# Patient Record
Sex: Female | Born: 1935 | Race: White | Hispanic: No | Smoking: Never smoker
Health system: Southern US, Community
[De-identification: ages and names within clinical notes are randomized; demographics above are authoritative.]

## PROBLEM LIST (undated history)

## (undated) DIAGNOSIS — I499 Cardiac arrhythmia, unspecified: Secondary | ICD-10-CM

## (undated) DIAGNOSIS — Z8719 Personal history of other diseases of the digestive system: Secondary | ICD-10-CM

## (undated) DIAGNOSIS — M87052 Idiopathic aseptic necrosis of left femur: Secondary | ICD-10-CM

## (undated) DIAGNOSIS — D649 Anemia, unspecified: Secondary | ICD-10-CM

## (undated) DIAGNOSIS — E039 Hypothyroidism, unspecified: Secondary | ICD-10-CM

## (undated) DIAGNOSIS — I10 Essential (primary) hypertension: Secondary | ICD-10-CM

## (undated) DIAGNOSIS — M1990 Unspecified osteoarthritis, unspecified site: Secondary | ICD-10-CM

## (undated) DIAGNOSIS — C259 Malignant neoplasm of pancreas, unspecified: Secondary | ICD-10-CM

## (undated) DIAGNOSIS — Z9289 Personal history of other medical treatment: Secondary | ICD-10-CM

## (undated) DIAGNOSIS — I639 Cerebral infarction, unspecified: Secondary | ICD-10-CM

## (undated) HISTORY — PX: EYE SURGERY: SHX253

## (undated) HISTORY — DX: Idiopathic aseptic necrosis of left femur: M87.052

## (undated) HISTORY — PX: OTHER SURGICAL HISTORY: SHX169

## (undated) SURGERY — Surgical Case
Anesthesia: *Unknown

---

## 2001-06-05 DIAGNOSIS — I639 Cerebral infarction, unspecified: Secondary | ICD-10-CM

## 2001-06-05 HISTORY — DX: Cerebral infarction, unspecified: I63.9

## 2002-03-22 ENCOUNTER — Encounter: Payer: Self-pay | Admitting: *Deleted

## 2002-03-22 ENCOUNTER — Inpatient Hospital Stay (HOSPITAL_COMMUNITY): Admission: EM | Admit: 2002-03-22 | Discharge: 2002-03-26 | Payer: Self-pay | Admitting: *Deleted

## 2002-03-24 ENCOUNTER — Encounter: Payer: Self-pay | Admitting: *Deleted

## 2002-04-01 ENCOUNTER — Encounter (HOSPITAL_COMMUNITY): Admission: RE | Admit: 2002-04-01 | Discharge: 2002-05-01 | Payer: Self-pay | Admitting: Internal Medicine

## 2002-09-01 ENCOUNTER — Ambulatory Visit (HOSPITAL_COMMUNITY): Admission: RE | Admit: 2002-09-01 | Discharge: 2002-09-01 | Payer: Self-pay | Admitting: Ophthalmology

## 2005-01-27 ENCOUNTER — Emergency Department (HOSPITAL_COMMUNITY): Admission: EM | Admit: 2005-01-27 | Discharge: 2005-01-27 | Payer: Self-pay | Admitting: Emergency Medicine

## 2006-09-10 ENCOUNTER — Encounter (HOSPITAL_COMMUNITY): Admission: RE | Admit: 2006-09-10 | Discharge: 2006-10-10 | Payer: Self-pay | Admitting: Pulmonary Disease

## 2006-09-11 ENCOUNTER — Ambulatory Visit (HOSPITAL_COMMUNITY): Payer: Self-pay | Admitting: Pulmonary Disease

## 2006-10-16 ENCOUNTER — Ambulatory Visit: Payer: Self-pay | Admitting: Internal Medicine

## 2006-10-31 ENCOUNTER — Encounter: Payer: Self-pay | Admitting: Internal Medicine

## 2006-10-31 ENCOUNTER — Ambulatory Visit (HOSPITAL_COMMUNITY): Admission: RE | Admit: 2006-10-31 | Discharge: 2006-10-31 | Payer: Self-pay | Admitting: Internal Medicine

## 2006-10-31 ENCOUNTER — Ambulatory Visit: Payer: Self-pay | Admitting: Internal Medicine

## 2006-11-08 ENCOUNTER — Ambulatory Visit (HOSPITAL_COMMUNITY): Admission: RE | Admit: 2006-11-08 | Discharge: 2006-11-08 | Payer: Self-pay | Admitting: Internal Medicine

## 2006-11-16 ENCOUNTER — Ambulatory Visit: Payer: Self-pay | Admitting: Internal Medicine

## 2006-12-27 ENCOUNTER — Ambulatory Visit: Payer: Self-pay | Admitting: Internal Medicine

## 2007-06-14 ENCOUNTER — Ambulatory Visit (HOSPITAL_COMMUNITY): Admission: RE | Admit: 2007-06-14 | Discharge: 2007-06-14 | Payer: Self-pay | Admitting: Pulmonary Disease

## 2010-10-18 NOTE — Op Note (Signed)
NAME:  Regina [name], Regina [name]              ACCOUNT NO.:  [id number]   MEDICAL RECORD NO.:  [id number]          PATIENT TYPE:  AMB   LOCATION:  DAY                           FACILITY:  [facility]   PHYSICIAN:  R. Roetta Sessions, M.D. DATE OF BIRTH:  [birth year]   DATE OF PROCEDURE:  10/31/2006  DATE OF DISCHARGE:                               OPERATIVE REPORT   PROCEDURE:  Colonoscopy, biopsy followed by EGD diagnostic.   INDICATIONS FOR PROCEDURE:  77 year old lady with profound microcytic  anemia.  She is Hemoccult positive.  She has been transfused.  There is  been no melena or hematochezia.  She has not had any hematemesis.  Colonoscopy is now being done, if unremarkable EGD, will be performed.  This approach has discussed the patient at length.  Potential risks,  benefits and alternatives have been reviewed, questions answered.  Please see documentation medical record.   PROCEDURE NOTE:  O2 saturation, blood pressure, pulse and monitored  throughout the entire procedure.   CONSCIOUS SEDATION:  Versed 4 mg IV Demerol 100 mg IV in divided doses  for both procedures.   INSTRUMENTATION:  Pentax video chip system.   Digital rectal exam revealed no abnormalities.   ENDOSCOPIC FINDINGS:  The prep was adequate.  Colon:  Colonic mucosa was surveyed from rectosigmoid junction through  the left, transverse, right colon to area appendiceal orifice, ileocecal  valve and cecum.  These structures well seen photographed for the  record.  I attempted to intubate the terminal ileum was unable to do so.  From this level scope was slowly withdrawn.  All previous mentioned  mucosal surfaces were again seen.  The patient had a densely populated  diverticula from the rectosigmoid junction to the cecum.  She had  complex tics (tics within tics).  The scope was pulled down into the  rectum where thorough examination of rectal mucosa including retroflex  view anal verge revealed only internal hemorrhoids and  diminutive 3 mm  polyp at the rectosigmoid junction.  Remainder of the colonic mucosa and  rectal mucosa appeared normal.  This polyp was cold biopsy slash  removed.  The patient tolerated the procedure was prepared for upper  endoscopy.   FINDINGS:  EGD demonstrated normal esophageal mucosa.  EG junction  easily traversed.  Stomach:  Gastric cavity was emptied insufflated well with air.  Thorough examination gastric mucosa including retroflex view of the  proximal stomach esophagogastric junction demonstrated a very large  hiatal hernia only.  Pylorus patent, easily traversed.  Examination  bulb, second portion revealed no abnormalities.   THERAPEUTIC/DIAGNOSTIC MANEUVERS PERFORMED:  None.   The patient tolerated procedure well was reacted in endoscopy.   IMPRESSION:  Colonoscopy findings:  Internal hemorrhoids diminutive,  rectosigmoid polyp cold biopsy/removed.  The remainder rectal mucosa  appeared normal.  Densely populated pan colonic diverticula.  Remainder  colonic mucosa appeared normal.   EGD findings:  Normal esophagus, large large hiatal hernia otherwise  normal stomach D1, D2.   RECOMMENDATIONS:  Will proceed with a Givens capsule study of small  bowel near future.  Further recommendations to follow.  Jonathon Bellows, M.D.  Electronically Signed     RMR/MEDQ  D:  10/31/2006  T:  10/31/2006  Job:  [id number]   cc:   Ramon Dredge L. Juanetta Gosling, M.D.  Fax: [phone]

## 2010-10-18 NOTE — Op Note (Signed)
NAME:  Regina [name], Regina [name]              ACCOUNT NO.:  [id number]   MEDICAL RECORD NO.:  [id number]          PATIENT TYPE:  AMB   LOCATION:  DAY                           FACILITY:  [facility]   PHYSICIAN:  R. Roetta Sessions, M.D. DATE OF BIRTH:  1922/09/08   DATE OF PROCEDURE:  11/08/2006  DATE OF DISCHARGE:                                PROCEDURE NOTE   PROCEDURE:  Small bowel Given capsule endoscopy.   REQUESTING PHYSICIAN:  R. Roetta Sessions, M.D.   INDICATIONS FOR PROCEDURE:  The patient is an 77 year old female with  profound microcytic anemia and Hemoccult positive stools requiring  transfusion.  There was no history of melena, hematochezia or  hematemesis.  She underwent colonoscopy and EGD by Dr. Jena Gauss on  10/31/2006.  She was found to have internal hemorrhoids which were  diminutive, a rectosigmoid polyp which was cold biopsied and  hyperplastic.  The remainder of the rectal mucosa appeared normal.  She  had densely populated pancolonic diverticula and the remainder of the  colonic mucosa appeared normal.  EGD showed a normal esophagus, large  hiatal hernia, otherwise normal D1 and T2.  She is undergoing further  evaluation with Given capsule study to determine etiology of her iron  deficiency anemia and Hemoccult positive stool.   FINDINGS:  Gastric passage time was 31 minutes with first duodenal image  at 32 minutes 16 seconds.  At 32 minutes 23 seconds she is found to have  multiple lymphoid aggregates at the duodenal bulb.  At 32 minutes 29  seconds, lymphoid aggregates with small amount of erythema, no active  bleeding.  At 2 hours 8 minutes 21 seconds, she was noted to have  lymphangiectasia.  Again at 2 hours 29 minutes 52 seconds, 2 hours 31  minutes 15 seconds, 2 hours 52 minutes, 2 hours 56 minutes, 3 hours 16  minutes.  First ileocecal valve image is 5 hours 5 minutes 34 seconds,  and first cecal image is at 5 hours 14 minutes 18 seconds.  Total small  bowel passage time  was 4 hours 42 minutes.  There was no evidence of  active GI bleeding on the study.   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  Multiple lymphangiectasia  throughout the small bowel.  Otherwise normal appearing mucosa.  These  findings are unlikely to explain her iron deficiency anemia and  Hemoccult positive stool.  Portions of this small bowel study were  reviewed with Dr. Jena Gauss.   PLAN:  1. Would recheck CBC to see where we stand as far as her anemia is      concerned.  2. Followup office visit with [place] in 4 to 6 weeks.  3. Would begin iron supplementation in the form of ferrous sulfate      once daily if she remains with significant anemia.      Lorenza Burton, N.P.      Jonathon Bellows, M.D.  Electronically Signed    KJ/MEDQ  D:  11/16/2006  T:  11/16/2006  Job:  [id number]   cc:  Edward L. Luan Pulling, M.D.  Fax: [phone]

## 2010-10-18 NOTE — H&P (Signed)
NAME:  Regina, [name]              ACCOUNT NO.:  [id number]   MEDICAL RECORD NO.:  [id number]           PATIENT TYPE:  AMB   LOCATION:                                FACILITY:  [facility]   PHYSICIAN:  R. Roetta Sessions, M.D. DATE OF BIRTH:  May 04, 1923   DATE OF ADMISSION:  DATE OF DISCHARGE:  LH                              HISTORY & PHYSICAL   REASON FOR CONSULTATION:  Iron deficiency anemia.   [name] is a pleasant 77 year old Caucasian female sent over  at the courtesy of Dr. Shaune Pollack to further evaluate profound iron  deficiency anemia.  Apparently during recent routine evaluation she was  found to be anemic.  From April 7 she was found to have an H&H of 7.9  and 23.6 with an MCV of 59.3, platelet count 372.  She has received 1  unit of packed red blood cells since that time and feels much better.  She was feeling progressively fatigued.  Iron studies revealed an iron-  binding capacity that was not calculable.  Her serum iron level was less  than 10, her ferritin was 5, B12 level was 467, folate level 15.1.  Ms.  Regina [name] has not had any clinical evidence of GI bleeding.  She has had no  hematochezia, melena, no change in bowel habit.  She had some vague  upper abdominal pain for 3 days in 2022/08/01 which passed on its own.  Has not had any reflux symptoms,odynophagia, dysphagia, early satiety,  nausea, vomiting or weight loss.  She had been on Plavix for years.  This agent was recently.  She had been on Fosamax since the first of the  year.  This agent has now also been stopped.  She has never had her  lower GI tract imaged.  Her brother died with stomach cancer.   PAST MEDICAL HISTORY:  Significant for a stroke in 2003, hypertension,  hyperlipidemia.   PAST SURGERIES:  Cataract surgery.   CURRENT MEDICATIONS:  Norvasc and Toprol-XL, doses unknown.   ALLERGIES:  No known drug allergies.   FAMILY HISTORY:  Mother and father succumbed to heart disease.  Brother  succumbed to stomach cancer.   SOCIAL HISTORY:  The patient is married.  She lives on [address] in  [place].  She has two children in good health.  She is a retired  housewife.  No tobacco.  Occasionally uses alcohol socially.  No illicit  drugs.   REVIEW OF SYSTEMS:  No chest pain.  Has had some dyspnea and fatigued  improved with the packed red blood cell transfusion recently.  Has not  had any fever or chills.   PHYSICAL EXAMINATION:  GENERAL:  Reveals a pleasant, somewhat pale 83-  year lady resting comfortably.  VITAL SIGNS:  Weight 155, height 5 feet 5 inches, temperature 97.8, BP  142/80, pulse 58.  SKIN:  Warm and dry.  There is no jaundice or cutaneous stigmata of  chronic liver disease.  HEENT:  No scleral icterus.  Conjunctivae are somewhat pale.  Oral  cavity:  No lesions.  CHEST:  Lungs are  clear to auscultation.  CARDIAC:  Regular rate and rhythm without murmur, gallop rub.  BREAST:  Exam is deferred.  ABDOMEN:  Somewhat obese, positive bowel sounds, soft, entirely  nontender without appreciable mass or organomegaly.  EXTREMITIES:  No edema.  RECTAL:  Good sphincter tone.  No mass in the rectal vault.  Brown stool  in the rectal vault is strongly Hemoccult positive.   IMPRESSION:  [name] is a pleasant 77 year old lady who  appears have a profound microcytic anemia.  She is hemoccult positive.  This nice lady needs to have a colonoscopy as soon as can be arranged.  I am concerned she could have an occult neoplasm in her colon producing  the clinical scenario.   It is certainly possible that Fosamax added to her preexisting Plavix  therapy could have produced mucosal insult in her upper GI tract which  could be contributing to the clinical picture, but aside from 3 days of  self-limiting abdominal pain back in February of this year she really  has not had any symptoms referable to the upper GI tract.   I told [name] we ought to go ahead and do  a colonoscopy.  The  risks, benefits and alternatives have been reviewed, questions answered.  She is agreeable.  I told her if we do not find a cause for iron  deficiency anemia and Hemoccult positive stool on colonoscopy we will  then perform an EGD at the same time.  Potential risks, benefits and  alternative of this approach have been reviewed and she is also  agreeable.  Will make further recommendations in the very near future.   I would like to thank Dr. Shaune Pollack for allowing me to see this very  nice lady today.      Jonathon Bellows, M.D.  Electronically Signed     RMR/MEDQ  D:  10/16/2006  T:  10/16/2006  Job:  [id number]   cc:   Ramon Dredge L. Juanetta Gosling, M.D.  Fax: [phone]

## 2010-10-18 NOTE — Assessment & Plan Note (Signed)
NAME:  KRISTA, GODSIL               CHART#:  [id number]   DATE:  12/27/2006                       DOB:  1922-08-16   CHIEF COMPLAINT:  Followup anemia.   SUBJECTIVE:  Ms. Hartinger is an 77 year old female who has a history of  profound microcytic anemia and Hemoccult positive stools requiring  transfusions. She underwent GIVENS capsule endoscopy on November 08, 2006.  She was found to have multiple lymphangiectasias, but otherwise normal  appearing mucosa. There were no findings likely to explain her iron  deficiency anemia and Hemoccult positive stool. She had a repeat CBC  done on November 20, 2006. The hemoglobin was up to 11.3 with a hematocrit  of 36.5. She tells me she is feeling fine. She denies any fatigue. She  denies any shortness of breath, denies any melena, or rectal bleeding.  She has been off Plavix since September 08, 2006.   CURRENT MEDICATIONS:  See the list from December 27, 2006.   ALLERGIES:  No known drug allergies.   OBJECTIVE:  VITAL SIGNS: Weight 156 pounds, height 65 inches,  temperature 97.9, blood pressure 140/82, pulse 56.  GENERAL: Ms. Bellavance is a well-developed, well-nourished elderly female  in no acute distress.  HEENT: Throat clear, eyes anicteric, conjunctivae pink. Oropharynx pink  and moist without any lesions.  CHEST/HEART: Regular rate and rhythm, normal S1, S2.  ABDOMEN: Positive bowel sounds x4, no bruits auscultated. Soft,  nontender, nondistended, without palpable mass, or hepatosplenomegaly.  No rebound, tenderness, or guarding.  EXTREMITIES: Without clubbing or edema bilaterally.   ASSESSMENT:  Ms. Tiedt is an 77 year old female with history of  profound microcytic anemia and Hemoccult positive stools requiring  transfusion. She had an EGD and colonoscopy by Dr. Jena Gauss October 01, 2006.  She had pancolonic diverticula, a large hiatal hernia, and hemorrhoids.  GIVENS capsule study was benign and no evidence of bleeding was  observed. She had previously  been on Plavix which may have led to  intermittent bleeding.  Since she has been off, the work-up has been  benign. I have discussed this case with Dr. Jena Gauss.   PLAN:  We will recheck her hemoglobin in 4 weeks.  If it is normal we will see if Dr. Juanetta Gosling wants to resume Plavix and he  can determine dosing.  She will need to be continued to be monitored to be sure that she is  stable while she is on Plavix.       Lorenza Burton, N.P.  Electronically Signed     R. Roetta Sessions, M.D.  Electronically Signed    KJ/MEDQ  D:  12/28/2006  T:  12/28/2006  Job:  [id number]   cc:   Ramon Dredge L. Juanetta Gosling, M.D.

## 2010-10-21 NOTE — Discharge Summary (Signed)
   NAME:  Regina [name], Regina [name]                        ACCOUNT NO.:  [id number]   MEDICAL RECORD NO.:  [id number]                   PATIENT TYPE:  INP   LOCATION:  A213                                 FACILITY:  [facility]   PHYSICIAN:  Cristy Hilts. Jacinto Halim, M.D.                  DATE OF BIRTH:  02-25-1923   DATE OF ADMISSION:  03/22/2002  DATE OF DISCHARGE:                                 DISCHARGE SUMMARY   PROCEDURE PERFORMED:  Doppler echocardiogram.   INDICATION:  Stroke and hypertension.   FINDINGS:  Aortic valve 2.7, left atrium 4.0, septum 1.4, posterior 1.3,  left ventricular diastolic dimension 3.2, left ventricular systolic  dimension 2.7, right ventricular dimension normal, IVC normal, ejection  fraction 60%.   LEFT ATRIUM:  The left atrium shows mild atrial enlargement.   LEFT VENTRICLE:  The left ventricle shows moderate concentric left  ventricular hypertrophy.   RIGHT ATRIUM:  The right atrium is normal.  A prominent eustachian wall  valve is noted, impact is normal structure.   RIGHT VENTRICLE:  The right ventricle is normal.   PERICARDIUM:  The pericardium is normal.   AORTIC VALVE:  The aortic valve is normal.   MITRAL VALVE:  The mitral valve is normal.  The is mild mitral valve  calcification.  There is trivial mitral valve regurgitation.   PULMONARY VALVE:  The pulmonary valve is normal.  There is trivial pulmonary  valve regurgitation.   TRICUSPID VALVE:  The tricuspid valve is normal.  There is mild tricuspid  valve regurgitation.  The pulmonary systolic pressure is estimated at 31  mm/Hg.    FINDINGS:  1. Normal left ventricular systolic function, ejection fraction 60%.  2. Moderate concentric left ventricular hypertrophy.  3. Mild pulmonary hypertension with pulmonary systolic pressure estimated at     31 mm/Hg.                                               Cristy Hilts. Jacinto Halim, M.D.    Pilar Plate  D:  03/24/2002  T:  03/25/2002  Job:  [id number]   cc:   Gracelyn Nurse, M.D.   [facility]

## 2010-10-21 NOTE — Discharge Summary (Signed)
   NAME:  Regina [name], Regina [name]                        ACCOUNT NO.:  [id number]   MEDICAL RECORD NO.:  [id number]                   PATIENT TYPE:  INP   LOCATION:  A213                                 FACILITY:  [facility]   PHYSICIAN:  Hanley Hays. Dechurch, M.D.           DATE OF BIRTH:  Nov 02, 1922   DATE OF ADMISSION:  03/22/2002  DATE OF DISCHARGE:  03/26/2002                                 DISCHARGE SUMMARY   DIAGNOSES:  1. Left corona radiata subacute infarction; residual right hemiparesis,     upper extremity greater than lower extremity, and mild dysarthria.  2. Hypertension, uncontrolled.  3. Urinary tract infection.  4. Small vessel disease.  5. Obesity.  6. Probable diabetes.  7. Cholesterol 241.   CONDITION:  Improved.   FOLLOW UP:  Follow up Dr. Milus Glazier October 28.  Outpatient OT, PT, speech  also arranged for the patient.   MEDICATIONS:  1. Norvasc 10 daily.  2. Aspirin 325 daily.  3. Plavix 75 daily.  4. Toprol XL 50 daily.   HOSPITAL COURSE:  A 77 year old white female who presented to the hospital  with right hemiparesis and dysarthria.  She was also noted to be  hypertensive.  She had not been on any medications.  She did not have a  regular physician.  She had not seen a physician since 1984 when she  fractured her ankle.  The patient's CT scan revealed no acute findings but  MR did reveal a subacute corona radiata infarction.  The issues were  discussed at length with the patient regarding need to control her chronic  medical problems in order to prevent further events.  She seemed to have a  reasonable understanding and followup was arranged for the patient.  The  patient was in improved condition at the time of hospital discharge.  Dysarthria was barely discernible at this time.  She had no  evidence of aphagia.  She had some mild right upper extremity greater than  lower extremity weakness.  This too had improved.  There were no other  significant findings.   Blood pressures were well controlled in the 140s and  further management would be continued as an outpatient.                                               Hanley Hays Josefine Class, M.D.    FED/MEDQ  D:  05/30/2002  T:  05/31/2002  Job:  [id number]   cc:   Hanley Hays. Dechurch, M.D.  [address]  [place]  [place] [address]  Fax: [phone]

## 2010-10-21 NOTE — H&P (Signed)
NAME:  Regina [name], Regina [name]                        ACCOUNT NO.:  [id number]   MEDICAL RECORD NO.:  [id number]                   PATIENT TYPE:  INP   LOCATION:  IC09                                 FACILITY:  [facility]   PHYSICIAN:  Sarita Bottom, M.D.                  DATE OF BIRTH:  01-03-1923   DATE OF ADMISSION:  03/22/2002  DATE OF DISCHARGE:                                HISTORY & PHYSICAL   REASON FOR ADMISSION:  Ischemic cerebrovascular accident, uncontrolled  hypertension, urinary tract infection..   CHIEF COMPLAINT:  My speech is slurred.   HISTORY OF PRESENT ILLNESS:  The patient is a 77 year old lady with no known  medical problems.  Was apparently well until two days ago when she noticed  general malaise.  She also felt weakness in the right arm the day after  that, and she developed a fever, and she also noticed slurring of her speech  about 11 a.m. this morning.  After that she took a tablet of aspirin and  decided to come to the emergency room of [facility] for evaluation.   REVIEW OF SYSTEMS:  GENERAL:  The patient admits to malaise.  Admits to  feeling feverish.  RESPIRATORY:  She denies any cough.  CARDIOVASCULAR:  Admits to palpitations sometimes.  ABDOMEN:  Denies any diarrhea or  constipation.  GENITOURINARY:  She denies any dysuria.  CNS:  She admits to  having right-sided weakness.  Denies dizziness.  MUSCULOSKELETAL:  Denies  any back pain.  EXTREMITIES:  Denies any edema.   PAST MEDICAL HISTORY:  Ankle fracture in 1984, and she has not seen an M.D.  since 1984.   MEDICATIONS:  She is not on any long-term medications.  She took a tablet of  aspirin today.   ALLERGIES:  No known drug allergies.   FAMILY HISTORY:  Significant for CHF in both parents and a CVA in her  mother.   SOCIAL HISTORY:  She is married, with two grown-up children.  She lives with  her husband.  She does not smoke.  She stopped smoking so many years ago.  She drinks wine  frequently with dinner.   PHYSICAL EXAMINATION:  VITAL SIGNS:  BP was 188/74 with a heart rate of 77,  temperature afebrile, oxygen saturation on room air was 97%.  GENERAL:  Elderly lady sitting up on a stretcher, not in any apparent  distress.  HEENT:  The patient is not pale.  Anicteric.  Pupils are equal and reactive  to light and accommodation.  She has flattening of the right nasolabial  fold.  NECK:  Supple.  No thyromegaly.  No carotid bruits are appreciated.  CHEST:  Air entry was adequate bilaterally.  No wheezes or crepitations were  heard.  CARDIOVASCULAR:  Heart sounds 1 and 2 were heard.  Regular rhythm.  No  murmurs were appreciated.  ABDOMEN:  Soft and nontender.  No masses or organomegaly.  CNS:  Alert and oriented x 3.  She has normal tone in all extremities.  The  power is 5/5 in the left upper and left lower extremities, about 3+/5 in her  right upper extremity, and 3/5 in her right lower extremity.  The reflexes  appear to be depressed on the right side.  Her sensation was intact.  Cranial nerves were intact.  EXTREMITIES:  She did not have any pedal edema.   LABORATORY DATA:  Head CT showed no acute bleed.  Showed atrophy with mild  changes of small-vessel disease.   WBC 8.1 with normal differential, hematocrit of 41, hemoglobin of 13.8.  PT  4.7, PTT 27, INR of 1.0.  Sodium of 137, potassium of 4.1, chloride of 103,  CO2 of 27, BUN of 12, creatinine of 1.0, glucose of 117, calcium of 9.  Cardiac enzymes showed a CK of 115, MB fraction of 2.8, troponin of 0.01.  Urine was significant for leukocyte esterase, wbc's of 7-10, rbc's of 3-6,  protein positive.   EKG was normal sinus rhythm at 79 beats per minute, leftward axis.  Multiple  PVCs were seen.  Normal interval.  No acute ST or T-wave changes.   ASSESSMENT:  Ischemic cerebrovascular accident.  The patient is admitted to  intensive care unit for observation.  The patient is not eligible for  thrombolytic  therapy at this time.  Because of that ____________.  Also,  anticoagulation cannot be given because of the uncontrolled hypertension.  The patient will be started on aspirin 325 mg p.o. q.d., Plavix 75 mg q.d.  Carotid Doppler bilaterally will be done, and a 2-D echo was to be done.  A  neurological consult will be sought for evaluation.  For the uncontrolled  hypertension the patient will be treated with metoprolol 50 mg b.i.d.  For  the urinary tract infection the patient will be started on Bactrim 1 double-  strength tablet b.i.d. for five to seven days.  Further workup will depend  on the patient's clinical course.                                               Sarita Bottom, M.D.    DW/MEDQ  D:  03/22/2002  T:  03/23/2002  Job:  [id number]

## 2010-10-21 NOTE — Procedures (Signed)
   NAME:  Regina [name], Regina [name]                        ACCOUNT NO.:  [id number]   MEDICAL RECORD NO.:  [id number]                   PATIENT TYPE:  INP   LOCATION:  A213                                 FACILITY:  [facility]   PHYSICIAN:  Edward L. Juanetta Gosling, M.D.             DATE OF BIRTH:  Dec 30, 1922   DATE OF PROCEDURE:  03/22/2002  DATE OF DISCHARGE:                                EKG INTERPRETATION   TIME/DATE:  1730, March 22, 2002.   DESCRIPTION OF PROCEDURE:  The rhythm is a sinus rhythm with a rate in the  80s.  There are multiple PVCs.  There are diffuse nonspecific T-wave  abnormalities.   IMPRESSION:  Abnormal electrocardiogram.                                                Edward L. Juanetta Gosling, M.D.    ELH/MEDQ  D:  03/24/2002  T:  03/24/2002  Job:  [id number]

## 2011-07-20 DIAGNOSIS — I6789 Other cerebrovascular disease: Secondary | ICD-10-CM | POA: Diagnosis not present

## 2011-07-20 DIAGNOSIS — E785 Hyperlipidemia, unspecified: Secondary | ICD-10-CM | POA: Diagnosis not present

## 2011-07-20 DIAGNOSIS — D649 Anemia, unspecified: Secondary | ICD-10-CM | POA: Diagnosis not present

## 2011-07-20 DIAGNOSIS — I10 Essential (primary) hypertension: Secondary | ICD-10-CM | POA: Diagnosis not present

## 2012-01-17 DIAGNOSIS — D649 Anemia, unspecified: Secondary | ICD-10-CM | POA: Diagnosis not present

## 2012-01-17 DIAGNOSIS — I10 Essential (primary) hypertension: Secondary | ICD-10-CM | POA: Diagnosis not present

## 2012-01-17 DIAGNOSIS — I6789 Other cerebrovascular disease: Secondary | ICD-10-CM | POA: Diagnosis not present

## 2012-01-17 DIAGNOSIS — H6120 Impacted cerumen, unspecified ear: Secondary | ICD-10-CM | POA: Diagnosis not present

## 2012-08-02 DIAGNOSIS — E785 Hyperlipidemia, unspecified: Secondary | ICD-10-CM | POA: Diagnosis not present

## 2012-08-02 DIAGNOSIS — I10 Essential (primary) hypertension: Secondary | ICD-10-CM | POA: Diagnosis not present

## 2012-08-02 DIAGNOSIS — G459 Transient cerebral ischemic attack, unspecified: Secondary | ICD-10-CM | POA: Diagnosis not present

## 2012-08-06 ENCOUNTER — Other Ambulatory Visit (HOSPITAL_COMMUNITY): Payer: Self-pay | Admitting: Pulmonary Disease

## 2012-08-06 ENCOUNTER — Ambulatory Visit (HOSPITAL_COMMUNITY)
Admission: RE | Admit: 2012-08-06 | Discharge: 2012-08-06 | Disposition: A | Discharge status: Home / Self Care | Payer: Medicare Other | Source: Ambulatory Visit | Attending: Pulmonary Disease | Admitting: Pulmonary Disease

## 2012-08-06 DIAGNOSIS — I700 Atherosclerosis of aorta: Secondary | ICD-10-CM | POA: Diagnosis not present

## 2012-08-06 DIAGNOSIS — K449 Diaphragmatic hernia without obstruction or gangrene: Secondary | ICD-10-CM | POA: Diagnosis not present

## 2012-08-06 DIAGNOSIS — J449 Chronic obstructive pulmonary disease, unspecified: Secondary | ICD-10-CM | POA: Diagnosis not present

## 2012-08-06 DIAGNOSIS — R509 Fever, unspecified: Secondary | ICD-10-CM

## 2012-08-06 DIAGNOSIS — J9819 Other pulmonary collapse: Secondary | ICD-10-CM | POA: Diagnosis not present

## 2012-08-06 DIAGNOSIS — Z8673 Personal history of transient ischemic attack (TIA), and cerebral infarction without residual deficits: Secondary | ICD-10-CM | POA: Insufficient documentation

## 2012-08-06 DIAGNOSIS — J4489 Other specified chronic obstructive pulmonary disease: Secondary | ICD-10-CM | POA: Insufficient documentation

## 2012-08-06 DIAGNOSIS — R110 Nausea: Secondary | ICD-10-CM | POA: Insufficient documentation

## 2012-08-06 DIAGNOSIS — J438 Other emphysema: Secondary | ICD-10-CM | POA: Diagnosis not present

## 2012-08-29 DIAGNOSIS — D649 Anemia, unspecified: Secondary | ICD-10-CM | POA: Diagnosis not present

## 2012-08-29 DIAGNOSIS — I10 Essential (primary) hypertension: Secondary | ICD-10-CM | POA: Diagnosis not present

## 2012-08-29 DIAGNOSIS — R6889 Other general symptoms and signs: Secondary | ICD-10-CM | POA: Diagnosis not present

## 2012-08-29 DIAGNOSIS — R634 Abnormal weight loss: Secondary | ICD-10-CM | POA: Diagnosis not present

## 2012-09-03 DIAGNOSIS — I499 Cardiac arrhythmia, unspecified: Secondary | ICD-10-CM

## 2012-09-03 HISTORY — DX: Cardiac arrhythmia, unspecified: I49.9

## 2012-09-05 ENCOUNTER — Other Ambulatory Visit (HOSPITAL_COMMUNITY): Payer: Self-pay | Admitting: Pulmonary Disease

## 2012-09-05 DIAGNOSIS — R945 Abnormal results of liver function studies: Secondary | ICD-10-CM

## 2012-09-05 DIAGNOSIS — R7989 Other specified abnormal findings of blood chemistry: Secondary | ICD-10-CM | POA: Diagnosis not present

## 2012-09-05 DIAGNOSIS — E039 Hypothyroidism, unspecified: Secondary | ICD-10-CM | POA: Diagnosis not present

## 2012-09-05 DIAGNOSIS — E875 Hyperkalemia: Secondary | ICD-10-CM | POA: Diagnosis not present

## 2012-09-10 ENCOUNTER — Ambulatory Visit (HOSPITAL_COMMUNITY)
Admission: RE | Admit: 2012-09-10 | Discharge: 2012-09-10 | Disposition: A | Discharge status: Home / Self Care | Payer: Medicare Other | Source: Ambulatory Visit | Attending: Pulmonary Disease | Admitting: Pulmonary Disease

## 2012-09-10 DIAGNOSIS — R945 Abnormal results of liver function studies: Secondary | ICD-10-CM

## 2012-09-10 DIAGNOSIS — K8020 Calculus of gallbladder without cholecystitis without obstruction: Secondary | ICD-10-CM | POA: Insufficient documentation

## 2012-09-10 DIAGNOSIS — R933 Abnormal findings on diagnostic imaging of other parts of digestive tract: Secondary | ICD-10-CM | POA: Insufficient documentation

## 2012-09-10 DIAGNOSIS — R748 Abnormal levels of other serum enzymes: Secondary | ICD-10-CM | POA: Insufficient documentation

## 2012-09-10 DIAGNOSIS — K838 Other specified diseases of biliary tract: Secondary | ICD-10-CM | POA: Insufficient documentation

## 2012-09-12 ENCOUNTER — Encounter (HOSPITAL_COMMUNITY): Payer: Self-pay | Admitting: Emergency Medicine

## 2012-09-12 ENCOUNTER — Inpatient Hospital Stay (HOSPITAL_COMMUNITY)
Admission: EM | Admit: 2012-09-12 | Discharge: 2012-09-18 | DRG: 481 | Disposition: A | Discharge status: Skilled Nursing Facility | Payer: Medicare Other | Attending: Pulmonary Disease | Admitting: Pulmonary Disease

## 2012-09-12 ENCOUNTER — Emergency Department (HOSPITAL_COMMUNITY): Payer: Medicare Other

## 2012-09-12 DIAGNOSIS — R6889 Other general symptoms and signs: Secondary | ICD-10-CM | POA: Diagnosis not present

## 2012-09-12 DIAGNOSIS — S72009A Fracture of unspecified part of neck of unspecified femur, initial encounter for closed fracture: Principal | ICD-10-CM | POA: Diagnosis present

## 2012-09-12 DIAGNOSIS — D509 Iron deficiency anemia, unspecified: Secondary | ICD-10-CM | POA: Diagnosis present

## 2012-09-12 DIAGNOSIS — Y92009 Unspecified place in unspecified non-institutional (private) residence as the place of occurrence of the external cause: Secondary | ICD-10-CM

## 2012-09-12 DIAGNOSIS — I4891 Unspecified atrial fibrillation: Secondary | ICD-10-CM | POA: Diagnosis not present

## 2012-09-12 DIAGNOSIS — M25559 Pain in unspecified hip: Secondary | ICD-10-CM | POA: Diagnosis not present

## 2012-09-12 DIAGNOSIS — E039 Hypothyroidism, unspecified: Secondary | ICD-10-CM | POA: Diagnosis present

## 2012-09-12 DIAGNOSIS — E119 Type 2 diabetes mellitus without complications: Secondary | ICD-10-CM | POA: Diagnosis not present

## 2012-09-12 DIAGNOSIS — Z8673 Personal history of transient ischemic attack (TIA), and cerebral infarction without residual deficits: Secondary | ICD-10-CM

## 2012-09-12 DIAGNOSIS — Z9181 History of falling: Secondary | ICD-10-CM | POA: Diagnosis not present

## 2012-09-12 DIAGNOSIS — S298XXA Other specified injuries of thorax, initial encounter: Secondary | ICD-10-CM | POA: Diagnosis not present

## 2012-09-12 DIAGNOSIS — Z800 Family history of malignant neoplasm of digestive organs: Secondary | ICD-10-CM | POA: Diagnosis not present

## 2012-09-12 DIAGNOSIS — I10 Essential (primary) hypertension: Secondary | ICD-10-CM | POA: Diagnosis not present

## 2012-09-12 DIAGNOSIS — R262 Difficulty in walking, not elsewhere classified: Secondary | ICD-10-CM | POA: Diagnosis not present

## 2012-09-12 DIAGNOSIS — S72009D Fracture of unspecified part of neck of unspecified femur, subsequent encounter for closed fracture with routine healing: Secondary | ICD-10-CM | POA: Diagnosis not present

## 2012-09-12 DIAGNOSIS — I4892 Unspecified atrial flutter: Secondary | ICD-10-CM | POA: Diagnosis not present

## 2012-09-12 DIAGNOSIS — E876 Hypokalemia: Secondary | ICD-10-CM | POA: Diagnosis present

## 2012-09-12 DIAGNOSIS — R7309 Other abnormal glucose: Secondary | ICD-10-CM | POA: Diagnosis present

## 2012-09-12 DIAGNOSIS — S72002A Fracture of unspecified part of neck of left femur, initial encounter for closed fracture: Secondary | ICD-10-CM

## 2012-09-12 DIAGNOSIS — M6281 Muscle weakness (generalized): Secondary | ICD-10-CM | POA: Diagnosis not present

## 2012-09-12 DIAGNOSIS — Z79899 Other long term (current) drug therapy: Secondary | ICD-10-CM | POA: Diagnosis not present

## 2012-09-12 DIAGNOSIS — R279 Unspecified lack of coordination: Secondary | ICD-10-CM | POA: Diagnosis not present

## 2012-09-12 DIAGNOSIS — Z8249 Family history of ischemic heart disease and other diseases of the circulatory system: Secondary | ICD-10-CM

## 2012-09-12 DIAGNOSIS — W010XXA Fall on same level from slipping, tripping and stumbling without subsequent striking against object, initial encounter: Secondary | ICD-10-CM | POA: Diagnosis present

## 2012-09-12 DIAGNOSIS — I484 Atypical atrial flutter: Secondary | ICD-10-CM

## 2012-09-12 DIAGNOSIS — I493 Ventricular premature depolarization: Secondary | ICD-10-CM

## 2012-09-12 DIAGNOSIS — I369 Nonrheumatic tricuspid valve disorder, unspecified: Secondary | ICD-10-CM | POA: Diagnosis not present

## 2012-09-12 DIAGNOSIS — R739 Hyperglycemia, unspecified: Secondary | ICD-10-CM

## 2012-09-12 DIAGNOSIS — I4949 Other premature depolarization: Secondary | ICD-10-CM | POA: Diagnosis not present

## 2012-09-12 HISTORY — DX: Hypothyroidism, unspecified: E03.9

## 2012-09-12 HISTORY — DX: Cerebral infarction, unspecified: I63.9

## 2012-09-12 HISTORY — DX: Essential (primary) hypertension: I10

## 2012-09-12 LAB — CBC WITH DIFFERENTIAL/PLATELET
Basophils Absolute: 0 10*3/uL (ref 0.0–0.1)
Basophils Relative: 0 % (ref 0–1)
Eosinophils Absolute: 0 10*3/uL (ref 0.0–0.7)
Eosinophils Relative: 0 % (ref 0–5)
HCT: 37.9 % (ref 36.0–46.0)
MCHC: 33.5 g/dL (ref 30.0–36.0)
MCV: 89.6 fL (ref 78.0–100.0)
Monocytes Absolute: 0.6 10*3/uL (ref 0.1–1.0)
Neutro Abs: 7.3 10*3/uL (ref 1.7–7.7)
RDW: 15.2 % (ref 11.5–15.5)

## 2012-09-12 LAB — HEPATIC FUNCTION PANEL
AST: 31 U/L (ref 0–37)
Albumin: 2.9 g/dL — ABNORMAL LOW (ref 3.5–5.2)
Total Bilirubin: 1.1 mg/dL (ref 0.3–1.2)
Total Protein: 7 g/dL (ref 6.0–8.3)

## 2012-09-12 LAB — URINALYSIS, ROUTINE W REFLEX MICROSCOPIC
Glucose, UA: NEGATIVE mg/dL
Leukocytes, UA: NEGATIVE
Nitrite: NEGATIVE
Protein, ur: 100 mg/dL — AB
pH: 6 (ref 5.0–8.0)

## 2012-09-12 LAB — BASIC METABOLIC PANEL
Calcium: 8.7 mg/dL (ref 8.4–10.5)
Creatinine, Ser: 0.7 mg/dL (ref 0.50–1.10)
GFR calc Af Amer: 88 mL/min — ABNORMAL LOW (ref >90)
GFR calc non Af Amer: 76 mL/min — ABNORMAL LOW (ref >90)

## 2012-09-12 LAB — PROTIME-INR: Prothrombin Time: 14.1 seconds (ref 11.6–15.2)

## 2012-09-12 LAB — URINE MICROSCOPIC-ADD ON

## 2012-09-12 MED ORDER — MORPHINE SULFATE 2 MG/ML IJ SOLN
2.0000 mg | INTRAMUSCULAR | Status: DC | PRN
  Administered 2012-09-13 – 2012-09-14 (×5): 2 mg via INTRAVENOUS
  Administered 2012-09-15: 1 mg via INTRAVENOUS
  Administered 2012-09-15: 2 mg via INTRAVENOUS
  Filled 2012-09-12 (×7): qty 1

## 2012-09-12 MED ORDER — HYDROCODONE-ACETAMINOPHEN 5-325 MG PO TABS: 1.0000 | ORAL_TABLET | ORAL | Status: AC | PRN

## 2012-09-12 MED ORDER — AMLODIPINE BESYLATE 5 MG PO TABS
10.0000 mg | ORAL_TABLET | Freq: Every day | ORAL | Status: DC
  Administered 2012-09-12: 10 mg via ORAL
  Filled 2012-09-12: qty 2

## 2012-09-12 MED ORDER — MORPHINE SULFATE 2 MG/ML IJ SOLN: 0.5000 mg | INTRAMUSCULAR | Status: DC | PRN

## 2012-09-12 MED ORDER — PANTOPRAZOLE SODIUM 40 MG PO TBEC
40.0000 mg | DELAYED_RELEASE_TABLET | Freq: Every day | ORAL | Status: DC
  Administered 2012-09-14 – 2012-09-18 (×5): 40 mg via ORAL
  Filled 2012-09-12 (×5): qty 1

## 2012-09-12 MED ORDER — POVIDONE-IODINE 10 % EX SOLN
Freq: Once | CUTANEOUS | Status: AC
  Administered 2012-09-13: via TOPICAL
  Filled 2012-09-12: qty 118

## 2012-09-12 MED ORDER — CEFAZOLIN SODIUM-DEXTROSE 2-3 GM-% IV SOLR
2.0000 g | INTRAVENOUS | Status: DC
  Filled 2012-09-12: qty 50

## 2012-09-12 MED ORDER — POTASSIUM CHLORIDE 10 MEQ/100ML IV SOLN
10.0000 meq | INTRAVENOUS | Status: AC
  Administered 2012-09-12 – 2012-09-13 (×4): 10 meq via INTRAVENOUS
  Filled 2012-09-12 (×4): qty 100

## 2012-09-12 MED ORDER — ONDANSETRON HCL 4 MG/2ML IJ SOLN
4.0000 mg | Freq: Three times a day (TID) | INTRAMUSCULAR | Status: AC | PRN
  Filled 2012-09-12: qty 2

## 2012-09-12 MED ORDER — METOPROLOL TARTRATE 50 MG PO TABS
50.0000 mg | ORAL_TABLET | Freq: Every day | ORAL | Status: DC
  Administered 2012-09-12 – 2012-09-13 (×2): 50 mg via ORAL
  Filled 2012-09-12 (×2): qty 1

## 2012-09-12 MED ORDER — MORPHINE SULFATE 4 MG/ML IJ SOLN
4.0000 mg | Freq: Once | INTRAMUSCULAR | Status: AC
  Administered 2012-09-12: 4 mg via INTRAVENOUS
  Filled 2012-09-12: qty 1

## 2012-09-12 MED ORDER — HYDROCODONE-ACETAMINOPHEN 5-325 MG PO TABS: 1.0000 | ORAL_TABLET | ORAL | Status: DC | PRN

## 2012-09-12 MED ORDER — LEVOTHYROXINE SODIUM 50 MCG PO TABS
50.0000 ug | ORAL_TABLET | Freq: Every day | ORAL | Status: DC
  Administered 2012-09-13 – 2012-09-18 (×6): 50 ug via ORAL
  Filled 2012-09-12: qty 2
  Filled 2012-09-12: qty 1
  Filled 2012-09-12: qty 2
  Filled 2012-09-12: qty 1
  Filled 2012-09-12 (×2): qty 2

## 2012-09-12 MED ORDER — POTASSIUM CHLORIDE 10 MEQ/100ML IV SOLN
10.0000 meq | Freq: Once | INTRAVENOUS | Status: AC
  Administered 2012-09-12: 10 meq via INTRAVENOUS
  Filled 2012-09-12: qty 100

## 2012-09-12 MED ORDER — ONDANSETRON HCL 4 MG/2ML IJ SOLN
4.0000 mg | Freq: Four times a day (QID) | INTRAMUSCULAR | Status: DC | PRN
  Administered 2012-09-12 – 2012-09-13 (×2): 4 mg via INTRAVENOUS
  Filled 2012-09-12: qty 2

## 2012-09-12 NOTE — ED Notes (Signed)
CRITICAL VALUE ALERT  Critical value received:  Potassium 2.7  Date of notification:  09/12/2012  Time of notification:  1538  Critical value read back:yes  Nurse who received alert:  Duncan Dull RN  MD notified (1st page):  Dr Preston Fleeting  Time of first page: 1538  MD notified (2nd page):  Time of second page:  Responding MD:  Dr Preston Fleeting  Time MD responded:  [phone]

## 2012-09-12 NOTE — Consult Note (Signed)
Reason for Consult:Fracture of the left hip Referring Physician: ER  Regina [name] is an 79 y.o. female.  HPI: She fell at home today over a slipper and hurt her left hip.   She was unable to stand.  X-rays show a left femoral neck fracture.  She has no other injury.  I have discussed with the patient and her family about the fracture and need for surgery.  I have recommended Asnis hip pinning.  I have told them if the fracture should "slip" she would need a hip prosthesis.  She could have avascular necrosis later and need the hip prosthesis.  She is alert and active and I think a good candidate for the hip pinning.  I have discussed with them the risks and imponderables including infection, embolus which could result in death, need for physical therapy and a walker for about three months, possible need for blood transfusion and I have recommended spinal anesthesia.  They asked appropriate questions.  They appear to understand the procedure. I have recommended skilled nursing home after surgery.  She has a low potassium.  She states that a month or so ago she had high potassium.  This will need to be corrected prior to surgery.  She has history of hypertension.  Dr. Juanetta Gosling is her primary care doctor.  Past Medical History  Diagnosis Date  . Stroke   . Hypertension     Past Surgical History  Procedure Laterality Date  . Eye surgery    . Mouth sugery      Upper teeth impacted    History reviewed. No pertinent family history.  Social History:  reports that she has never smoked. She has never used smokeless tobacco. She reports that she does not drink alcohol or use illicit drugs.  Allergies: No Known Allergies  Medications: I have reviewed the patient's current medications.  Results for orders placed during the hospital encounter of 09/12/12 (from the past 48 hour(s))  CBC WITH DIFFERENTIAL     Status: Abnormal   Collection Time    09/12/12  2:42 PM      Result Value Range    WBC 8.5  4.0 - 10.5 K/uL   RBC 4.23  3.87 - 5.11 MIL/uL   Hemoglobin 12.7  12.0 - 15.0 g/dL   HCT 16.1  09.6 - 04.5 %   MCV 89.6  78.0 - 100.0 fL   MCH 30.0  26.0 - 34.0 pg   MCHC 33.5  30.0 - 36.0 g/dL   RDW 40.9  81.1 - 91.4 %   Platelets 256  150 - 400 K/uL   Neutrophils Relative 85 (*) 43 - 77 %   Neutro Abs 7.3  1.7 - 7.7 K/uL   Lymphocytes Relative 8 (*) 12 - 46 %   Lymphs Abs 0.7  0.7 - 4.0 K/uL   Monocytes Relative 7  3 - 12 %   Monocytes Absolute 0.6  0.1 - 1.0 K/uL   Eosinophils Relative 0  0 - 5 %   Eosinophils Absolute 0.0  0.0 - 0.7 K/uL   Basophils Relative 0  0 - 1 %   Basophils Absolute 0.0  0.0 - 0.1 K/uL  BASIC METABOLIC PANEL     Status: Abnormal   Collection Time    09/12/12  2:42 PM      Result Value Range   Sodium 135  135 - 145 mEq/L   Potassium 2.7 (*) 3.5 - 5.1 mEq/L   Comment: CRITICAL RESULT CALLED  TO, READ BACK BY AND VERIFIED WITH:     LAMBERT,B. AT 1535 ON 09/12/2012 BY BAUGHAM,M.   Chloride 96  96 - 112 mEq/L   CO2 29  19 - 32 mEq/L   Glucose, Bld 186 (*) 70 - 99 mg/dL   BUN 16  6 - 23 mg/dL   Creatinine, Ser 1.61  0.50 - 1.10 mg/dL   Calcium 8.7  8.4 - 09.6 mg/dL   GFR calc non Af Amer 76 (*) >90 mL/min   GFR calc Af Amer 88 (*) >90 mL/min   Comment:            The eGFR has been calculated     using the CKD EPI equation.     This calculation has not been     validated in all clinical     situations.     eGFR's persistently     <90 mL/min signify     possible Chronic Kidney Disease.  PROTIME-INR     Status: None   Collection Time    09/12/12  2:42 PM      Result Value Range   Prothrombin Time 14.1  11.6 - 15.2 seconds   INR 1.10  0.00 - 1.49  APTT     Status: None   Collection Time    09/12/12  2:42 PM      Result Value Range   aPTT 34  24 - 37 seconds  TYPE AND SCREEN     Status: None   Collection Time    09/12/12  2:43 PM      Result Value Range   ABO/RH(D) A POS     Antibody Screen NEG     Sample Expiration 09/15/2012     URINALYSIS, ROUTINE W REFLEX MICROSCOPIC     Status: Abnormal   Collection Time    09/12/12  4:07 PM      Result Value Range   Color, Urine YELLOW  YELLOW   APPearance CLEAR  CLEAR   Specific Gravity, Urine >1.030 (*) 1.005 - 1.030   pH 6.0  5.0 - 8.0   Glucose, UA NEGATIVE  NEGATIVE mg/dL   Hgb urine dipstick NEGATIVE  NEGATIVE   Bilirubin Urine SMALL (*) NEGATIVE   Ketones, ur 15 (*) NEGATIVE mg/dL   Protein, ur 045 (*) NEGATIVE mg/dL   Urobilinogen, UA 2.0 (*) 0.0 - 1.0 mg/dL   Nitrite NEGATIVE  NEGATIVE   Leukocytes, UA NEGATIVE  NEGATIVE  URINE MICROSCOPIC-ADD ON     Status: Abnormal   Collection Time    09/12/12  4:07 PM      Result Value Range   WBC, UA 0-2  <3 WBC/hpf   RBC / HPF 0-2  <3 RBC/hpf   Bacteria, UA FEW (*) RARE    Dg Chest 1 View  09/12/2012  *RADIOLOGY REPORT*  Clinical Data: History of fall.  CHEST - 1 VIEW  Comparison: 08/06/2012  Findings: Supine view of the chest was obtained.  There are retrocardiac densities suggestive for a large hiatal hernia.  Heart size is upper limits normal.  Coarse lung markings appear chronic. No evidence for a pneumothorax.  No focal chest disease. Mild deformity of the proximal left humerus is suggestive for an old fracture.  IMPRESSION: Chronic lung changes without acute findings.  Hiatal hernia.   Original Report Authenticated By: Richarda Overlie, M.D.    Dg Hip Complete Left  09/12/2012  *RADIOLOGY REPORT*  Clinical Data: Fall and hip pain.  LEFT HIP -  COMPLETE 2+ VIEW  Comparison: None.  Findings: There is a mildly displaced fracture of the left femoral neck.  Fracture may be mildly comminuted.  The pelvic bony ring is intact.  The left hip is located.  IMPRESSION: Mildly displaced fracture of the left femoral neck.   Original Report Authenticated By: Richarda Overlie, M.D.     Review of Systems  Gastrointestinal:       History of GI bleed late 2003 or 2004   Musculoskeletal: Positive for falls (Fell over slipper at home today.  No  other injury.  ).  Neurological:       History of right sided weakness from CVA in 2003.  Maybe very slight residual weakness.  All other systems reviewed and are negative.   Blood pressure 142/98, pulse 80, temperature 97.4 F (36.3 C), resp. rate 27, height 5\' 5"  (1.651 m), weight 123 kg (271 lb 2.7 oz), SpO2 95.00%. Physical Exam  Constitutional: She is oriented to person, place, and time. She appears well-developed and well-nourished.  HENT:  Head: Normocephalic and atraumatic.  Eyes: Conjunctivae and EOM are normal. Pupils are equal, round, and reactive to light.  Neck: Normal range of motion. Neck supple.  Cardiovascular: Normal rate, regular rhythm, normal heart sounds and intact distal pulses.   Respiratory: Effort normal and breath sounds normal.  GI: Soft. Bowel sounds are normal.  Musculoskeletal: She exhibits tenderness (Pain left hip to any motion.  Leg lengths equal and properly rotated.).  Neurological: She is alert and oriented to person, place, and time. She has normal reflexes.  Skin: Skin is warm and dry.  Psychiatric: She has a normal mood and affect. Her behavior is normal. Judgment and thought content normal.    Assessment/Plan: Left hip femoral neck fracture  For Asnis hip pinning tomorrow.  NPO after midnight.  Regina [name] 09/12/2012, 5:09 PM

## 2012-09-12 NOTE — ED Provider Notes (Signed)
History    This chart was scribed for Regina Booze, MD by Sofie Rower, ED Scribe. The patient was seen in room APA02/AP[facility]02 and the patient's care was started at 2:33PM.    CSN: [id number]  Arrival date & time 09/12/12  1337   First MD Initiated Contact with Patient 09/12/12 1433      Chief Complaint  Patient presents with  . Hip Pain    (Consider location/radiation/quality/duration/timing/severity/associated sxs/prior treatment) The history is provided by the patient. No language interpreter was used.    Regina [name] is a 79 y.o. female , with a hx of stroke, hypertension, eye and mouth surgery, who presents to the Emergency Department complaining of sudden, progressively worsening, non radiating hip pain located at the left hip, onset today (09/12/12). The pt reports she went to the restroom at 2:20Am this morning, where while in route to return to bed, she suddenly lost her footing and balance, fell, and impacted directly upon her left hip. There was no LOC experienced during the fall episode. The pt received assistance from her husband to return to ambulation after the fall. Furthermore, the pt rates her hip pain at a 3/10 at the present point and time. The pt has not taken any medications PTA to relieve her hip pain. Modifying factors include application of pressure and ambulation which intensifies thes hip pain.  The pt does not smoke or drink alcohol.   PCP is Dr. Juanetta Gosling.    Past Medical History  Diagnosis Date  . Stroke   . Hypertension     Past Surgical History  Procedure Laterality Date  . Eye surgery    . Mouth sugery      Upper teeth impacted    History reviewed. No pertinent family history.  History  Substance Use Topics  . Smoking status: Never Smoker   . Smokeless tobacco: Never Used  . Alcohol Use: No     Comment: None since Christmas 2013    OB History   Grav Para Term Preterm Abortions TAB SAB Ect Mult Living            2      Review of Systems   Musculoskeletal: Positive for arthralgias.  Neurological: Negative for syncope.  All other systems reviewed and are negative.    Allergies  Review of patient's allergies indicates no known allergies.  Home Medications   Current Outpatient Rx  Name  Route  Sig  Dispense  Refill  . amLODipine (NORVASC) 10 MG tablet   Oral   Take 10 mg by mouth daily.         Marland Kitchen aspirin EC 81 MG tablet   Oral   Take 81 mg by mouth daily.         Marland Kitchen levothyroxine (SYNTHROID, LEVOTHROID) 50 MCG tablet   Oral   Take 50 mcg by mouth daily before breakfast.         . metoprolol (LOPRESSOR) 50 MG tablet   Oral   Take 50 mg by mouth daily.           BP 142/98  Pulse 80  Temp(Src) 97.4 F (36.3 C)  Resp 27  Ht 5\' 5"  (1.651 m)  Wt 271 lb 2.7 oz (123 kg)  BMI 45.12 kg/m2  SpO2 95%  Physical Exam  Nursing note and vitals reviewed. Constitutional: She is oriented to person, place, and time. She appears well-developed and well-nourished. No distress.  HENT:  Head: Normocephalic and atraumatic.  Eyes: EOM are  normal. Pupils are equal, round, and reactive to light.  Neck: Neck supple. No tracheal deviation present.  Cardiovascular: Normal rate.  An irregular rhythm present.  Murmur heard.  Systolic murmur is present with a grade of 2/6  Systolic murmer heard at the upper left cysternal boarder.   Pulmonary/Chest: Effort normal. No respiratory distress.  Abdominal: Soft. She exhibits no distension.  Musculoskeletal: Normal range of motion. She exhibits tenderness. She exhibits no edema.       Left hip: She exhibits tenderness.  Left leg shortened. Moderate tenderness at the lateral left hip. Pain with passive ROM at the left hip.   Neurological: She is alert and oriented to person, place, and time. No sensory deficit.  Skin: Skin is warm and dry.  Psychiatric: She has a normal mood and affect. Her behavior is normal.    ED Course  Procedures (including critical care  time)  DIAGNOSTIC STUDIES: Oxygen Saturation is 95% on room air, normal by my interpretation.    COORDINATION OF CARE:  2:39 PM- Treatment plan concerning radiologic evaluation discussed with patient. Pt agrees with treatment.  3:47 PM- Recheck. Treatment plan concerning radiology results discussed with patient. Pt agrees with treatment.        Results for orders placed during the hospital encounter of 09/12/12  CBC WITH DIFFERENTIAL      Result Value Range   WBC 8.5  4.0 - 10.5 K/uL   RBC 4.23  3.87 - 5.11 MIL/uL   Hemoglobin 12.7  12.0 - 15.0 g/dL   HCT 96.0  45.4 - 09.8 %   MCV 89.6  78.0 - 100.0 fL   MCH 30.0  26.0 - 34.0 pg   MCHC 33.5  30.0 - 36.0 g/dL   RDW 11.9  14.7 - 82.9 %   Platelets 256  150 - 400 K/uL   Neutrophils Relative 85 (*) 43 - 77 %   Neutro Abs 7.3  1.7 - 7.7 K/uL   Lymphocytes Relative 8 (*) 12 - 46 %   Lymphs Abs 0.7  0.7 - 4.0 K/uL   Monocytes Relative 7  3 - 12 %   Monocytes Absolute 0.6  0.1 - 1.0 K/uL   Eosinophils Relative 0  0 - 5 %   Eosinophils Absolute 0.0  0.0 - 0.7 K/uL   Basophils Relative 0  0 - 1 %   Basophils Absolute 0.0  0.0 - 0.1 K/uL  BASIC METABOLIC PANEL      Result Value Range   Sodium 135  135 - 145 mEq/L   Potassium 2.7 (*) 3.5 - 5.1 mEq/L   Chloride 96  96 - 112 mEq/L   CO2 29  19 - 32 mEq/L   Glucose, Bld 186 (*) 70 - 99 mg/dL   BUN 16  6 - 23 mg/dL   Creatinine, Ser 5.62  0.50 - 1.10 mg/dL   Calcium 8.7  8.4 - 13.0 mg/dL   GFR calc non Af Amer 76 (*) >90 mL/min   GFR calc Af Amer 88 (*) >90 mL/min  PROTIME-INR      Result Value Range   Prothrombin Time 14.1  11.6 - 15.2 seconds   INR 1.10  0.00 - 1.49  APTT      Result Value Range   aPTT 34  24 - 37 seconds   Dg Chest 1 View  09/12/2012  *RADIOLOGY REPORT*  Clinical Data: History of fall.  CHEST - 1 VIEW  Comparison: 08/06/2012  Findings: Supine view of  the chest was obtained.  There are retrocardiac densities suggestive for a large hiatal hernia.  Heart  size is upper limits normal.  Coarse lung markings appear chronic. No evidence for a pneumothorax.  No focal chest disease. Mild deformity of the proximal left humerus is suggestive for an old fracture.  IMPRESSION: Chronic lung changes without acute findings.  Hiatal hernia.   Original Report Authenticated By: Richarda Overlie, M.D.    Dg Hip Complete Left  09/12/2012  *RADIOLOGY REPORT*  Clinical Data: Fall and hip pain.  LEFT HIP - COMPLETE 2+ VIEW  Comparison: None.  Findings: There is a mildly displaced fracture of the left femoral neck.  Fracture may be mildly comminuted.  The pelvic bony ring is intact.  The left hip is located.  IMPRESSION: Mildly displaced fracture of the left femoral neck.   Original Report Authenticated By: Richarda Overlie, M.D.      Date: 09/12/2012  Rate: 82  Rhythm: normal sinus rhythm and premature ventricular contractions (PVC)  QRS Axis: right  Intervals: PR prolonged  ST/T Wave abnormalities: nonspecific T wave changes  Conduction Disutrbances:first-degree A-V block   Narrative Interpretation: Right axis deviation, multiple PVCs, low voltage, first degree AV block, nonspecific T wave flattening. No prior ECG available for comparison.  Old EKG Reviewed: none available    1. Closed fracture of femur, neck, left, initial encounter   2. Hypokalemia   3. Hyperglycemia       MDM  Fall with possible hip fracture.  X-ray confirms presence of hip fracture. Potassium is come back low and she's given supplemental potassium. Case is been discussed with Dr. Hilda Lias for orthopedics who agrees to see the patient in consultation. Case has been discussed with her PCP, Dr. Juanetta Gosling, who agrees to admit the patient.  I personally performed the services described in this documentation, which was scribed in my presence. The recorded information has been reviewed and is accurate.   Regina Booze, MD 09/12/12 1710

## 2012-09-12 NOTE — ED Notes (Signed)
Pt put on 3 L of 02. 02 sat now reading 94%

## 2012-09-12 NOTE — ED Notes (Signed)
Per EMS, pt reported falling early this morning at 2:00 am. Per EMS pt reported walking out of the bathroom and gliding down the carpet and then her hip gave out. Per EMS pt did not hear a pop or feel a pop when falling. Per EMS pt states she hit her head on the dresser on her way down but did not lose consciousness. Per EMS pt's family convinced the pt to come get evaluated. Per EMS pt rates her pain about a 2 or 3 when sitting. Per EMS pt states a pain of 10 when they moved the pt. EMS applied a pelvic binding with a sheet and taped it down.

## 2012-09-13 ENCOUNTER — Encounter (HOSPITAL_COMMUNITY): Payer: Self-pay | Admitting: *Deleted

## 2012-09-13 ENCOUNTER — Encounter (HOSPITAL_COMMUNITY)
Admission: EM | Disposition: A | Discharge status: Skilled Nursing Facility | Payer: Self-pay | Source: Home / Self Care | Attending: Pulmonary Disease

## 2012-09-13 ENCOUNTER — Inpatient Hospital Stay (HOSPITAL_COMMUNITY): Payer: Medicare Other | Admitting: Anesthesiology

## 2012-09-13 ENCOUNTER — Inpatient Hospital Stay (HOSPITAL_COMMUNITY): Payer: Medicare Other

## 2012-09-13 ENCOUNTER — Encounter (HOSPITAL_COMMUNITY): Payer: Self-pay | Admitting: Anesthesiology

## 2012-09-13 DIAGNOSIS — I10 Essential (primary) hypertension: Secondary | ICD-10-CM | POA: Diagnosis present

## 2012-09-13 DIAGNOSIS — S72002A Fracture of unspecified part of neck of left femur, initial encounter for closed fracture: Secondary | ICD-10-CM

## 2012-09-13 DIAGNOSIS — E039 Hypothyroidism, unspecified: Secondary | ICD-10-CM | POA: Diagnosis present

## 2012-09-13 DIAGNOSIS — S72009A Fracture of unspecified part of neck of unspecified femur, initial encounter for closed fracture: Secondary | ICD-10-CM | POA: Diagnosis not present

## 2012-09-13 HISTORY — PX: HIP PINNING,CANNULATED: SHX1758

## 2012-09-13 LAB — BASIC METABOLIC PANEL
BUN: 12 mg/dL (ref 6–23)
Chloride: 102 mEq/L (ref 96–112)
Creatinine, Ser: 0.74 mg/dL (ref 0.50–1.10)
GFR calc non Af Amer: 74 mL/min — ABNORMAL LOW (ref >90)
Glucose, Bld: 108 mg/dL — ABNORMAL HIGH (ref 70–99)
Potassium: 3.5 mEq/L (ref 3.5–5.1)

## 2012-09-13 LAB — CBC WITH DIFFERENTIAL/PLATELET
Basophils Absolute: 0 10*3/uL (ref 0.0–0.1)
Basophils Relative: 0 % (ref 0–1)
Eosinophils Absolute: 0.2 10*3/uL (ref 0.0–0.7)
Eosinophils Relative: 2 % (ref 0–5)
HCT: 35.2 % — ABNORMAL LOW (ref 36.0–46.0)
Hemoglobin: 11.6 g/dL — ABNORMAL LOW (ref 12.0–15.0)
MCH: 30.1 pg (ref 26.0–34.0)
MCHC: 33 g/dL (ref 30.0–36.0)
Monocytes Absolute: 0.8 10*3/uL (ref 0.1–1.0)
Monocytes Relative: 11 % (ref 3–12)
RDW: 15.5 % (ref 11.5–15.5)

## 2012-09-13 LAB — URINE CULTURE: Colony Count: NO GROWTH

## 2012-09-13 SURGERY — FIXATION, FEMUR, NECK, PERCUTANEOUS, USING SCREW
Anesthesia: Spinal | Site: Hip | Laterality: Left | Wound class: Clean

## 2012-09-13 MED ORDER — SODIUM CHLORIDE 0.9 % IR SOLN
Status: DC | PRN
  Administered 2012-09-13: 1000 mL

## 2012-09-13 MED ORDER — ACETAMINOPHEN 10 MG/ML IV SOLN
1000.0000 mg | Freq: Four times a day (QID) | INTRAVENOUS | Status: AC
  Administered 2012-09-13 – 2012-09-14 (×4): 1000 mg via INTRAVENOUS
  Filled 2012-09-13 (×4): qty 100

## 2012-09-13 MED ORDER — ENOXAPARIN SODIUM 40 MG/0.4ML ~~LOC~~ SOLN
40.0000 mg | SUBCUTANEOUS | Status: DC
  Administered 2012-09-14 – 2012-09-16 (×3): 40 mg via SUBCUTANEOUS
  Filled 2012-09-13 (×3): qty 0.4

## 2012-09-13 MED ORDER — PROPOFOL 10 MG/ML IV EMUL
INTRAVENOUS | Status: AC
  Filled 2012-09-13: qty 20

## 2012-09-13 MED ORDER — EPHEDRINE SULFATE 50 MG/ML IJ SOLN
INTRAMUSCULAR | Status: DC | PRN
  Administered 2012-09-13: 10 mg via INTRAVENOUS
  Administered 2012-09-13 (×2): 5 mg via INTRAVENOUS

## 2012-09-13 MED ORDER — LIDOCAINE HCL (PF) 1 % IJ SOLN
INTRAMUSCULAR | Status: AC
  Filled 2012-09-13: qty 5

## 2012-09-13 MED ORDER — PROMETHAZINE HCL 25 MG/ML IJ SOLN: 12.5000 mg | INTRAMUSCULAR | Status: DC | PRN

## 2012-09-13 MED ORDER — FENTANYL CITRATE 0.05 MG/ML IJ SOLN
INTRAMUSCULAR | Status: AC
  Filled 2012-09-13: qty 2

## 2012-09-13 MED ORDER — FENTANYL CITRATE 0.05 MG/ML IJ SOLN: 25.0000 ug | INTRAMUSCULAR | Status: DC | PRN

## 2012-09-13 MED ORDER — MIDAZOLAM HCL 2 MG/2ML IJ SOLN
1.0000 mg | INTRAMUSCULAR | Status: DC | PRN
  Administered 2012-09-13: 1 mg via INTRAVENOUS

## 2012-09-13 MED ORDER — LACTATED RINGERS IV SOLN
INTRAVENOUS | Status: DC
  Administered 2012-09-13: 11:00:00 via INTRAVENOUS

## 2012-09-13 MED ORDER — FENTANYL CITRATE 0.05 MG/ML IJ SOLN
INTRAMUSCULAR | Status: DC | PRN
  Administered 2012-09-13: 25 ug via INTRAVENOUS

## 2012-09-13 MED ORDER — BUPIVACAINE IN DEXTROSE 0.75-8.25 % IT SOLN
INTRATHECAL | Status: DC | PRN
  Administered 2012-09-13: 12 mg via INTRATHECAL

## 2012-09-13 MED ORDER — MIDAZOLAM HCL 5 MG/5ML IJ SOLN
INTRAMUSCULAR | Status: DC | PRN
  Administered 2012-09-13: 1 mg via INTRAVENOUS

## 2012-09-13 MED ORDER — MAGNESIUM HYDROXIDE 400 MG/5ML PO SUSP
30.0000 mL | Freq: Every day | ORAL | Status: DC | PRN
  Administered 2012-09-15 – 2012-09-16 (×2): 30 mL via ORAL
  Filled 2012-09-13 (×2): qty 30

## 2012-09-13 MED ORDER — ALBUTEROL SULFATE (5 MG/ML) 0.5% IN NEBU
2.5000 mg | INHALATION_SOLUTION | Freq: Four times a day (QID) | RESPIRATORY_TRACT | Status: AC
  Administered 2012-09-13 – 2012-09-16 (×9): 2.5 mg via RESPIRATORY_TRACT
  Filled 2012-09-13 (×9): qty 0.5

## 2012-09-13 MED ORDER — CEFAZOLIN SODIUM-DEXTROSE 2-3 GM-% IV SOLR
INTRAVENOUS | Status: AC
  Filled 2012-09-13: qty 50

## 2012-09-13 MED ORDER — MIDAZOLAM HCL 2 MG/2ML IJ SOLN
INTRAMUSCULAR | Status: AC
  Filled 2012-09-13: qty 2

## 2012-09-13 MED ORDER — ONDANSETRON HCL 4 MG/2ML IJ SOLN: 4.0000 mg | Freq: Once | INTRAMUSCULAR | Status: DC | PRN

## 2012-09-13 MED ORDER — BUPIVACAINE IN DEXTROSE 0.75-8.25 % IT SOLN
INTRATHECAL | Status: AC
  Filled 2012-09-13: qty 2

## 2012-09-13 MED ORDER — ACETAMINOPHEN 325 MG PO TABS
650.0000 mg | ORAL_TABLET | Freq: Four times a day (QID) | ORAL | Status: DC | PRN
  Administered 2012-09-16: 650 mg via ORAL
  Filled 2012-09-13: qty 2

## 2012-09-13 MED ORDER — GLYCOPYRROLATE 0.2 MG/ML IJ SOLN
INTRAMUSCULAR | Status: DC | PRN
  Administered 2012-09-13: 0.2 mg via INTRAVENOUS

## 2012-09-13 MED ORDER — CEFAZOLIN SODIUM-DEXTROSE 2-3 GM-% IV SOLR
INTRAVENOUS | Status: DC | PRN
  Administered 2012-09-13: 2 g via INTRAVENOUS

## 2012-09-13 SURGICAL SUPPLY — 49 items
BAG HAMPER (MISCELLANEOUS) ×2 IMPLANT
BIT DRILL 4.9 CANNULATED (BIT) ×1
BIT DRILL CANN QC 4.9 LRG (BIT) ×1 IMPLANT
BLADE SURG SZ10 CARB STEEL (BLADE) ×2 IMPLANT
CLOTH BEACON ORANGE TIMEOUT ST (SAFETY) ×2 IMPLANT
COVER LIGHT HANDLE STERIS (MISCELLANEOUS) ×4 IMPLANT
COVER MAYO STAND XLG (DRAPE) ×2 IMPLANT
DRAPE STERI IOBAN 125X83 (DRAPES) ×2 IMPLANT
DRILL BIT CANNULATED 4.9 (BIT) ×1
EVACUATOR 3/16  PVC DRAIN (DRAIN) ×1
EVACUATOR 3/16 PVC DRAIN (DRAIN) ×1 IMPLANT
GAUZE KERLIX 2  STERILE LF (GAUZE/BANDAGES/DRESSINGS) ×2 IMPLANT
GAUZE XEROFORM 5X9 LF (GAUZE/BANDAGES/DRESSINGS) ×2 IMPLANT
GLOVE BIO SURGEON STRL SZ8 (GLOVE) ×2 IMPLANT
GLOVE BIO SURGEON STRL SZ8.5 (GLOVE) ×2 IMPLANT
GLOVE BIOGEL PI IND STRL 7.0 (GLOVE) ×2 IMPLANT
GLOVE BIOGEL PI IND STRL 7.5 (GLOVE) ×1 IMPLANT
GLOVE BIOGEL PI IND STRL 8 (GLOVE) ×1 IMPLANT
GLOVE BIOGEL PI INDICATOR 7.0 (GLOVE) ×2
GLOVE BIOGEL PI INDICATOR 7.5 (GLOVE) ×1
GLOVE BIOGEL PI INDICATOR 8 (GLOVE) ×1
GLOVE ECLIPSE 6.5 STRL STRAW (GLOVE) ×4 IMPLANT
GLOVE ECLIPSE 7.0 STRL STRAW (GLOVE) ×2 IMPLANT
GOWN STRL REIN XL XLG (GOWN DISPOSABLE) ×8 IMPLANT
GUIDEWIRE CALB ASNIS (WIRE) ×6 IMPLANT
INST SET MAJOR BONE (KITS) ×2 IMPLANT
KIT BLADEGUARD II DBL (SET/KITS/TRAYS/PACK) ×2 IMPLANT
KIT ROOM TURNOVER APOR (KITS) ×2 IMPLANT
MANIFOLD NEPTUNE II (INSTRUMENTS) ×2 IMPLANT
MARKER SKIN DUAL TIP RULER LAB (MISCELLANEOUS) ×2 IMPLANT
NS IRRIG 1000ML POUR BTL (IV SOLUTION) ×2 IMPLANT
PACK BASIC III (CUSTOM PROCEDURE TRAY) ×1
PACK SRG BSC III STRL LF ECLPS (CUSTOM PROCEDURE TRAY) ×1 IMPLANT
PAD ABD 5X9 TENDERSORB (GAUZE/BANDAGES/DRESSINGS) ×2 IMPLANT
PENCIL HANDSWITCHING (ELECTRODE) ×2 IMPLANT
SCREW ASNIS 85MM (Screw) ×2 IMPLANT
SCREW ASNIS 90MM (Screw) ×2 IMPLANT
SCREW CANN 6.5X80 STRL (Screw) ×2 IMPLANT
SET BASIN LINEN APH (SET/KITS/TRAYS/PACK) ×2 IMPLANT
SPONGE DRAIN TRACH 4X4 STRL 2S (GAUZE/BANDAGES/DRESSINGS) ×2 IMPLANT
SPONGE GAUZE 4X4 12PLY (GAUZE/BANDAGES/DRESSINGS) ×2 IMPLANT
SPONGE LAP 18X18 X RAY DECT (DISPOSABLE) ×2 IMPLANT
STAPLER VISISTAT 35W (STAPLE) ×2 IMPLANT
SUT BRALON NAB BRD #1 30IN (SUTURE) ×2 IMPLANT
SUT PLAIN 2 0 XLH (SUTURE) ×2 IMPLANT
SUT SILK 0 FSL (SUTURE) ×2 IMPLANT
SYR BULB IRRIGATION 50ML (SYRINGE) ×2 IMPLANT
TAPE MEDIFIX FOAM 3 (GAUZE/BANDAGES/DRESSINGS) ×2 IMPLANT
YANKAUER SUCT 12FT TUBE ARGYLE (SUCTIONS) ×2 IMPLANT

## 2012-09-13 NOTE — Anesthesia Preprocedure Evaluation (Signed)
Anesthesia Evaluation  Patient identified by MRN, date of birth, ID band Patient awake    Reviewed: Allergy & Precautions, H&P , NPO status , Patient's Chart, lab work & pertinent test results  Airway Mallampati: I TM Distance: >3 FB Neck ROM: Full    Dental  (+) Partial Upper and Partial Lower   Pulmonary neg pulmonary ROS,  breath sounds clear to auscultation        Cardiovascular hypertension, Pt. on medications Rhythm:Regular Rate:Normal     Neuro/Psych CVA (slight R weakness), Residual Symptoms    GI/Hepatic   Endo/Other  Hypothyroidism   Renal/GU      Musculoskeletal   Abdominal   Peds  Hematology   Anesthesia Other Findings   Reproductive/Obstetrics                           Anesthesia Physical Anesthesia Plan  ASA: III  Anesthesia Plan: Spinal   Post-op Pain Management:    Induction:   Airway Management Planned: Nasal Cannula  Additional Equipment:   Intra-op Plan:   Post-operative Plan:   Informed Consent: I have reviewed the patients History and Physical, chart, labs and discussed the procedure including the risks, benefits and alternatives for the proposed anesthesia with the patient or authorized representative who has indicated his/her understanding and acceptance.     Plan Discussed with:   Anesthesia Plan Comments:         Anesthesia Quick Evaluation

## 2012-09-13 NOTE — Progress Notes (Signed)
Pulse ox hr and ecg monitor hr often do not match , Sat questionable. Pt appears comfortable on 5lpm/[place].

## 2012-09-13 NOTE — Brief Op Note (Signed)
09/12/2012 - 09/13/2012  12:58 PM  PATIENT:  Regina [name]  79 y.o. female  PRE-OPERATIVE DIAGNOSIS:  left hip fracture femoral neck   POST-OPERATIVE DIAGNOSIS:  left hip fracture femoral neck  PROCEDURE:  Procedure(s): ASNIS HIP PINNING LEFT (Left)  SURGEON:  Surgeon(s) and Role:    * Darreld Mclean, MD - Primary  PHYSICIAN ASSISTANT:   ASSISTANTS: [place], RN   ANESTHESIA:   spinal  EBL:  Total I/O In: 500 [I.V.:500] Out: -   BLOOD ADMINISTERED:none  DRAINS: (Large) Hemovact drain(s) in the left hip area with  Suction Open   LOCAL MEDICATIONS USED:  NONE  SPECIMEN:  No Specimen  DISPOSITION OF SPECIMEN:  N/A  COUNTS:  YES  TOURNIQUET:  * No tourniquets in log *  DICTATION: .Other Dictation: Dictation Number [id number]  PLAN OF CARE: Admit to inpatient   PATIENT DISPOSITION:  PACU - hemodynamically stable.   Delay start of Pharmacological VTE agent (>24hrs) due to surgical blood loss or risk of bleeding: no

## 2012-09-13 NOTE — Care Management Note (Signed)
    Page 1 of 1   09/18/2012     11:20:56 AM   CARE MANAGEMENT NOTE 09/18/2012  Patient:  Regina [name],Regina [name]   Account Number:  [id number]  Date Initiated:  09/13/2012  Documentation initiated by:  Sharrie Rothman  Subjective/Objective Assessment:   Pt admitted from home with hip fracture. Pt lives with spouse. Pt having surgery to fix hip so PT will assess pts discharge disposition post op day 1.     Action/Plan:   Will continue to follow for [facility] needs or possible SNF placement. CSW is aware of pt.   Anticipated DC Date:  09/09/2012   Anticipated DC Plan:  SKILLED NURSING FACILITY  In-house referral  Clinical Social Worker      DC Planning Services  CM consult      Choice offered to / List presented to:             Status of service:  Completed, signed off Medicare Important Message given?  YES (If response is "NO", the following Medicare IM given date fields will be blank) Date Medicare IM given:  09/18/2012 Date Additional Medicare IM given:    Discharge Disposition:  SKILLED NURSING FACILITY  Per UR Regulation:    If discussed at Long Length of Stay Meetings, dates discussed:   09/17/2012    Comments:  09/18/12 1115 Arlyss Queen, RN BSN CM Pt discharged to [facility] center today. CSW to arrange discharge to facility.  09/16/12 1030 Arlyss Queen, RN BSN CM PT recommends SNF at discharge. CSW is aware and will initiate bed search. Pt potentially ready for discharge 09/17/12.  09/13/12 1355 Arlyss Queen, RN BSN CM

## 2012-09-13 NOTE — H&P (Signed)
Regina [name], Regina [name]              ACCOUNT NO.:  [id number]  MEDICAL RECORD NO.:  [id number]  LOCATION:  A325                          FACILITY:  [facility]  PHYSICIAN:  Nikisha Fleece L. Juanetta Gosling, M.D.DATE OF BIRTH:  22-Apr-1923  DATE OF ADMISSION:  09/12/2012 DATE OF DISCHARGE:  [facility]                             HISTORY & PHYSICAL   REASON FOR ADMISSION:  Hip fracture.  HISTORY OF PRESENT ILLNESS:  Regina [name] is an 79 year old, who said that she tripped on her slipper and hurt her left hip.  She was not able to stand and was found to have a left femoral neck fracture on x-ray.  She did not have any other injury.  PAST MEDICAL HISTORY:  Positive for stroke and hypertension.  PAST SURGICAL HISTORY:  Surgically, she has had eye surgery and mouth surgery.  SOCIAL HISTORY:  She does not smoke.  She has never smoked.  She does not use any alcohol or illicit drugs.  She is married, lives at home with her husband.  FAMILY HISTORY:  Not positive for any sort of osteoporosis, etc.  REVIEW OF SYSTEMS:  She has had a recent episode of abdominal discomfort, nausea, vomiting, and weight loss, but that has essentially resolved.  She has also been recently found to be hypothyroid.  MEDICATIONS:  At home are amlodipine 10 mg daily, aspirin 81 mg daily, Synthroid 50 mcg daily, and metoprolol 50 mg daily.  REVIEW OF SYSTEMS:  Except as mentioned is negative.  PHYSICAL EXAMINATION:  VITAL SIGNS:  Her temperature is 97.8, pulse 80, respirations 20, blood pressure 114/65, and O2 sats 97%. HEENT:  Pupils are reactive.  Nose and throat are clear.  Mucous membranes are moist. NECK:  Supple without masses. CHEST:  Clear. HEART:  Regular without gallop. ABDOMEN:  Soft without masses.  Bowel sounds are present and active. EXTREMITIES:  She has what appears to be a left hip fracture. CENTRAL NERVOUS SYSTEM:  Grossly intact.  LABORATORY DATA:  White blood count 8500, hemoglobin 12.7, platelets 256.  Her potassium  is 2.7.  ASSESSMENT:  She has a fractured hip.  She has hypokalemia and that will need to be replaced.  She has hypertension and she will be on her antihypertensive.  She has a new diagnosis of hypothyroidism and that is going to be treated.  Consultation has been obtained with Dr.  Hilda Lias.     Raeghan Demeter L. Juanetta Gosling, M.D.     ELH/MEDQ  D:  09/12/2012  T:  09/13/2012  Job:  [id number]

## 2012-09-13 NOTE — Anesthesia Postprocedure Evaluation (Signed)
  Anesthesia Post-op Note  Patient: Regina [name]  Procedure(s) Performed: Procedure(s): ASNIS HIP PINNING LEFT (Left)  Patient Location: PACU  Anesthesia Type:Spinal  Level of Consciousness: awake, alert , oriented and patient cooperative  Airway and Oxygen Therapy: Patient Spontanous Breathing  Post-op Pain: 3 /10, mild  Post-op Assessment: Post-op Vital signs reviewed, Patient's Cardiovascular Status Stable, Respiratory Function Stable, Patent Airway and Pain level controlled  Post-op Vital Signs: Reviewed and stable  Complications: No apparent anesthesia complications

## 2012-09-13 NOTE — Transfer of Care (Signed)
Immediate Anesthesia Transfer of Care Note  Patient: Regina [name]  Procedure(s) Performed: Procedure(s): ASNIS HIP PINNING LEFT (Left)  Patient Location: PACU  Anesthesia Type:Spinal  Level of Consciousness: awake and patient cooperative  Airway & Oxygen Therapy: Patient Spontanous Breathing and Patient connected to face mask oxygen  Post-op Assessment: Report given to PACU RN and Post -op Vital signs reviewed and stable  Post vital signs: Reviewed and stable  Complications: No apparent anesthesia complications

## 2012-09-13 NOTE — Progress Notes (Signed)
Subjective: She says she feels pretty well. She has no other new complaints.  Objective: Vital signs in last 24 hours: Temp:  [97.4 F (36.3 C)-97.9 F (36.6 C)] 97.8 F (36.6 C) (04/11 0421) Pulse Rate:  [80-84] 84 (04/10 2107) Resp:  [20-27] 20 (04/11 0421) BP: (109-142)/(55-98) 109/55 mmHg (04/11 0421) SpO2:  [92 %-97 %] 92 % (04/11 0421) Weight:  [123 kg (271 lb 2.7 oz)] 123 kg (271 lb 2.7 oz) (04/10 1336) Weight change:     Intake/Output from previous day: 04/10 0701 - 04/11 0700 In: 0  Out: 850 [Urine:850]  PHYSICAL EXAM General appearance: alert, cooperative and mild distress Resp: clear to auscultation bilaterally Cardio: regular rate and rhythm, S1, S2 normal, no murmur, click, rub or gallop GI: soft, non-tender; bowel sounds normal; no masses,  no organomegaly Extremities: Fractured left hip  Lab Results:    Basic Metabolic Panel:  Recent Labs  16/10/96 1442 09/13/12 0550  NA 135 140  K 2.7* 3.5  CL 96 102  CO2 29 28  GLUCOSE 186* 108*  BUN 16 12  CREATININE 0.70 0.74  CALCIUM 8.7 8.2*   Liver Function Tests:  Recent Labs  09/12/12 1545  AST 31  ALT 23  ALKPHOS 483*  BILITOT 1.1  PROT 7.0  ALBUMIN 2.9*   No results found for this basename: LIPASE, AMYLASE,  in the last 72 hours No results found for this basename: AMMONIA,  in the last 72 hours CBC:  Recent Labs  09/12/12 1442 09/13/12 0550  WBC 8.5 7.6  NEUTROABS 7.3 5.8  HGB 12.7 11.6*  HCT 37.9 35.2*  MCV 89.6 91.4  PLT 256 240   Cardiac Enzymes: No results found for this basename: CKTOTAL, CKMB, CKMBINDEX, TROPONINI,  in the last 72 hours BNP: No results found for this basename: PROBNP,  in the last 72 hours D-Dimer: No results found for this basename: DDIMER,  in the last 72 hours CBG: No results found for this basename: GLUCAP,  in the last 72 hours Hemoglobin A1C: No results found for this basename: HGBA1C,  in the last 72 hours Fasting Lipid Panel: No results found  for this basename: CHOL, HDL, LDLCALC, TRIG, CHOLHDL, LDLDIRECT,  in the last 72 hours Thyroid Function Tests: No results found for this basename: TSH, T4TOTAL, FREET4, T3FREE, THYROIDAB,  in the last 72 hours Anemia Panel: No results found for this basename: VITAMINB12, FOLATE, FERRITIN, TIBC, IRON, RETICCTPCT,  in the last 72 hours Coagulation:  Recent Labs  09/12/12 1442  LABPROT 14.1  INR 1.10   Urine Drug Screen: Drugs of Abuse  No results found for this basename: labopia, cocainscrnur, labbenz, amphetmu, thcu, labbarb    Alcohol Level: No results found for this basename: ETH,  in the last 72 hours Urinalysis:  Recent Labs  09/12/12 1607  COLORURINE YELLOW  LABSPEC >1.030*  PHURINE 6.0  GLUCOSEU NEGATIVE  HGBUR NEGATIVE  BILIRUBINUR SMALL*  KETONESUR 15*  PROTEINUR 100*  UROBILINOGEN 2.0*  NITRITE NEGATIVE  LEUKOCYTESUR NEGATIVE   Misc. Labs:  ABGS No results found for this basename: PHART, PCO2, PO2ART, TCO2, HCO3,  in the last 72 hours CULTURES Recent Results (from the past 240 hour(s))  SURGICAL PCR SCREEN     Status: None   Collection Time    09/12/12 10:29 PM      Result Value Range Status   MRSA, PCR NEGATIVE  NEGATIVE Final   Staphylococcus aureus NEGATIVE  NEGATIVE Final   Comment:  The Xpert SA Assay (FDA     approved for NASAL specimens     in patients over 42 years of age),     is one component of     a comprehensive surveillance     program.  Test performance has     been validated by [employer] for patients greater     than or equal to 50 year old.     It is not intended     to diagnose infection nor to     guide or monitor treatment.   Studies/Results: Dg Chest 1 View  09/12/2012  *RADIOLOGY REPORT*  Clinical Data: History of fall.  CHEST - 1 VIEW  Comparison: 08/06/2012  Findings: Supine view of the chest was obtained.  There are retrocardiac densities suggestive for a large hiatal hernia.  Heart size is upper limits  normal.  Coarse lung markings appear chronic. No evidence for a pneumothorax.  No focal chest disease. Mild deformity of the proximal left humerus is suggestive for an old fracture.  IMPRESSION: Chronic lung changes without acute findings.  Hiatal hernia.   Original Report Authenticated By: Richarda Overlie, M.D.    Dg Hip Complete Left  09/12/2012  *RADIOLOGY REPORT*  Clinical Data: Fall and hip pain.  LEFT HIP - COMPLETE 2+ VIEW  Comparison: None.  Findings: There is a mildly displaced fracture of the left femoral neck.  Fracture may be mildly comminuted.  The pelvic bony ring is intact.  The left hip is located.  IMPRESSION: Mildly displaced fracture of the left femoral neck.   Original Report Authenticated By: Richarda Overlie, M.D.     Medications:  Prior to Admission:  Prescriptions prior to admission  Medication Sig Dispense Refill  . amLODipine (NORVASC) 10 MG tablet Take 10 mg by mouth daily.      Marland Kitchen aspirin EC 81 MG tablet Take 81 mg by mouth daily.      Marland Kitchen levothyroxine (SYNTHROID, LEVOTHROID) 50 MCG tablet Take 50 mcg by mouth daily before breakfast.      . metoprolol (LOPRESSOR) 50 MG tablet Take 50 mg by mouth daily.       Scheduled: . amLODipine  10 mg Oral Daily  .  ceFAZolin (ANCEF) IV  2 g Intravenous 30 min Pre-Op  . levothyroxine  50 mcg Oral QAC breakfast  . metoprolol  50 mg Oral Daily  . pantoprazole  40 mg Oral Q1200   Continuous:  ZOX:WRUEAVWU injection, ondansetron (ZOFRAN) IV  Assesment: She had a fractured left hip. She is set for surgery. She has hypertension which is doing better. She has hypothyroidism which is a relatively new diagnosis. She has been hypokalemic and that has been replaced Active Problems:   * No active hospital problems. *    Plan: She is okay for planned surgery now    LOS: 1 day   Regina [name] L 09/13/2012, 8:23 AM

## 2012-09-13 NOTE — Anesthesia Procedure Notes (Signed)
Spinal  Patient location during procedure: OR Start time: 09/13/2012 12:03 PM Staffing CRNA/Resident: Glynn Octave E Preanesthetic Checklist Completed: patient identified, site marked, surgical consent, pre-op evaluation, timeout performed, IV checked, risks and benefits discussed and monitors and equipment checked Spinal Block Patient position: left lateral decubitus Prep: Betadine Patient monitoring: heart rate, cardiac monitor, continuous pulse ox and blood pressure Approach: left paramedian Location: L3-4 Injection technique: single-shot Needle Needle type: Spinocan  Needle gauge: 22 G Needle length: 9 cm Assessment Sensory level: T8 Additional Notes  ATTEMPTS:1 TRAY UY:[id number] TRAY EXPIRATION DATE:05/2013

## 2012-09-14 DIAGNOSIS — I4891 Unspecified atrial fibrillation: Secondary | ICD-10-CM | POA: Diagnosis not present

## 2012-09-14 DIAGNOSIS — E876 Hypokalemia: Secondary | ICD-10-CM | POA: Diagnosis present

## 2012-09-14 LAB — CBC WITH DIFFERENTIAL/PLATELET
Basophils Absolute: 0 10*3/uL (ref 0.0–0.1)
Basophils Relative: 0 % (ref 0–1)
Eosinophils Absolute: 0.3 10*3/uL (ref 0.0–0.7)
Eosinophils Relative: 3 % (ref 0–5)
HCT: 36.4 % (ref 36.0–46.0)
Lymphocytes Relative: 12 % (ref 12–46)
MCHC: 33.5 g/dL (ref 30.0–36.0)
MCV: 90.3 fL (ref 78.0–100.0)
Monocytes Absolute: 0.8 10*3/uL (ref 0.1–1.0)
Platelets: 259 10*3/uL (ref 150–400)
RDW: 15.3 % (ref 11.5–15.5)
WBC: 8.9 10*3/uL (ref 4.0–10.5)

## 2012-09-14 LAB — BASIC METABOLIC PANEL
BUN: 13 mg/dL (ref 6–23)
CO2: 29 mEq/L (ref 19–32)
Calcium: 8.3 mg/dL — ABNORMAL LOW (ref 8.4–10.5)
Creatinine, Ser: 0.76 mg/dL (ref 0.50–1.10)

## 2012-09-14 MED ORDER — METHOCARBAMOL 500 MG PO TABS
500.0000 mg | ORAL_TABLET | Freq: Four times a day (QID) | ORAL | Status: DC | PRN
Start: 2012-09-14 — End: 2012-09-18
  Administered 2012-09-14 – 2012-09-15 (×2): 500 mg via ORAL
  Filled 2012-09-14 (×2): qty 1

## 2012-09-14 MED ORDER — POTASSIUM CHLORIDE CRYS ER 20 MEQ PO TBCR
40.0000 meq | EXTENDED_RELEASE_TABLET | Freq: Once | ORAL | Status: AC
  Administered 2012-09-14: 40 meq via ORAL
  Filled 2012-09-14: qty 2

## 2012-09-14 MED ORDER — METOPROLOL SUCCINATE ER 50 MG PO TB24
50.0000 mg | ORAL_TABLET | Freq: Every day | ORAL | Status: DC
  Administered 2012-09-14 – 2012-09-16 (×3): 50 mg via ORAL
  Filled 2012-09-14 (×3): qty 1

## 2012-09-14 NOTE — Anesthesia Postprocedure Evaluation (Signed)
  Anesthesia Post-op Note  Patient: Regina [name]  Procedure(s) Performed: Procedure(s): ASNIS HIP PINNING LEFT (Left)  Patient Location: Spinal  Level of Consciousness: awake, alert , oriented and patient cooperative  Airway and Oxygen Therapy: Patient Spontanous Breathing  Post-op Pain: 3 /10, moderate  Post-op Assessment: Post-op Vital signs reviewed, Patient's Cardiovascular Status Stable, Respiratory Function Stable, Patent Airway, No signs of Nausea or vomiting, Adequate PO intake and Pain level controlled  Post-op Vital Signs: Reviewed and stable  Complications: No apparent anesthesia complications

## 2012-09-14 NOTE — Progress Notes (Signed)
Dr.Deterding was notified about increased HR and low urine output. ECG was done. HR varies, but does not stay increased in 120s. No new orders received

## 2012-09-14 NOTE — Progress Notes (Signed)
eLink Physician-Brief Progress Note Patient Name: Regina [name] DOB: March 30, 1923 MRN: [id number]  Date of Service  09/14/2012   HPI/Events of Note   Hypokalemia  eICU Interventions  Potassium replaced   Intervention Category Minor Interventions: Electrolytes abnormality - evaluation and management  Mikaylah Libbey 09/14/2012, 6:35 AM

## 2012-09-14 NOTE — Progress Notes (Signed)
Subjective: She says she feels okay. She has had episodes of atrial fibrillation she had previously had PACs. Her potassium level was low and this may be the problem.  Objective: Vital signs in last 24 hours: Temp:  [97.5 F (36.4 C)-98.9 F (37.2 C)] 97.9 F (36.6 C) (04/12 0800) Pulse Rate:  [34-112] 112 (04/12 0500) Resp:  [13-25] 23 (04/12 0813) BP: (80-129)/(38-65) 97/53 mmHg (04/12 0700) SpO2:  [76 %-100 %] 97 % (04/12 0750) Weight:  [60.8 kg (134 lb 0.6 oz)] 60.8 kg (134 lb 0.6 oz) (04/12 0500) Weight change: -62.2 kg (-137 lb 2 oz) Last BM Date: 09/11/12  Intake/Output from previous day: 04/11 0701 - 04/12 0700 In: 1260 [P.O.:360; I.V.:700; IV Piggyback:200] Out: 710 [Urine:700; Drains:10]  PHYSICAL EXAM General appearance: alert, cooperative and no distress Resp: clear to auscultation bilaterally Cardio: regularly irregular rhythm GI: soft, non-tender; bowel sounds normal; no masses,  no organomegaly Extremities: Postop hip surgery  Lab Results:    Basic Metabolic Panel:  Recent Labs  96/04/54 0550 09/14/12 0503  NA 140 138  K 3.5 3.1*  CL 102 100  CO2 28 29  GLUCOSE 108* 113*  BUN 12 13  CREATININE 0.74 0.76  CALCIUM 8.2* 8.3*   Liver Function Tests:  Recent Labs  09/12/12 1545  AST 31  ALT 23  ALKPHOS 483*  BILITOT 1.1  PROT 7.0  ALBUMIN 2.9*   No results found for this basename: LIPASE, AMYLASE,  in the last 72 hours No results found for this basename: AMMONIA,  in the last 72 hours CBC:  Recent Labs  09/13/12 0550 09/14/12 0503  WBC 7.6 8.9  NEUTROABS 5.8 6.8  HGB 11.6* 12.2  HCT 35.2* 36.4  MCV 91.4 90.3  PLT 240 259   Cardiac Enzymes: No results found for this basename: CKTOTAL, CKMB, CKMBINDEX, TROPONINI,  in the last 72 hours BNP: No results found for this basename: PROBNP,  in the last 72 hours D-Dimer: No results found for this basename: DDIMER,  in the last 72 hours CBG: No results found for this basename: GLUCAP,   in the last 72 hours Hemoglobin A1C: No results found for this basename: HGBA1C,  in the last 72 hours Fasting Lipid Panel: No results found for this basename: CHOL, HDL, LDLCALC, TRIG, CHOLHDL, LDLDIRECT,  in the last 72 hours Thyroid Function Tests: No results found for this basename: TSH, T4TOTAL, FREET4, T3FREE, THYROIDAB,  in the last 72 hours Anemia Panel: No results found for this basename: VITAMINB12, FOLATE, FERRITIN, TIBC, IRON, RETICCTPCT,  in the last 72 hours Coagulation:  Recent Labs  09/12/12 1442  LABPROT 14.1  INR 1.10   Urine Drug Screen: Drugs of Abuse  No results found for this basename: labopia, cocainscrnur, labbenz, amphetmu, thcu, labbarb    Alcohol Level: No results found for this basename: ETH,  in the last 72 hours Urinalysis:  Recent Labs  09/12/12 1607  COLORURINE YELLOW  LABSPEC >1.030*  PHURINE 6.0  GLUCOSEU NEGATIVE  HGBUR NEGATIVE  BILIRUBINUR SMALL*  KETONESUR 15*  PROTEINUR 100*  UROBILINOGEN 2.0*  NITRITE NEGATIVE  LEUKOCYTESUR NEGATIVE   Misc. Labs:  ABGS No results found for this basename: PHART, PCO2, PO2ART, TCO2, HCO3,  in the last 72 hours CULTURES Recent Results (from the past 240 hour(s))  URINE CULTURE     Status: None   Collection Time    09/12/12  4:08 PM      Result Value Range Status   Specimen Description URINE, CLEAN CATCH  Final   Special Requests NONE   Final   Culture  Setup Time 09/13/2012 01:30   Final   Colony Count NO GROWTH   Final   Culture NO GROWTH   Final   Report Status 09/13/2012 FINAL   Final  SURGICAL PCR SCREEN     Status: None   Collection Time    09/12/12 10:29 PM      Result Value Range Status   MRSA, PCR NEGATIVE  NEGATIVE Final   Staphylococcus aureus NEGATIVE  NEGATIVE Final   Comment:            The Xpert SA Assay (FDA     approved for NASAL specimens     in patients over 58 years of age),     is one component of     a comprehensive surveillance     program.  Test  performance has     been validated by [employer] for patients greater     than or equal to 25 year old.     It is not intended     to diagnose infection nor to     guide or monitor treatment.   Studies/Results: Dg Chest 1 View  09/12/2012  *RADIOLOGY REPORT*  Clinical Data: History of fall.  CHEST - 1 VIEW  Comparison: 08/06/2012  Findings: Supine view of the chest was obtained.  There are retrocardiac densities suggestive for a large hiatal hernia.  Heart size is upper limits normal.  Coarse lung markings appear chronic. No evidence for a pneumothorax.  No focal chest disease. Mild deformity of the proximal left humerus is suggestive for an old fracture.  IMPRESSION: Chronic lung changes without acute findings.  Hiatal hernia.   Original Report Authenticated By: Richarda Overlie, M.D.    Dg Hip Complete Left  09/12/2012  *RADIOLOGY REPORT*  Clinical Data: Fall and hip pain.  LEFT HIP - COMPLETE 2+ VIEW  Comparison: None.  Findings: There is a mildly displaced fracture of the left femoral neck.  Fracture may be mildly comminuted.  The pelvic bony ring is intact.  The left hip is located.  IMPRESSION: Mildly displaced fracture of the left femoral neck.   Original Report Authenticated By: Richarda Overlie, M.D.    Dg Hip Operative Left  09/13/2012  *RADIOLOGY REPORT*  Clinical Data: ORIF comminuted left femoral neck fracture.  OPERATIVE LEFT HIP 2 VIEWS 09/13/2012:  Comparison: Left hip x-rays yesterday.  Findings: 3 spot images from the C-arm fluoroscopic device, AP and lateral views of the left hip, submitted for interpretation post- operatively demonstrate three compression screws traversing the basicervical left femoral neck fracture.  Alignment appears near anatomic.  The radiologic technologist documented 38 seconds of fluoroscopy time.  IMPRESSION: Near anatomic alignment post ORIF of the left femoral neck fracture.   Original Report Authenticated By: Hulan Saas, M.D.     Medications:  Prior to  Admission:  Prescriptions prior to admission  Medication Sig Dispense Refill  . amLODipine (NORVASC) 10 MG tablet Take 10 mg by mouth daily.      Marland Kitchen aspirin EC 81 MG tablet Take 81 mg by mouth daily.      Marland Kitchen levothyroxine (SYNTHROID, LEVOTHROID) 50 MCG tablet Take 50 mcg by mouth daily before breakfast.      . metoprolol (LOPRESSOR) 50 MG tablet Take 50 mg by mouth daily.       Scheduled: . acetaminophen  1,000 mg Intravenous Q6H  . albuterol  2.5 mg  Nebulization Q6H  . enoxaparin (LOVENOX) injection  40 mg Subcutaneous Q24H  . levothyroxine  50 mcg Oral QAC breakfast  . metoprolol succinate  50 mg Oral Daily  . pantoprazole  40 mg Oral Q1200  . potassium chloride  40 mEq Oral Once   Continuous:  ZOX:[id number], magnesium hydroxide, morphine injection, ondansetron (ZOFRAN) IV, promethazine  Assesment: She had fracture of the left femoral neck. She's had surgery for that. She has hypertension and that is pretty well controlled. She is a new problem which is atrial fibrillation. She is hypokalemic. She has relatively new hypothyroidism and that is being replaced Active Problems:   Fracture of femoral neck, left   Essential hypertension, benign   Unspecified hypothyroidism    Plan: I want to see if she'll go back into sinus rhythm with potassium replacement. I will change her from immediate release metoprolol to extended release which should help with her rhythm control. I am hesitant to anticoagulate her because of her recent surgery.    LOS: 2 days   Zarahi Fuerst L 09/14/2012, 9:08 AM

## 2012-09-14 NOTE — Evaluation (Signed)
Physical Therapy Evaluation Patient Details Name: Regina [name] MRN: [id number] DOB: [birth year] Today's Date: 09/14/2012 Time: 1450-1600    PT Assessment / Plan / Recommendation Clinical Impression  Patient was very agreeable to exercises and POT during therapy. Family members present at the time of eval and treatment. Pt had no pain at rest, pain noted during movement (5/10) and post-exercise/rest while in bed at 2/5. Pt was educated on WB status to LLE (TTWB to NWB at this time -unavailable in medical records with RN aware. Pt was able to tolerate transfer using a RW bed<>recliner and tolerated sitting for ~74mins. Pt may benefit SNF placement post-d/c to ensure safe return to PLOF. Pt has two-level house and with steps to front entrance and resided with spouse.     PT Assessment  Patient needs continued PT services    Follow Up Recommendations  SNF    Does the patient have the potential to tolerate intense rehabilitation      Barriers to Discharge Inaccessible home environment patient's room at second floor with 17steps and one rail.     Equipment Recommendations       Recommendations for Other Services     Frequency 7X/week    Precautions / Restrictions Precautions Precautions: Fall Restrictions Weight Bearing Restrictions: Yes LLE Weight Bearing: Touchdown weight bearing Other Position/Activity Restrictions: WB status pending at this time. Pt tolerates TTWB at this time to LLE          Mobility  Bed Mobility Bed Mobility: Supine to Sit;Sit to Supine Supine to Sit: 3: Mod assist Sit to Supine: 3: Mod assist Transfers Transfers: Sit to Stand;Stand to Sit;Stand Pivot Transfers Sit to Stand: From bed;From chair/3-in-1;4: Min assist Stand to Sit: 4: Min assist Stand Pivot Transfers: 3: Mod assist Ambulation/Gait General Gait Details: Pt made 2-3 steps using a RW (NWB to TTWB to LLE at this time).     Exercises Total Joint Exercises Ankle Circles/Pumps:  AROM;Both;20 reps;Seated;Supine Quad Sets: AROM;Both;10 reps;Supine Short Arc Quad: AROM;Supine;10 reps;Both Hip ABduction/ADduction: AROM;Both;10 reps;Supine Long Arc Quad: AROM;Both;20 reps;Seated   PT Diagnosis: Difficulty walking;Generalized weakness;Acute pain  PT Problem List: Decreased strength;Decreased range of motion;Decreased activity tolerance;Decreased balance;Decreased mobility;Pain PT Treatment Interventions: Gait training;Stair training;Functional mobility training;Therapeutic activities;Therapeutic exercise;Balance training;Patient/family education   PT Goals Acute Rehab PT Goals PT Goal Formulation: With patient Time For Goal Achievement: 09/28/12 Potential to Achieve Goals: Good Pt will Sit at [facility] of Bed: with supervision PT Goal: Sit at [facility] Of Bed - Progress: Goal set today Pt will go Sit to Supine/Side: with supervision PT Goal: Sit to Supine/Side - Progress: Goal set today Pt will go Sit to Stand: with supervision PT Goal: Sit to Stand - Progress: Goal set today Pt will go Stand to Sit: with supervision PT Goal: Stand to Sit - Progress: Goal set today Pt will Transfer Bed to Chair/Chair to Bed: with min assist PT Transfer Goal: Bed to Chair/Chair to Bed - Progress: Goal set today Pt will Ambulate: 1 - 15 feet PT Goal: Ambulate - Progress: Goal set today Pt will Go Up / Down Stairs: 1-2 stairs PT Goal: Up/Down Stairs - Progress: Goal set today  Visit Information  Last PT Received On: 09/14/12    Subjective Data  Subjective: To return home    Prior Functioning  Home Living Lives With: Spouse Type of Home: House Home Access: Stairs to enter [employer] of Steps: 3-4 Entrance Stairs-Rails: Right;Left Home Layout: Two level Alternate Level Stairs-Rails: Right Bathroom Toilet: Standard  Prior Function Level of Independence: Independent Able to Take Stairs?: Yes Driving: No Communication Communication: No difficulties    Cognition   Cognition Overall Cognitive Status: Appears within functional limits for tasks assessed/performed Arousal/Alertness: Awake/alert Behavior During Session: [facility] for tasks performed    Extremity/Trunk Assessment     Balance    End of Session PT - End of Session Equipment Utilized During Treatment: Gait belt;Oxygen Activity Tolerance: Patient tolerated treatment well Patient left: in bed;with call bell/phone within reach;with nursing in room;with family/visitor present Nurse Communication: Mobility status;Weight bearing status;Precautions  GP     Posey Jasmin, Larna Daughters 09/14/2012, 4:25 PM

## 2012-09-14 NOTE — Plan of Care (Signed)
Problem: Phase II Progression Outcomes Goal: Tolerating diet Outcome: Completed/Met Date Met:  09/14/12 Patient is on a regular diet and tolerating same Goal: Bed to chair Outcome: Progressing Patient was oob to chair today with PT assist.  Tonight the stedy was used as patient was unable to stand erect enough to walk safely.  Patient assisted to [facility] using stedy, flatus only.  MOM given Goal: Discharge plan established Outcome: Progressing Plan to home with spouse

## 2012-09-14 NOTE — Op Note (Signed)
[name], [name]              ACCOUNT NO.:  [id number]  MEDICAL RECORD NO.:  [id number]  LOCATION:  IC08                          FACILITY:  [facility]  PHYSICIAN:  J. Darreld Mclean, M.D. DATE OF BIRTH:  1923/02/02  DATE OF PROCEDURE: DATE OF DISCHARGE:                              OPERATIVE REPORT   PREOPERATIVE DIAGNOSIS:  Left femoral neck fracture.  POSTOPERATIVE DIAGNOSIS:  Left femoral neck fracture.  PROCEDURE:  In situ pinning with Asnis cannulated screws.  ANESTHESIA:  Spinal.  SURGEON:  J. Darreld Mclean, M.D.  ASSISTANTMarlinda Mike, RN.  One large Hemovac drain.  ESTIMATED BLOOD LOSS:  50 mL or less.  INDICATIONS:  The patient fell, she sustained the above-mentioned injury.  I informed her and her family the findings.  I explained the risks and imponderables.  I also explained about avascular necrosis, and the fracture could have problems with healing and may need to be changed for bipolar hip.  Appeared to understand, and agreed to the procedure as outlined.  DESCRIPTION OF PROCEDURE:  The patient was seen in the holding area and the left hip was identified as correct surgical site.  I placed a mark on the left hip.  The patient was brought to the OR, given spinal anesthesia.  She was transferred to the fracture table.  AP and lateral views of the hip were taken, these looked good, hip was reduced.  The patient was then prepped and draped in usual manner.  Prior to getting x- rays, everyone had on lead aprons, lead shields, and the x-ray markers. The patient prepped and draped in usual manner.  We had a generalized time-out identifying the patient as [name] and her left hip for femoral neck fracture.  All instrumentation properly working and in position.  OR team knew each other.  Incision made through skin, tensor fascia lata, vastus lateralis. Femoral shaft was identified, guide pin placed with good AP and lateral views.  Three parallel pins were placed.   They were measured and appropriate screws were then inserted.  Measured 80 to 85 mm.  X-rays were taken.  These looked good.  Hemovac drain was placed, sewn in with 2-0 silk, vastus lateralis reapproximated using running #1 Bralon. Tensor fascia lata reapproximated using figure- of-eight #1 Bralon.  Subcutaneous tissue closed in layers with 2-0 plain and skin reapproximated with skin staples.  Hemovac was sewn in with 2-0 silk.  Sterile dressing applied.  The patient tolerated the procedure well, went to recovery in good condition.          ______________________________ Shela Commons. Darreld Mclean, M.D.     JWK/MEDQ  D:  09/13/2012  T:  09/14/2012  Job:  [id number]

## 2012-09-14 NOTE — Progress Notes (Signed)
Subjective: 1 Day Post-Op Procedure(s) (LRB): ASNIS HIP PINNING LEFT (Left) Patient reports pain as 4 on 0-10 scale.    Objective: Vital signs in last 24 hours: Temp:  [97.5 F (36.4 C)-98.9 F (37.2 C)] 97.9 F (36.6 C) (04/12 0800) Pulse Rate:  [34-112] 112 (04/12 0500) Resp:  [13-25] 24 (04/12 0933) BP: (80-129)/(38-65) 97/53 mmHg (04/12 0700) SpO2:  [76 %-100 %] 97 % (04/12 0750) Weight:  [60.8 kg (134 lb 0.6 oz)] 60.8 kg (134 lb 0.6 oz) (04/12 0500)  Intake/Output from previous day: 04/11 0701 - 04/12 0700 In: 1260 [P.O.:360; I.V.:700; IV Piggyback:200] Out: 710 [Urine:700; Drains:10] Intake/Output this shift: Total I/O In: 100 [IV Piggyback:100] Out: -    Recent Labs  09/12/12 1442 09/13/12 0550 09/14/12 0503  HGB 12.7 11.6* 12.2    Recent Labs  09/13/12 0550 09/14/12 0503  WBC 7.6 8.9  RBC 3.85* 4.03  HCT 35.2* 36.4  PLT 240 259    Recent Labs  09/13/12 0550 09/14/12 0503  NA 140 138  K 3.5 3.1*  CL 102 100  CO2 28 29  BUN 12 13  CREATININE 0.74 0.76  GLUCOSE 108* 113*  CALCIUM 8.2* 8.3*    Recent Labs  09/12/12 1442  INR 1.10    Neurologically intact Neurovascular intact Sensation intact distally Intact pulses distally Dorsiflexion/Plantar flexion intact  She had episodes of atrial fib last night.  Her potassium is 3.1 today.  Dr. Juanetta Gosling has seen her.  Her BP has been low normal.  She will stay in ICU today for monitoring.  She is to begin PT today.  Her pain is well controlled.  Other labs are OK.  I have talked to her daughter today as well.  Assessment/Plan: 1 Day Post-Op Procedure(s) (LRB): ASNIS HIP PINNING LEFT (Left) Up with therapy  Eddis Pingleton 09/14/2012, 10:26 AM

## 2012-09-15 LAB — CBC WITH DIFFERENTIAL/PLATELET
Basophils Absolute: 0 10*3/uL (ref 0.0–0.1)
Eosinophils Relative: 4 % (ref 0–5)
Lymphocytes Relative: 11 % — ABNORMAL LOW (ref 12–46)
Lymphs Abs: 1 10*3/uL (ref 0.7–4.0)
MCV: 90.1 fL (ref 78.0–100.0)
Neutrophils Relative %: 76 % (ref 43–77)
Platelets: 293 10*3/uL (ref 150–400)
RBC: 4.53 MIL/uL (ref 3.87–5.11)
RDW: 15.2 % (ref 11.5–15.5)
WBC: 9.2 10*3/uL (ref 4.0–10.5)

## 2012-09-15 LAB — BASIC METABOLIC PANEL WITH GFR
BUN: 10 mg/dL (ref 6–23)
CO2: 29 meq/L (ref 19–32)
Calcium: 8.4 mg/dL (ref 8.4–10.5)
Chloride: 97 meq/L (ref 96–112)
Creatinine, Ser: 0.67 mg/dL (ref 0.50–1.10)
GFR calc Af Amer: 89 mL/min — ABNORMAL LOW
GFR calc non Af Amer: 77 mL/min — ABNORMAL LOW
Glucose, Bld: 110 mg/dL — ABNORMAL HIGH (ref 70–99)
Potassium: 4.3 meq/L (ref 3.5–5.1)
Sodium: 133 meq/L — ABNORMAL LOW (ref 135–145)

## 2012-09-15 MED ORDER — DEXTROSE 5 % IV SOLN
5.0000 mg/h | INTRAVENOUS | Status: DC
  Administered 2012-09-15: 5 mg/h via INTRAVENOUS
  Administered 2012-09-15: 10 mg/h via INTRAVENOUS
  Administered 2012-09-15 – 2012-09-16 (×2): 5 mg/h via INTRAVENOUS
  Filled 2012-09-15: qty 100

## 2012-09-15 MED ORDER — BIOTENE DRY MOUTH MT LIQD
15.0000 mL | Freq: Two times a day (BID) | OROMUCOSAL | Status: DC
  Administered 2012-09-15 – 2012-09-18 (×6): 15 mL via OROMUCOSAL

## 2012-09-15 NOTE — Progress Notes (Signed)
Subjective: She has had more trouble with atrial fibrillation with rapid ventricular response. She seems to be doing better in general. She has no other new complaints.  Objective: Vital signs in last 24 hours: Temp:  [97.9 F (36.6 C)-98.8 F (37.1 C)] 98.8 F (37.1 C) (04/13 0400) Pulse Rate:  [44-123] 123 (04/13 0500) Resp:  [13-24] 18 (04/13 0500) BP: (102-131)/(38-92) 106/77 mmHg (04/13 0500) SpO2:  [79 %-99 %] 98 % (04/13 0500) Weight:  [65 kg (143 lb 4.8 oz)] 65 kg (143 lb 4.8 oz) (04/13 0500) Weight change: 4.2 kg (9 lb 4.2 oz) Last BM Date: 09/11/12  Intake/Output from previous day: 04/12 0701 - 04/13 0700 In: 320 [P.O.:120; IV Piggyback:200] Out: 1000 [Urine:1000]  PHYSICAL EXAM General appearance: alert, cooperative and mild distress Resp: clear to auscultation bilaterally Cardio: irregularly irregular rhythm GI: soft, non-tender; bowel sounds normal; no masses,  no organomegaly Extremities: Post hip surgery  Lab Results:    Basic Metabolic Panel:  Recent Labs  78/29/56 0503 09/15/12 0515  NA 138 133*  K 3.1* 4.3  CL 100 97  CO2 29 29  GLUCOSE 113* 110*  BUN 13 10  CREATININE 0.76 0.67  CALCIUM 8.3* 8.4   Liver Function Tests:  Recent Labs  09/12/12 1545  AST 31  ALT 23  ALKPHOS 483*  BILITOT 1.1  PROT 7.0  ALBUMIN 2.9*   No results found for this basename: LIPASE, AMYLASE,  in the last 72 hours No results found for this basename: AMMONIA,  in the last 72 hours CBC:  Recent Labs  09/14/12 0503 09/15/12 0515  WBC 8.9 9.2  NEUTROABS 6.8 7.0  HGB 12.2 13.7  HCT 36.4 40.8  MCV 90.3 90.1  PLT 259 293   Cardiac Enzymes: No results found for this basename: CKTOTAL, CKMB, CKMBINDEX, TROPONINI,  in the last 72 hours BNP: No results found for this basename: PROBNP,  in the last 72 hours D-Dimer: No results found for this basename: DDIMER,  in the last 72 hours CBG: No results found for this basename: GLUCAP,  in the last 72  hours Hemoglobin A1C: No results found for this basename: HGBA1C,  in the last 72 hours Fasting Lipid Panel: No results found for this basename: CHOL, HDL, LDLCALC, TRIG, CHOLHDL, LDLDIRECT,  in the last 72 hours Thyroid Function Tests: No results found for this basename: TSH, T4TOTAL, FREET4, T3FREE, THYROIDAB,  in the last 72 hours Anemia Panel: No results found for this basename: VITAMINB12, FOLATE, FERRITIN, TIBC, IRON, RETICCTPCT,  in the last 72 hours Coagulation:  Recent Labs  09/12/12 1442  LABPROT 14.1  INR 1.10   Urine Drug Screen: Drugs of Abuse  No results found for this basename: labopia, cocainscrnur, labbenz, amphetmu, thcu, labbarb    Alcohol Level: No results found for this basename: ETH,  in the last 72 hours Urinalysis:  Recent Labs  09/12/12 1607  COLORURINE YELLOW  LABSPEC >1.030*  PHURINE 6.0  GLUCOSEU NEGATIVE  HGBUR NEGATIVE  BILIRUBINUR SMALL*  KETONESUR 15*  PROTEINUR 100*  UROBILINOGEN 2.0*  NITRITE NEGATIVE  LEUKOCYTESUR NEGATIVE   Misc. Labs:  ABGS No results found for this basename: PHART, PCO2, PO2ART, TCO2, HCO3,  in the last 72 hours CULTURES Recent Results (from the past 240 hour(s))  URINE CULTURE     Status: None   Collection Time    09/12/12  4:08 PM      Result Value Range Status   Specimen Description URINE, CLEAN CATCH   Final  Special Requests NONE   Final   Culture  Setup Time 09/13/2012 01:30   Final   Colony Count NO GROWTH   Final   Culture NO GROWTH   Final   Report Status 09/13/2012 FINAL   Final  SURGICAL PCR SCREEN     Status: None   Collection Time    09/12/12 10:29 PM      Result Value Range Status   MRSA, PCR NEGATIVE  NEGATIVE Final   Staphylococcus aureus NEGATIVE  NEGATIVE Final   Comment:            The Xpert SA Assay (FDA     approved for NASAL specimens     in patients over 22 years of age),     is one component of     a comprehensive surveillance     program.  Test performance has     been  validated by [employer] for patients greater     than or equal to 35 year old.     It is not intended     to diagnose infection nor to     guide or monitor treatment.   Studies/Results: Dg Hip Operative Left  09/13/2012  *RADIOLOGY REPORT*  Clinical Data: ORIF comminuted left femoral neck fracture.  OPERATIVE LEFT HIP 2 VIEWS 09/13/2012:  Comparison: Left hip x-rays yesterday.  Findings: 3 spot images from the C-arm fluoroscopic device, AP and lateral views of the left hip, submitted for interpretation post- operatively demonstrate three compression screws traversing the basicervical left femoral neck fracture.  Alignment appears near anatomic.  The radiologic technologist documented 38 seconds of fluoroscopy time.  IMPRESSION: Near anatomic alignment post ORIF of the left femoral neck fracture.   Original Report Authenticated By: Hulan Saas, M.D.     Medications:  Prior to Admission:  Prescriptions prior to admission  Medication Sig Dispense Refill  . amLODipine (NORVASC) 10 MG tablet Take 10 mg by mouth daily.      Marland Kitchen aspirin EC 81 MG tablet Take 81 mg by mouth daily.      Marland Kitchen levothyroxine (SYNTHROID, LEVOTHROID) 50 MCG tablet Take 50 mcg by mouth daily before breakfast.      . metoprolol (LOPRESSOR) 50 MG tablet Take 50 mg by mouth daily.       Scheduled: . albuterol  2.5 mg Nebulization Q6H  . enoxaparin (LOVENOX) injection  40 mg Subcutaneous Q24H  . levothyroxine  50 mcg Oral QAC breakfast  . metoprolol succinate  50 mg Oral Daily  . pantoprazole  40 mg Oral Q1200   Continuous:  ZOX:[id number], magnesium hydroxide, methocarbamol, morphine injection, ondansetron (ZOFRAN) IV, promethazine  Assesment: Because she continues to have rapid atrial fibrillation despite taking metoprolol I'm going to add diltiazem. She will get this IV for now with plans to switch her to by mouth once her heart rate is controlled. She will need cardiology consultations and she has remained in  atrial fibrillation Active Problems:   Fracture of femoral neck, left   Essential hypertension, benign   Unspecified hypothyroidism   Atrial fibrillation   Hypokalemia    Plan: As above    LOS: 3 days   Donnie Gedeon L 09/15/2012, 7:27 AM

## 2012-09-15 NOTE — Progress Notes (Signed)
Subjective: 2 Days Post-Op Procedure(s) (LRB): ASNIS HIP PINNING LEFT (Left) Patient reports pain as 3 on 0-10 scale.    Objective: Vital signs in last 24 hours: Temp:  [98.1 F (36.7 C)-98.8 F (37.1 C)] 98.4 F (36.9 C) (04/13 0727) Pulse Rate:  [44-123] 123 (04/13 0500) Resp:  [13-23] 16 (04/13 0700) BP: (102-131)/(38-92) 117/72 mmHg (04/13 0700) SpO2:  [79 %-99 %] 96 % (04/13 0744) Weight:  [65 kg (143 lb 4.8 oz)] 65 kg (143 lb 4.8 oz) (04/13 0500)  Intake/Output from previous day: 04/12 0701 - 04/13 0700 In: 320 [P.O.:120; IV Piggyback:200] Out: 1000 [Urine:1000] Intake/Output this shift: Total I/O In: 15.8 [I.V.:15.8] Out: -    Recent Labs  09/12/12 1442 09/13/12 0550 09/14/12 0503 09/15/12 0515  HGB 12.7 11.6* 12.2 13.7    Recent Labs  09/14/12 0503 09/15/12 0515  WBC 8.9 9.2  RBC 4.03 4.53  HCT 36.4 40.8  PLT 259 293    Recent Labs  09/14/12 0503 09/15/12 0515  NA 138 133*  K 3.1* 4.3  CL 100 97  CO2 29 29  BUN 13 10  CREATININE 0.76 0.67  GLUCOSE 113* 110*  CALCIUM 8.3* 8.4    Recent Labs  09/12/12 1442  INR 1.10    Neurologically intact Neurovascular intact Sensation intact distally Intact pulses distally Dorsiflexion/Plantar flexion intact Incision: scant drainage No cellulitis present  Hemovac and foley removed.  Wound OK.  She has less pain.  She is moving better.  Heart rhythm still a problem.  To stay in ICU.  Assessment/Plan: 2 Days Post-Op Procedure(s) (LRB): ASNIS HIP PINNING LEFT (Left) Up with therapy  Regina [name] 09/15/2012, 12:19 PM

## 2012-09-16 ENCOUNTER — Encounter (HOSPITAL_COMMUNITY): Payer: Self-pay | Admitting: Adult Health

## 2012-09-16 DIAGNOSIS — I4891 Unspecified atrial fibrillation: Secondary | ICD-10-CM

## 2012-09-16 DIAGNOSIS — I10 Essential (primary) hypertension: Secondary | ICD-10-CM

## 2012-09-16 DIAGNOSIS — I369 Nonrheumatic tricuspid valve disorder, unspecified: Secondary | ICD-10-CM

## 2012-09-16 LAB — BASIC METABOLIC PANEL
CO2: 31 mEq/L (ref 19–32)
Calcium: 8.4 mg/dL (ref 8.4–10.5)
GFR calc non Af Amer: 76 mL/min — ABNORMAL LOW (ref >90)
Glucose, Bld: 110 mg/dL — ABNORMAL HIGH (ref 70–99)
Potassium: 4.5 mEq/L (ref 3.5–5.1)
Sodium: 136 mEq/L (ref 135–145)

## 2012-09-16 LAB — CBC WITH DIFFERENTIAL/PLATELET
Hemoglobin: 12.6 g/dL (ref 12.0–15.0)
Lymphocytes Relative: 15 % (ref 12–46)
Lymphs Abs: 1.3 10*3/uL (ref 0.7–4.0)
MCH: 29.6 pg (ref 26.0–34.0)
MCV: 90.6 fL (ref 78.0–100.0)
Monocytes Relative: 12 % (ref 3–12)
Neutrophils Relative %: 69 % (ref 43–77)
Platelets: 334 10*3/uL (ref 150–400)
RBC: 4.26 MIL/uL (ref 3.87–5.11)
WBC: 8.3 10*3/uL (ref 4.0–10.5)

## 2012-09-16 LAB — TYPE AND SCREEN: Unit division: 0

## 2012-09-16 MED ORDER — DILTIAZEM HCL 30 MG PO TABS
30.0000 mg | ORAL_TABLET | Freq: Four times a day (QID) | ORAL | Status: DC
  Administered 2012-09-16 – 2012-09-17 (×4): 30 mg via ORAL
  Filled 2012-09-16 (×4): qty 1

## 2012-09-16 MED ORDER — APIXABAN 5 MG PO TABS
ORAL_TABLET | ORAL | Status: AC
  Filled 2012-09-16: qty 1

## 2012-09-16 MED ORDER — FLECAINIDE ACETATE 100 MG PO TABS
ORAL_TABLET | ORAL | Status: AC
  Filled 2012-09-16: qty 3

## 2012-09-16 MED ORDER — APIXABAN 5 MG PO TABS
5.0000 mg | ORAL_TABLET | Freq: Two times a day (BID) | ORAL | Status: DC
  Administered 2012-09-17 – 2012-09-18 (×4): 5 mg via ORAL
  Filled 2012-09-16 (×7): qty 1

## 2012-09-16 MED ORDER — METOPROLOL TARTRATE 25 MG PO TABS
25.0000 mg | ORAL_TABLET | Freq: Four times a day (QID) | ORAL | Status: DC
  Administered 2012-09-16: 25 mg via ORAL
  Filled 2012-09-16: qty 1

## 2012-09-16 MED ORDER — FLECAINIDE ACETATE 100 MG PO TABS
300.0000 mg | ORAL_TABLET | Freq: Once | ORAL | Status: AC
  Administered 2012-09-16: 300 mg via ORAL
  Filled 2012-09-16: qty 3

## 2012-09-16 NOTE — Progress Notes (Signed)
*  PRELIMINARY RESULTS* Echocardiogram 2D Echocardiogram has been performed.  Regina [name] 09/16/2012, 10:06 AM

## 2012-09-16 NOTE — Progress Notes (Signed)
Subjective: She looks better. She is awake and alert. She has converted to sinus rhythm with PACs and PVCs on IV diltiazem. She has no other new complaints  Objective: Vital signs in last 24 hours: Temp:  [98 F (36.7 C)-99.2 F (37.3 C)] 98 F (36.7 C) (04/14 0700) Pulse Rate:  [26-126] 36 (04/14 0700) Resp:  [9-30] 17 (04/14 0700) BP: (80-130)/(33-99) 111/56 mmHg (04/14 0700) SpO2:  [89 %-100 %] 97 % (04/14 0747) Weight:  [65.1 kg (143 lb 8.3 oz)] 65.1 kg (143 lb 8.3 oz) (04/14 0600) Weight change: 0.1 kg (3.5 oz) Last BM Date: 09/15/12  Intake/Output from previous day: 04/13 0701 - 04/14 0700 In: 401.8 [P.O.:240; I.V.:161.8] Out: -   PHYSICAL EXAM General appearance: alert, cooperative and no distress Resp: clear to auscultation bilaterally Cardio: Her heart is mostly regular with extrasystoles she does not have a murmur or gallop GI: soft, non-tender; bowel sounds normal; no masses,  no organomegaly Extremities: Postop hip surgery  Lab Results:    Basic Metabolic Panel:  Recent Labs  45/40/98 0515 09/16/12 0444  NA 133* 136  K 4.3 4.5  CL 97 101  CO2 29 31  GLUCOSE 110* 110*  BUN 10 10  CREATININE 0.67 0.69  CALCIUM 8.4 8.4   Liver Function Tests: No results found for this basename: AST, ALT, ALKPHOS, BILITOT, PROT, ALBUMIN,  in the last 72 hours No results found for this basename: LIPASE, AMYLASE,  in the last 72 hours No results found for this basename: AMMONIA,  in the last 72 hours CBC:  Recent Labs  09/15/12 0515 09/16/12 0444  WBC 9.2 8.3  NEUTROABS 7.0 5.7  HGB 13.7 12.6  HCT 40.8 38.6  MCV 90.1 90.6  PLT 293 334   Cardiac Enzymes: No results found for this basename: CKTOTAL, CKMB, CKMBINDEX, TROPONINI,  in the last 72 hours BNP: No results found for this basename: PROBNP,  in the last 72 hours D-Dimer: No results found for this basename: DDIMER,  in the last 72 hours CBG: No results found for this basename: GLUCAP,  in the last 72  hours Hemoglobin A1C: No results found for this basename: HGBA1C,  in the last 72 hours Fasting Lipid Panel: No results found for this basename: CHOL, HDL, LDLCALC, TRIG, CHOLHDL, LDLDIRECT,  in the last 72 hours Thyroid Function Tests: No results found for this basename: TSH, T4TOTAL, FREET4, T3FREE, THYROIDAB,  in the last 72 hours Anemia Panel: No results found for this basename: VITAMINB12, FOLATE, FERRITIN, TIBC, IRON, RETICCTPCT,  in the last 72 hours Coagulation: No results found for this basename: LABPROT, INR,  in the last 72 hours Urine Drug Screen: Drugs of Abuse  No results found for this basename: labopia, cocainscrnur, labbenz, amphetmu, thcu, labbarb    Alcohol Level: No results found for this basename: ETH,  in the last 72 hours Urinalysis: No results found for this basename: COLORURINE, APPERANCEUR, LABSPEC, PHURINE, GLUCOSEU, HGBUR, BILIRUBINUR, KETONESUR, PROTEINUR, UROBILINOGEN, NITRITE, LEUKOCYTESUR,  in the last 72 hours Misc. Labs:  ABGS No results found for this basename: PHART, PCO2, PO2ART, TCO2, HCO3,  in the last 72 hours CULTURES Recent Results (from the past 240 hour(s))  URINE CULTURE     Status: None   Collection Time    09/12/12  4:08 PM      Result Value Range Status   Specimen Description URINE, CLEAN CATCH   Final   Special Requests NONE   Final   Culture  Setup Time 09/13/2012 01:30  Final   Colony Count NO GROWTH   Final   Culture NO GROWTH   Final   Report Status 09/13/2012 FINAL   Final  SURGICAL PCR SCREEN     Status: None   Collection Time    09/12/12 10:29 PM      Result Value Range Status   MRSA, PCR NEGATIVE  NEGATIVE Final   Staphylococcus aureus NEGATIVE  NEGATIVE Final   Comment:            The Xpert SA Assay (FDA     approved for NASAL specimens     in patients over 34 years of age),     is one component of     a comprehensive surveillance     program.  Test performance has     been validated by [employer] for  patients greater     than or equal to 54 year old.     It is not intended     to diagnose infection nor to     guide or monitor treatment.   Studies/Results: No results found.  Medications:  Prior to Admission:  Prescriptions prior to admission  Medication Sig Dispense Refill  . amLODipine (NORVASC) 10 MG tablet Take 10 mg by mouth daily.      Marland Kitchen aspirin EC 81 MG tablet Take 81 mg by mouth daily.      Marland Kitchen levothyroxine (SYNTHROID, LEVOTHROID) 50 MCG tablet Take 50 mcg by mouth daily before breakfast.      . metoprolol (LOPRESSOR) 50 MG tablet Take 50 mg by mouth daily.       Scheduled: . albuterol  2.5 mg Nebulization Q6H  . antiseptic oral rinse  15 mL Mouth Rinse BID  . diltiazem  30 mg Oral Q6H  . enoxaparin (LOVENOX) injection  40 mg Subcutaneous Q24H  . levothyroxine  50 mcg Oral QAC breakfast  . metoprolol succinate  50 mg Oral Daily  . pantoprazole  40 mg Oral Q1200   Continuous:  GEX:[id number], magnesium hydroxide, methocarbamol, morphine injection, ondansetron (ZOFRAN) IV, promethazine  Assesment: She had atrial fibrillation and now has converted to sinus rhythm. She had a hip fracture and has had surgery on that. She has hypertension she had been on amlodipine but I will switch her to diltiazem and continue metoprolol she will need close monitoring of her heart rate to make sure she doesn't get too low. Inpatient cardiology consult has been requested and echocardiogram has been requested. I'm going to discontinue intravenous Cardizem and switch her to by mouth and will likely move her from the intensive care unit later today Active Problems:   Fracture of femoral neck, left   Essential hypertension, benign   Unspecified hypothyroidism   Atrial fibrillation   Hypokalemia    Plan: As above    LOS: 4 days   Helen Winterhalter L 09/16/2012, 7:49 AM

## 2012-09-16 NOTE — Consult Note (Signed)
CARDIOLOGY CONSULT NOTE  Patient ID: Regina [name] MRN: [id number] DOB/AGE: Feb 19, 1923 79 y.o.  Admit date: 09/12/2012 Referring Physician: Kari Baars Primary Herb Grays, MD Primary Cardiologist: New-Ysabelle Goodroe Reason for Consultation: New onset post operative atrial fibrillation.  Active Problems:   Fracture of femoral neck, left   Essential hypertension, benign   Unspecified hypothyroidism   Atrial fibrillation   Hypokalemia  HPI: Regina [name] is a 79 y/o patient with no prior cardiac history, admitted with acute left hip fracture, s/p ORIF on 09/14/2012 with post operative atrial fib with RVR. The patient tripped over her slipper and fell while walking to the bathroom.  She was placed on Cardizem drip, and converted to normal sinus rhythm. She continues to have frequent PACs and PVCs. She was found to be hypokalemic on admission with a potassium of 2.7 which is sensitive repleted.     The patient has a history of an ischemic CVA, non-embolic, in 2003 in the setting of uncontrolled hypertension. She also has a history of iron deficiency anemia, and had not been found to be a candidate for anticoagulation at that time. She has no history of GI bleeding, however she did have an endoscopy and a colonoscopy by Dr. Kendell Bane in 2008 in the setting of anemia, which was unremarkable. Other history includes hypothyroidism.   Of note, the patient states that over the holidays, December to January, she was treated for an infection of unknown etiology, and since that time has not felt back to her usual energy level. She felt weak, become more frail, and has lost weight. She denies syncope, presyncope, palpitations, or chest discomfort now or in the last 6 months.  Review of systems complete and found to be negative unless listed above   Past Medical History  Diagnosis Date  . Stroke   . Hypertension   . Hypothyroidism     Family History: Father deceased with heart disease, mother  deceased with heart disease, mother deceased with stomach cancer. History   Social History  . Marital Status: Married    Spouse Name: N/A    Number of Children: N/A  . Years of Education: N/A   Occupational History  . Not on file.   Social History Main Topics  . Smoking status: Never Smoker   . Smokeless tobacco: Never Used  . Alcohol Use: No     Comment: None since Christmas 2013  . Drug Use: No  . Sexually Active: Not on file   Other Topics Concern  . Not on file   Social History Narrative  . No narrative on file    Past Surgical History  Procedure Laterality Date  . Eye surgery    . Mouth sugery      Upper teeth impacted     Prescriptions prior to admission  Medication Sig Dispense Refill  . amLODipine (NORVASC) 10 MG tablet Take 10 mg by mouth daily.      Marland Kitchen aspirin EC 81 MG tablet Take 81 mg by mouth daily.      Marland Kitchen levothyroxine (SYNTHROID, LEVOTHROID) 50 MCG tablet Take 50 mcg by mouth daily before breakfast.      . metoprolol (LOPRESSOR) 50 MG tablet Take 50 mg by mouth daily.        Physical Exam: Blood pressure 111/56, pulse 36, temperature 98 F (36.7 C), temperature source Oral, resp. rate 17, height 5\' 5"  (1.651 m), weight 143 lb 8.3 oz (65.1 kg), SpO2 97.00%.  General: Well developed, well nourished, in no acute  distress, frail. Head: Eyes PERRLA, No xanthomas.   Normal cephalic and atramatic  Lungs: Clear bilaterally to auscultation and percussion. Heart: HRIR S1 S2, 1/6 systolic murmur. Pulses are 2+ & equal.            No carotid bruit. No JVD.  No abdominal bruits. No femoral bruits. Abdomen: Bowel sounds are positive, abdomen soft and non-tender without masses or                  Hernia's noted. Msk:  Back normal, normal gait. Normal strength and tone for age. Extremities: No clubbing, cyanosis or edema. Left hip post-operative dressing. DP 1+. Neuro: Alert and oriented X 3. Psych:  Good affect, responds appropriately  Labs:   Lab Results   Component Value Date   WBC 8.3 09/16/2012   HGB 12.6 09/16/2012   HCT 38.6 09/16/2012   MCV 90.6 09/16/2012   PLT 334 09/16/2012       Radiology: Chest X-Ray 09/12/2012 IMPRESSION: Chronic lung changes without acute findings. Hiatal hernia.  EKG: NSR with 1st degree AV block and PAC's.  Low-voltage; delayed R-wave progression; nonspecific ST-T wave abnormality.  Telemetry: Sinus rhythm with PACs earlier today; converted to atrial flutter with 2:1 block at approximately 1 PM, and has continued in that rhythm with variable AV block.    Echocardiogram: Mild to moderate LVH, mild aortic stenosis, normal EF, moderate tricuspid regurgitation  ASSESSMENT AND PLAN:   1. New Onset Atrial fibrillation: The patient continues irregular heart rhythm, normal sinus rhythm, frequent PACs, and PVCs. Multifactorial,  in the setting of postoperative catecholamine surge , and hypokalemia.   Heart rate is adequately controlled with oral diltiazem 30 mg 4 times a day. She was not found to be a candidate for anticoagulation in 2003 status post CVA due to a controlled hypertension and anemia. Currently those are not issues. CHADS score of 4. She is currently on low molecular weight heparin only. Recommend short-term anticoagulation therapy. Consider Xarelto when safe to do so  post operatively. More recommendations throughout hospital course, and patient's response to treatment.  2. CVA: 2008 in the setting of uncontrolled hypertension with  no residual hemophoresis or weakness.   3. S/P Left ORIF: Per Dr. Hilda Lias.  4. Hypertension: Currently well controlled on diltiazem.  5. Hypothyroidism: No recent TSH per review of labs. Will order to evaluate if this is contributing to atrial fibrillation.  Bettey Mare. [name] NP [facility] 09/16/2012, 10:33 AM  Cardiology Attending Patient interviewed and examined. Discussed with Joni Reining, NP.  Above note annotated and modified based upon my findings.   Patient EKG shows sinus rhythm with frequent PACs with aberrancy and first degree AV block. Current rhythm is atrial flutter with 2-1 AV block. Presence of prolonged PR interval indicates a component of sick sinus syndrome with the risk of bradycardia induced by AV node blocking agents. Patient is asymptomatic with her tachycardia, and we'll proceed gradually with treatment in attempt to avoid adverse reactions to medication. Echocardiogram shows no significant structural heart disease. TSH is pending. Anticoagulation with a novel oral anticoagulant, either apixaban or rivaroxaban, can be initiated as soon as safe to do so from a postoperative standpoint. Lovenox will be discontinued once oral anticoagulant is initiated.  Cardioversion will be undertaken in 3 weeks if arrhythmia persists.  Since atrial arrhythmia has only been present consistently for the past 6 hours, a single dose of flecainide will be given in an attempt to convert her back to  sinus rhythm.  Thayer Bing, MD 09/16/2012, 6:13 PM

## 2012-09-16 NOTE — Progress Notes (Signed)
Subjective: 3 Days Post-Op Procedure(s) (LRB): ASNIS HIP PINNING LEFT (Left) Patient reports pain as 2 on 0-10 scale.    Objective: Vital signs in last 24 hours: Temp:  [98 F (36.7 C)-99.2 F (37.3 C)] 98 F (36.7 C) (04/14 0400) Pulse Rate:  [26-126] 47 (04/14 0645) Resp:  [9-30] 18 (04/14 0645) BP: (80-130)/(33-99) 123/51 mmHg (04/14 0645) SpO2:  [89 %-100 %] 96 % (04/14 0645) Weight:  [65.1 kg (143 lb 8.3 oz)] 65.1 kg (143 lb 8.3 oz) (04/14 0600)  Intake/Output from previous day: 04/13 0701 - 04/14 0700 In: 396.8 [P.O.:240; I.V.:156.8] Out: -  Intake/Output this shift:     Recent Labs  09/14/12 0503 09/15/12 0515 09/16/12 0444  HGB 12.2 13.7 12.6    Recent Labs  09/15/12 0515 09/16/12 0444  WBC 9.2 8.3  RBC 4.53 4.26  HCT 40.8 38.6  PLT 293 334    Recent Labs  09/15/12 0515 09/16/12 0444  NA 133* 136  K 4.3 4.5  CL 97 101  CO2 29 31  BUN 10 10  CREATININE 0.67 0.69  GLUCOSE 110* 110*  CALCIUM 8.4 8.4   No results found for this basename: LABPT, INR,  in the last 72 hours  Neurologically intact Neurovascular intact Sensation intact distally Intact pulses distally Dorsiflexion/Plantar flexion intact Incision: no drainage No cellulitis present  To be seen by cardiologist today.  Possible discharge to SNF tomorrow.  Assessment/Plan: 3 Days Post-Op Procedure(s) (LRB): ASNIS HIP PINNING LEFT (Left) Up with therapy  Page Lancon 09/16/2012, 7:19 AM

## 2012-09-16 NOTE — Clinical Social Work Psychosocial (Signed)
Clinical Social Work Department BRIEF PSYCHOSOCIAL ASSESSMENT 09/16/2012  Patient:  [name],[name]     Account Number:  [id number]     Admit date:  09/12/2012  Clinical Social Worker:  Nancie Neas  Date/Time:  09/16/2012 01:35 PM  Referred by:  Physician  Date Referred:  09/16/2012 Referred for  SNF Placement   Other Referral:   Interview type:  Patient Other interview type:   and husband    PSYCHOSOCIAL DATA Living Status:  FAMILY Admitted from facility:   Level of care:   Primary support name:  Regina [name] Primary support relationship to patient:  SPOUSE Degree of support available:   supportive    CURRENT CONCERNS Current Concerns  Post-Acute Placement   Other Concerns:    SOCIAL WORK ASSESSMENT / PLAN CSW met with pt and pt's husband at bedside in ICU. Pt is very pleasant, alert and oriented and reports she lives with her husband. Her son and daughter-in-law live nearby and her daughter lives in [place]. Pt admitted at the end of last week with a hip fracture. Pt reports she slid out of her slipper and fell in her bedroom. At baseline, pt is independent in care and ambulates independently as well. Pt states she had a stroke 10 years ago and quit driving at that time. She said she has fully recovered from her stroke and completed outpatient rehab. CSW discussed PT recommendation of SNF. She is aware of Medicare coverage/criteria. Pt agreeable to placement, requesting  if possible. SNF list provided.   Assessment/plan status:  Psychosocial Support/Ongoing Assessment of Needs Other assessment/ plan:   Information/referral to community resources:   SNF list    PATIENT'S/FAMILY'S RESPONSE TO PLAN OF CARE: Pt and pt's husband agree that although pt is making progress with PT, it would be best to go to SNF short term before returning home. CSW will fax out FL2 and follow up with bed offers when available.        Regina [name], [place] [phone]

## 2012-09-16 NOTE — Progress Notes (Signed)
DR Juanetta Gosling AND DR Dietrich Pates IN TO EXAMINE PT. STAT 12 LEAD EKG PREFORMED FOR POSS ATRIAL FLUTTER, WHICH WAS POSITIVE FOR ATRIAL FLUTTER.Marland Kitchen

## 2012-09-16 NOTE — Clinical Social Work Placement (Signed)
    Clinical Social Work Department CLINICAL SOCIAL WORK PLACEMENT NOTE 09/16/2012  Patient:  [name],[name]  Account Number:  [id number] Admit date:  09/12/2012  Clinical Social Worker:  Santa Genera, CLINICAL SOCIAL WORKER  Date/time:  09/16/2012 12:00 N  Clinical Social Work is seeking post-discharge placement for this patient at the following level of care:   SKILLED NURSING   (*CSW will update this form in Epic as items are completed)   09/16/2012  Patient/family provided with [facility] Department of Clinical Social Work's list of facilities offering this level of care within the geographic area requested by the patient (or if unable, by the patient's family).  09/16/2012  Patient/family informed of their freedom to choose among providers that offer the needed level of care, that participate in Medicare, Medicaid or managed care program needed by the patient, have an available bed and are willing to accept the patient.  09/16/2012  Patient/family informed of [facility]' ownership interest in [facility], as well as of the fact that they are under no obligation to receive care at this facility.  PASARR submitted to EDS on 09/16/2012 PASARR number received from EDS on 09/16/2012  FL2 transmitted to all facilities in geographic area requested by pt/family on  09/16/2012 FL2 transmitted to all facilities within larger geographic area on   Patient informed that his/her managed care company has contracts with or will negotiate with  certain facilities, including the following:     Patient/family informed of bed offers received:   Patient chooses bed at  Physician recommends and patient chooses bed at    Patient to be transferred to  on   Patient to be transferred to facility by   The following physician request were entered in Epic:   Additional Comments:  Santa Genera, LCSW Clinical Social Worker [phone])

## 2012-09-16 NOTE — Progress Notes (Signed)
Physical Therapy Treatment Patient Details Name: Regina [name] MRN: [id number] DOB: 01/07/1923 Today's Date: 09/16/2012 Time: 4098-1191 PT Time Calculation (min): 46 min  PT Assessment / Plan / Recommendation Comments on Treatment Session  Pt is doing quite well with PT.  She has minimal reported pain, tolerated ther ex with no problem, needed only min assist to transfer OOB to walker.  She was instructed in gait with a walker, TTWB L and ambulated 10' with min assist.  She is up to a chair and comfortable.    Follow Up Recommendations        Does the patient have the potential to tolerate intense rehabilitation     Barriers to Discharge        Equipment Recommendations       Recommendations for Other Services    Frequency     Plan Discharge plan remains appropriate;Frequency remains appropriate    Precautions / Restrictions     Pertinent Vitals/Pain     Mobility  Bed Mobility Supine to Sit: 4: Min assist;HOB elevated Sit to Supine: Not Tested (comment) Transfers Sit to Stand: 4: Min assist;With upper extremity assist Stand to Sit: 4: Min assist;With upper extremity assist Ambulation/Gait Ambulation/Gait Assistance: 4: Min assist Ambulation Distance (Feet): 10 Feet Assistive device: Rolling walker Gait Pattern: Step-through pattern;Decreased stance time - left;Narrow base of support Gait velocity: slow and labored Stairs: No Wheelchair Mobility Wheelchair Mobility: No    Exercises General Exercises - Lower Extremity Ankle Circles/Pumps: AROM;Both;Supine;10 reps Quad Sets: AROM;Both;10 reps;Supine Gluteal Sets: AROM;Both;10 reps;Supine Short Arc Quad: AROM;Both;10 reps;Supine Heel Slides: AROM;AAROM;Both;10 reps;Supine Hip ABduction/ADduction: AROM;AAROM;Both;10 reps;Supine   PT Diagnosis:    PT Problem List:   PT Treatment Interventions:     PT Goals Acute Rehab PT Goals PT Goal: Sit at Edge Of Bed - Progress: Progressing toward goal PT Goal: Sit to  Supine/Side - Progress: Progressing toward goal PT Goal: Sit to Stand - Progress: Progressing toward goal PT Goal: Stand to Sit - Progress: Progressing toward goal PT Goal: Ambulate - Progress: Progressing toward goal  Visit Information  Last PT Received On: 09/16/12    Subjective Data  Subjective: feels good   Cognition       Balance     End of Session PT - End of Session Equipment Utilized During Treatment: Gait belt Activity Tolerance: Patient tolerated treatment well Patient left: in chair;with call bell/phone within reach Nurse Communication: Mobility status   GP     Regina [name] L 09/16/2012, 1:13 PM

## 2012-09-16 NOTE — Progress Notes (Signed)
UR Chart Review Completed  

## 2012-09-17 DIAGNOSIS — I10 Essential (primary) hypertension: Secondary | ICD-10-CM

## 2012-09-17 DIAGNOSIS — I4891 Unspecified atrial fibrillation: Secondary | ICD-10-CM

## 2012-09-17 LAB — TSH: TSH: 18.557 u[IU]/mL — ABNORMAL HIGH (ref 0.350–4.500)

## 2012-09-17 MED ORDER — DILTIAZEM HCL ER COATED BEADS 180 MG PO CP24
180.0000 mg | ORAL_CAPSULE | Freq: Every day | ORAL | Status: DC
  Administered 2012-09-17 – 2012-09-18 (×2): 180 mg via ORAL
  Filled 2012-09-17 (×2): qty 1

## 2012-09-17 MED ORDER — METOPROLOL TARTRATE 25 MG PO TABS
25.0000 mg | ORAL_TABLET | Freq: Four times a day (QID) | ORAL | Status: DC
  Administered 2012-09-17 (×3): 25 mg via ORAL
  Filled 2012-09-17 (×5): qty 1

## 2012-09-17 NOTE — Progress Notes (Signed)
Subjective: 4 Days Post-Op Procedure(s) (LRB): ASNIS HIP PINNING LEFT (Left) Patient reports pain as 2 on 0-10 scale.    Objective: Vital signs in last 24 hours: Temp:  [98.4 F (36.9 C)-98.7 F (37.1 C)] 98.4 F (36.9 C) (04/15 0400) Pulse Rate:  [25-82] 78 (04/15 0700) Resp:  [14-27] 18 (04/15 0600) BP: (90-139)/(48-93) 118/67 mmHg (04/15 0700) SpO2:  [78 %-100 %] 87 % (04/15 0600)  Intake/Output from previous day: 04/14 0701 - 04/15 0700 In: 1205 [P.O.:1200; I.V.:5] Out: 1600 [Urine:1600] Intake/Output this shift:     Recent Labs  09/15/12 0515 09/16/12 0444  HGB 13.7 12.6    Recent Labs  09/15/12 0515 09/16/12 0444  WBC 9.2 8.3  RBC 4.53 4.26  HCT 40.8 38.6  PLT 293 334    Recent Labs  09/15/12 0515 09/16/12 0444  NA 133* 136  K 4.3 4.5  CL 97 101  CO2 29 31  BUN 10 10  CREATININE 0.67 0.69  GLUCOSE 110* 110*  CALCIUM 8.4 8.4   No results found for this basename: LABPT, INR,  in the last 72 hours  Neurologically intact Neurovascular intact Sensation intact distally Intact pulses distally Dorsiflexion/Plantar flexion intact Incision: no drainage No cellulitis present  I have read the cardiology notes.  Her rhythm is normal right now when making rounds.  She could be started on the rivaoxaban as suggested by cardiology whenever it is desired to start it.  It should not be a problem at this point with her hip.    Assessment/Plan: 4 Days Post-Op Procedure(s) (LRB): ASNIS HIP PINNING LEFT (Left) Up with therapy  When she is able to be discharged to SNF, she will need to have continuation of the anti-coagulation for about one month.  She will need PT, toe touch on the left with walker.  She will need to have the staples removed April 21 with Steri-strip applied.  I will need to see her in the office in one month with x-rays prior to the appointment.  Nakia Remmers 09/17/2012, 7:05 AM

## 2012-09-17 NOTE — Progress Notes (Signed)
Consulting cardiologist: Dr.  Bing  SUBJECTIVE:Feeling very well. No complaints.  Ready to "get moving."  LABS: Basic Metabolic Panel:  Recent Labs  78/29/56 0515 09/16/12 0444 09/16/12 1836  NA 133* 136  --   K 4.3 4.5  --   CL 97 101  --   CO2 29 31  --   GLUCOSE 110* 110*  --   BUN 10 10  --   CREATININE 0.67 0.69  --   CALCIUM 8.4 8.4  --   MG  --   --  1.8   CBC:  Recent Labs  09/15/12 0515 09/16/12 0444  WBC 9.2 8.3  NEUTROABS 7.0 5.7  HGB 13.7 12.6  HCT 40.8 38.6  MCV 90.1 90.6  PLT 293 334   Echocardiogram 09/16/2012 Left ventricle: The cavity size was normal. There was mild to moderate concentric hypertrophy. Systolic function was normal. The estimated ejection fraction was 60%. Wall motion was normal; there were no regional wall motion abnormalities. - Aortic valve: Cusp separation was mildly reduced. There was very mild stenosis. Valve area: 0.55cm^2(VTI). Valve area: 0.57cm^2 (Vmax). - Mitral valve: Moderately calcified annulus. Mild regurgitation. - Left atrium: The atrium was mildly to moderately dilated. - Right atrium: The atrium was mildly dilated. - Atrial septum: No defect or patent foramen ovale was identified. - Tricuspid valve: Moderate regurgitation.   PHYSICAL EXAM BP 118/67  Pulse 78  Temp(Src) 98.1 F (36.7 C) (Oral)  Resp 18  Ht 5\' 5"  (1.651 m)  Wt 143 lb 8.3 oz (65.1 kg)  BMI 23.88 kg/m2  SpO2 87% General: Well developed, well nourished, in no acute distress  Lungs: Clear bilaterally to auscultation and percussion. Heart: HRIR S1 S2,1/6 systolic murmur.   Extremities: No edema. Dressing to left hip. DP +1 Neuro: Alert and oriented X 3.  TELEMETRY: Periods of sinus rhythm noted, atrial ectopy, bursts of PAF.  ASSESSMENT AND PLAN:  1. Postoperative atrial fibrillation: She has been transitioned to long-acting Cardizem 180 mg daily. She was given one dose of flecainide 300 mg per Dr. Dietrich Pates yesterday  evening. She has now been started on Apixaban 5 mg BID by Dr. Juanetta Gosling after approval by Dr. Hilda Lias postoperatively with CHADS2 score of 4. DCCV may need to be considered if atrial fibrillation persists, although she does look to have episodes of sinus rhythm based on telemetry review. Needs followup ECG. Transferring to telemetry.  2. History of CVA: 2008 in the setting of uncontrolled hypertension with no residual hemophoresis or weakness.   3. S/P Left ORIF: Per Dr. Hilda Lias.  Plans to move to skilled nursing facility for physical therapy post discharge.  4. Hypertension: Currently well controlled on diltiazem, which has been transitioned to long-acting..   5. Hypothyroidism: No recent TSH per review of labs. Awaiting results of TSH.  Bettey Mare. [name] NP [facility] 09/17/2012, 8:41 AM   Attending note:  Patient seen and examined. Above note by Ms. Lawrence NP modified. Reviewed consultation note from Dr. Dietrich Pates yesterday. Patient with postoperative atrial fibrillation, rate controlled with calcium channel blocker. She is now on Apixaban and was given a dose of flecainide yesterday. Does seem to have some episodes of sinus rhythm by limited telemetry review, however no followup ECG as yet. She will be transitioning to telemetry today, and we will continue to follow to determine whether a DCCV will ultimately be needed if atrial fibrillation persists. She is otherwise hemodynamically stable and in good spirits. Discussed with daughter at bedside.  Satira Sark, M.D., F.A.C.C.

## 2012-09-17 NOTE — Clinical Social Work Placement (Signed)
Clinical Social Work Department CLINICAL SOCIAL WORK PLACEMENT NOTE 09/17/2012  Patient:  Regina [name],Regina [name]  Account Number:  [id number] Admit date:  09/12/2012  Clinical Social Worker:  Santa Genera, CLINICAL SOCIAL WORKER  Date/time:  09/16/2012 12:00 N  Clinical Social Work is seeking post-discharge placement for this patient at the following level of care:   SKILLED NURSING   (*CSW will update this form in Epic as items are completed)   09/16/2012  Patient/family provided with [facility] Department of Clinical Social Work's list of facilities offering this level of care within the geographic area requested by the patient (or if unable, by the patient's family).  09/16/2012  Patient/family informed of their freedom to choose among providers that offer the needed level of care, that participate in Medicare, Medicaid or managed care program needed by the patient, have an available bed and are willing to accept the patient.  09/16/2012  Patient/family informed of [facility]' ownership interest in [facility], as well as of the fact that they are under no obligation to receive care at this facility.  PASARR submitted to EDS on 09/16/2012 PASARR number received from EDS on 09/16/2012  FL2 transmitted to all facilities in geographic area requested by pt/family on  09/16/2012 FL2 transmitted to all facilities within larger geographic area on   Patient informed that his/her managed care company has contracts with or will negotiate with  certain facilities, including the following:     Patient/family informed of bed offers received:  09/17/2012 Patient chooses bed at [facility] Physician recommends and patient chooses bed at  [facility]  Patient to be transferred to  on   Patient to be transferred to facility by   The following physician request were entered in Epic:   Additional Comments:  Derenda Fennel, LCSW [phone]

## 2012-09-17 NOTE — Progress Notes (Signed)
Subjective: She says she feels well. She has no new complaints. Dr. Hilda Lias says it's okay to go ahead and anticoagulate her now.  Objective: Vital signs in last 24 hours: Temp:  [98.1 F (36.7 C)-98.7 F (37.1 C)] 98.1 F (36.7 C) (04/15 0800) Pulse Rate:  [25-82] 78 (04/15 0700) Resp:  [14-27] 18 (04/15 0600) BP: (90-121)/(48-93) 118/67 mmHg (04/15 0700) SpO2:  [78 %-97 %] 87 % (04/15 0600) Weight change:  Last BM Date: 09/16/12  Intake/Output from previous day: 04/14 0701 - 04/15 0700 In: 1205 [P.O.:1200; I.V.:5] Out: 1600 [Urine:1600]  PHYSICAL EXAM General appearance: alert, cooperative and no distress Resp: clear to auscultation bilaterally Cardio: irregularly irregular rhythm GI: soft, non-tender; bowel sounds normal; no masses,  no organomegaly Extremities: extremities normal, atraumatic, no cyanosis or edema  Lab Results:    Basic Metabolic Panel:  Recent Labs  16/10/96 0515 09/16/12 0444 09/16/12 1836  NA 133* 136  --   K 4.3 4.5  --   CL 97 101  --   CO2 29 31  --   GLUCOSE 110* 110*  --   BUN 10 10  --   CREATININE 0.67 0.69  --   CALCIUM 8.4 8.4  --   MG  --   --  1.8   Liver Function Tests: No results found for this basename: AST, ALT, ALKPHOS, BILITOT, PROT, ALBUMIN,  in the last 72 hours No results found for this basename: LIPASE, AMYLASE,  in the last 72 hours No results found for this basename: AMMONIA,  in the last 72 hours CBC:  Recent Labs  09/15/12 0515 09/16/12 0444  WBC 9.2 8.3  NEUTROABS 7.0 5.7  HGB 13.7 12.6  HCT 40.8 38.6  MCV 90.1 90.6  PLT 293 334   Cardiac Enzymes: No results found for this basename: CKTOTAL, CKMB, CKMBINDEX, TROPONINI,  in the last 72 hours BNP: No results found for this basename: PROBNP,  in the last 72 hours D-Dimer: No results found for this basename: DDIMER,  in the last 72 hours CBG: No results found for this basename: GLUCAP,  in the last 72 hours Hemoglobin A1C: No results found for this  basename: HGBA1C,  in the last 72 hours Fasting Lipid Panel: No results found for this basename: CHOL, HDL, LDLCALC, TRIG, CHOLHDL, LDLDIRECT,  in the last 72 hours Thyroid Function Tests: No results found for this basename: TSH, T4TOTAL, FREET4, T3FREE, THYROIDAB,  in the last 72 hours Anemia Panel: No results found for this basename: VITAMINB12, FOLATE, FERRITIN, TIBC, IRON, RETICCTPCT,  in the last 72 hours Coagulation: No results found for this basename: LABPROT, INR,  in the last 72 hours Urine Drug Screen: Drugs of Abuse  No results found for this basename: labopia, cocainscrnur, labbenz, amphetmu, thcu, labbarb    Alcohol Level: No results found for this basename: ETH,  in the last 72 hours Urinalysis: No results found for this basename: COLORURINE, APPERANCEUR, LABSPEC, PHURINE, GLUCOSEU, HGBUR, BILIRUBINUR, KETONESUR, PROTEINUR, UROBILINOGEN, NITRITE, LEUKOCYTESUR,  in the last 72 hours Misc. Labs:  ABGS No results found for this basename: PHART, PCO2, PO2ART, TCO2, HCO3,  in the last 72 hours CULTURES Recent Results (from the past 240 hour(s))  URINE CULTURE     Status: None   Collection Time    09/12/12  4:08 PM      Result Value Range Status   Specimen Description URINE, CLEAN CATCH   Final   Special Requests NONE   Final   Culture  Setup  Time 09/13/2012 01:30   Final   Colony Count NO GROWTH   Final   Culture NO GROWTH   Final   Report Status 09/13/2012 FINAL   Final  SURGICAL PCR SCREEN     Status: None   Collection Time    09/12/12 10:29 PM      Result Value Range Status   MRSA, PCR NEGATIVE  NEGATIVE Final   Staphylococcus aureus NEGATIVE  NEGATIVE Final   Comment:            The Xpert SA Assay (FDA     approved for NASAL specimens     in patients over 65 years of age),     is one component of     a comprehensive surveillance     program.  Test performance has     been validated by [employer] for patients greater     than or equal to 57 year old.      It is not intended     to diagnose infection nor to     guide or monitor treatment.   Studies/Results: No results found.  Medications:  Prior to Admission:  Prescriptions prior to admission  Medication Sig Dispense Refill  . amLODipine (NORVASC) 10 MG tablet Take 10 mg by mouth daily.      Marland Kitchen aspirin EC 81 MG tablet Take 81 mg by mouth daily.      Marland Kitchen levothyroxine (SYNTHROID, LEVOTHROID) 50 MCG tablet Take 50 mcg by mouth daily before breakfast.      . metoprolol (LOPRESSOR) 50 MG tablet Take 50 mg by mouth daily.       Scheduled: . antiseptic oral rinse  15 mL Mouth Rinse BID  . apixaban  5 mg Oral BID  . diltiazem  30 mg Oral Q6H  . levothyroxine  50 mcg Oral QAC breakfast  . metoprolol tartrate  25 mg Oral QID  . pantoprazole  40 mg Oral Q1200   Continuous:  ZOX:[id number], magnesium hydroxide, methocarbamol, morphine injection, ondansetron (ZOFRAN) IV, promethazine  Assesment: She has been in atrial flutter and atrial fibrillation. She had a hip fracture. She needs to be anticoagulated and it is okay per Dr. Hilda Lias. Active Problems:   Fracture of femoral neck, left   Essential hypertension, benign   Unspecified hypothyroidism   Atrial fibrillation   Hypokalemia    Plan: Continue current medications. Transfer her from the ICU. I will change her to long-acting diltiazem. I will start anti-coagulation.    LOS: 5 days   Dniyah Grant L 09/17/2012, 8:06 AM

## 2012-09-17 NOTE — Clinical Social Work Note (Signed)
CSW presented bed offers and pt and pt's family choose [facility]. Facility notified. Anticipated d/c for tomorrow per MD. CSW to continue to follow and assist with d/c as needed.  Derenda Fennel, [place] [phone]

## 2012-09-17 NOTE — Progress Notes (Signed)
Physical Therapy Treatment Patient Details Name: Regina [name] MRN: [id number] DOB: 14-Mar-1923 Today's Date: 09/17/2012 Time: 4098-1191 PT Time Calculation (min): 41 min  PT Assessment / Plan / Recommendation Comments on Treatment Session  _Pt is progressing beautifully with increased LLE strength and increased fluidity and distance of gait.  She reports no -pain, is able to maintain TTWB L.  We are using an ice pack over L hip for edema control    Follow Up Recommendations        Does the patient have the potential to tolerate intense rehabilitation     Barriers to Discharge        Equipment Recommendations       Recommendations for Other Services    Frequency     Plan Discharge plan remains appropriate;Frequency remains appropriate    Precautions / Restrictions     Pertinent Vitals/Pain     Mobility  Bed Mobility Supine to Sit: 4: Min assist;HOB elevated Transfers Sit to Stand: 4: Min guard;With upper extremity assist Stand to Sit: 4: Min guard;With upper extremity assist Details for Transfer Assistance: pt instrructed in sliding LLE forward prior to sitting in order to decrease discomfort Ambulation/Gait Ambulation/Gait Assistance: 4: Min guard Ambulation Distance (Feet): 15 Feet Assistive device: Rolling walker Gait Pattern: Within Functional Limits General Gait Details: cues for increasing thoracic extension during gait Stairs: No    Exercises General Exercises - Lower Extremity Ankle Circles/Pumps: AROM;Both;10 reps;Supine Quad Sets: AROM;Both;10 reps;Supine Gluteal Sets: AROM;Both;10 reps;Supine Short Arc Quad: AROM;Both;10 reps;Supine Heel Slides: AROM;AAROM;Both;10 reps;Supine Hip ABduction/ADduction: AROM;AAROM;Both;10 reps;Supine   PT Diagnosis:    PT Problem List:   PT Treatment Interventions:     PT Goals Acute Rehab PT Goals PT Goal: Sit at Edge Of Bed - Progress: Progressing toward goal PT Goal: Sit to Stand - Progress: Met PT Goal: Stand  to Sit - Progress: Met PT Goal: Ambulate - Progress: Met PT Goal: Up/Down Stairs - Progress: Discontinued (comment)  Visit Information  Last PT Received On: 09/17/12    Subjective Data  Subjective: no c/o   Cognition       Balance     End of Session PT - End of Session Equipment Utilized During Treatment: Gait belt Activity Tolerance: Patient tolerated treatment well Patient left: in chair;with call bell/phone within reach;with family/visitor present Nurse Communication: Mobility status   GP     Myrlene Broker L 09/17/2012, 11:16 AM

## 2012-09-18 ENCOUNTER — Inpatient Hospital Stay
Admission: RE | Admit: 2012-09-18 | Discharge: 2012-12-18 | Disposition: A | Discharge status: Home / Self Care | Payer: Self-pay | Source: Ambulatory Visit | Attending: Pulmonary Disease | Admitting: Pulmonary Disease

## 2012-09-18 DIAGNOSIS — S72002D Fracture of unspecified part of neck of left femur, subsequent encounter for closed fracture with routine healing: Principal | ICD-10-CM

## 2012-09-18 DIAGNOSIS — M6281 Muscle weakness (generalized): Secondary | ICD-10-CM | POA: Diagnosis not present

## 2012-09-18 DIAGNOSIS — I69920 Aphasia following unspecified cerebrovascular disease: Secondary | ICD-10-CM | POA: Diagnosis not present

## 2012-09-18 DIAGNOSIS — R262 Difficulty in walking, not elsewhere classified: Secondary | ICD-10-CM | POA: Diagnosis not present

## 2012-09-18 DIAGNOSIS — I4892 Unspecified atrial flutter: Secondary | ICD-10-CM

## 2012-09-18 DIAGNOSIS — I69919 Unspecified symptoms and signs involving cognitive functions following unspecified cerebrovascular disease: Secondary | ICD-10-CM | POA: Diagnosis not present

## 2012-09-18 DIAGNOSIS — R634 Abnormal weight loss: Secondary | ICD-10-CM | POA: Diagnosis not present

## 2012-09-18 DIAGNOSIS — IMO0002 Reserved for concepts with insufficient information to code with codable children: Secondary | ICD-10-CM | POA: Diagnosis not present

## 2012-09-18 DIAGNOSIS — D649 Anemia, unspecified: Secondary | ICD-10-CM | POA: Diagnosis not present

## 2012-09-18 DIAGNOSIS — T148XXA Other injury of unspecified body region, initial encounter: Secondary | ICD-10-CM

## 2012-09-18 DIAGNOSIS — E039 Hypothyroidism, unspecified: Secondary | ICD-10-CM | POA: Diagnosis not present

## 2012-09-18 DIAGNOSIS — Z9181 History of falling: Secondary | ICD-10-CM | POA: Diagnosis not present

## 2012-09-18 DIAGNOSIS — I4891 Unspecified atrial fibrillation: Secondary | ICD-10-CM | POA: Diagnosis not present

## 2012-09-18 DIAGNOSIS — I10 Essential (primary) hypertension: Secondary | ICD-10-CM | POA: Diagnosis not present

## 2012-09-18 DIAGNOSIS — E876 Hypokalemia: Secondary | ICD-10-CM | POA: Diagnosis not present

## 2012-09-18 DIAGNOSIS — S72009A Fracture of unspecified part of neck of unspecified femur, initial encounter for closed fracture: Secondary | ICD-10-CM | POA: Diagnosis not present

## 2012-09-18 DIAGNOSIS — S72033A Displaced midcervical fracture of unspecified femur, initial encounter for closed fracture: Secondary | ICD-10-CM | POA: Diagnosis not present

## 2012-09-18 DIAGNOSIS — I484 Atypical atrial flutter: Secondary | ICD-10-CM

## 2012-09-18 DIAGNOSIS — S72009D Fracture of unspecified part of neck of unspecified femur, subsequent encounter for closed fracture with routine healing: Secondary | ICD-10-CM | POA: Diagnosis not present

## 2012-09-18 DIAGNOSIS — R279 Unspecified lack of coordination: Secondary | ICD-10-CM | POA: Diagnosis not present

## 2012-09-18 MED ORDER — METOPROLOL TARTRATE 50 MG PO TABS: 50.0000 mg | ORAL_TABLET | Freq: Two times a day (BID) | ORAL | Status: DC

## 2012-09-18 NOTE — Progress Notes (Signed)
Subjective: 5 Days Post-Op Procedure(s) (LRB): ASNIS HIP PINNING LEFT (Left) Patient reports pain as 2 on 0-10 scale.    Objective: Vital signs in last 24 hours: Temp:  [97.5 F (36.4 C)-98.1 F (36.7 C)] 97.5 F (36.4 C) (04/16 0545) Pulse Rate:  [64-87] 69 (04/16 0545) Resp:  [18-23] 18 (04/16 0545) BP: (93-132)/(37-83) 132/81 mmHg (04/16 0545) SpO2:  [86 %-99 %] 99 % (04/16 0545)  Intake/Output from previous day: 04/15 0701 - 04/16 0700 In: -  Out: 450 [Urine:450] Intake/Output this shift:     Recent Labs  09/16/12 0444  HGB 12.6    Recent Labs  09/16/12 0444  WBC 8.3  RBC 4.26  HCT 38.6  PLT 334    Recent Labs  09/16/12 0444  NA 136  K 4.5  CL 101  CO2 31  BUN 10  CREATININE 0.69  GLUCOSE 110*  CALCIUM 8.4   No results found for this basename: LABPT, INR,  in the last 72 hours  Neurologically intact Neurovascular intact Sensation intact distally Intact pulses distally Dorsiflexion/Plantar flexion intact Incision: no drainage No cellulitis present  She is doing well  Assessment/Plan: 5 Days Post-Op Procedure(s) (LRB): ASNIS HIP PINNING LEFT (Left) Up with therapy Discharge to SNF when medically appropriate. Regina [name] 09/18/2012, 7:52 AM

## 2012-09-18 NOTE — Progress Notes (Signed)
   Consulting cardiologist: Dr. Hartford Bing  SUBJECTIVE: Feels very well. Has been up walking in the room.  LABS: Basic Metabolic Panel:  Recent Labs  91/47/82 0444 09/16/12 1836  NA 136  --   K 4.5  --   CL 101  --   CO2 31  --   GLUCOSE 110*  --   BUN 10  --   CREATININE 0.69  --   CALCIUM 8.4  --   MG  --  1.8   CBC:  Recent Labs  09/16/12 0444  WBC 8.3  NEUTROABS 5.7  HGB 12.6  HCT 38.6  MCV 90.6  PLT 334   Cardiac Enzymes:  Thyroid Function Tests:  Recent Labs  09/16/12 1836  TSH 18.557*    PHYSICAL EXAM BP 132/81  Pulse 69  Temp(Src) 97.5 F (36.4 C) (Oral)  Resp 18  Ht 5\' 5"  (1.651 m)  Wt 143 lb 8.3 oz (65.1 kg)  BMI 23.88 kg/m2  SpO2 99% General: No acute distress  Lungs: Clear bilaterally to auscultation and percussion. Heart: HRRR S1 S2, No MRG Extremities: No edema.  DP +1 Neuro: Alert and oriented X 3.  TELEMETRY: Reviewed telemetry pt in: NSR with PAC's.   ASSESSMENT AND PLAN:  1. Postoperative atrial fibrillation: Did not convert to sinus rhythm after conversion dose of flecainide two days ago. She continues on apixaban, metoprolol 25 mg QID,and diltiazem CD 180 mg daily with good heart rate control and actually looks to be in atypical atrial flutter with 2:1 block at this point. Blood pressure is controlled. EKG is ordered this morning for confirmation. Will change to BID 50 mg metoprolol for ease of administration prior to discharge to SNF for PT post L ORIF. Follow up appt is made with cardiology in 2 weeks in [place] office.  2. Hypertension: Well controlled currently.  3. S/P ORIF: For follow up SNF and PT, continued management per Dr. Hilda Lias.  Regina [name]. Regina Bishop NP [facility] 09/18/2012, 8:13 AM   Attending note:  Above note by Ms. Lawrence NP modified by me. Patient being discharged to SNF today for further rehabilitation following left ORIF. She looks to be in atypical atrial flutter with 2:1 block,  although heart rate is well controlled on current regimen including beta blocker and calcium channel blocker, and she is anticoagulated with apixaban. She will need to have cardiology followup to discuss elective DCCV if this persists. Office followup is being arranged.   Jonelle Sidle, M.D., F.A.C.C.

## 2012-09-18 NOTE — Progress Notes (Signed)
Pt discharged to [facility] today per Dr. Juanetta Gosling. Pt's IV site D/C'd and WNL. Pt's VS stable at this time. Report called to [facility], report given to [facility].  Pt to report to room 134 per [facility] nurse. Pt left floor via WC in stable condition accompanied by NT.

## 2012-09-18 NOTE — Progress Notes (Signed)
Subjective: She feels well and has no complaints. She is okay to go to a skilled care facility today.  Objective: Vital signs in last 24 hours: Temp:  [97.5 F (36.4 C)-97.7 F (36.5 C)] 97.5 F (36.4 C) (04/16 0545) Pulse Rate:  [64-87] 69 (04/16 0545) Resp:  [18-23] 18 (04/16 0545) BP: (93-132)/(37-83) 132/81 mmHg (04/16 0545) SpO2:  [86 %-99 %] 99 % (04/16 0545) Weight change:  Last BM Date: 09/16/12  Intake/Output from previous day: 04/15 0701 - 04/16 0700 In: -  Out: 450 [Urine:450]  PHYSICAL EXAM General appearance: alert, cooperative and no distress Resp: clear to auscultation bilaterally Cardio: irregularly irregular rhythm GI: soft, non-tender; bowel sounds normal; no masses,  no organomegaly Extremities: Postop left hip surgery  Lab Results:    Basic Metabolic Panel:  Recent Labs  40/98/11 0444 09/16/12 1836  NA 136  --   K 4.5  --   CL 101  --   CO2 31  --   GLUCOSE 110*  --   BUN 10  --   CREATININE 0.69  --   CALCIUM 8.4  --   MG  --  1.8   Liver Function Tests: No results found for this basename: AST, ALT, ALKPHOS, BILITOT, PROT, ALBUMIN,  in the last 72 hours No results found for this basename: LIPASE, AMYLASE,  in the last 72 hours No results found for this basename: AMMONIA,  in the last 72 hours CBC:  Recent Labs  09/16/12 0444  WBC 8.3  NEUTROABS 5.7  HGB 12.6  HCT 38.6  MCV 90.6  PLT 334   Cardiac Enzymes: No results found for this basename: CKTOTAL, CKMB, CKMBINDEX, TROPONINI,  in the last 72 hours BNP: No results found for this basename: PROBNP,  in the last 72 hours D-Dimer: No results found for this basename: DDIMER,  in the last 72 hours CBG: No results found for this basename: GLUCAP,  in the last 72 hours Hemoglobin A1C: No results found for this basename: HGBA1C,  in the last 72 hours Fasting Lipid Panel: No results found for this basename: CHOL, HDL, LDLCALC, TRIG, CHOLHDL, LDLDIRECT,  in the last 72  hours Thyroid Function Tests:  Recent Labs  09/16/12 1836  TSH 18.557*   Anemia Panel: No results found for this basename: VITAMINB12, FOLATE, FERRITIN, TIBC, IRON, RETICCTPCT,  in the last 72 hours Coagulation: No results found for this basename: LABPROT, INR,  in the last 72 hours Urine Drug Screen: Drugs of Abuse  No results found for this basename: labopia, cocainscrnur, labbenz, amphetmu, thcu, labbarb    Alcohol Level: No results found for this basename: ETH,  in the last 72 hours Urinalysis: No results found for this basename: COLORURINE, APPERANCEUR, LABSPEC, PHURINE, GLUCOSEU, HGBUR, BILIRUBINUR, KETONESUR, PROTEINUR, UROBILINOGEN, NITRITE, LEUKOCYTESUR,  in the last 72 hours Misc. Labs:  ABGS No results found for this basename: PHART, PCO2, PO2ART, TCO2, HCO3,  in the last 72 hours CULTURES Recent Results (from the past 240 hour(s))  URINE CULTURE     Status: None   Collection Time    09/12/12  4:08 PM      Result Value Range Status   Specimen Description URINE, CLEAN CATCH   Final   Special Requests NONE   Final   Culture  Setup Time 09/13/2012 01:30   Final   Colony Count NO GROWTH   Final   Culture NO GROWTH   Final   Report Status 09/13/2012 FINAL   Final  SURGICAL PCR  SCREEN     Status: None   Collection Time    09/12/12 10:29 PM      Result Value Range Status   MRSA, PCR NEGATIVE  NEGATIVE Final   Staphylococcus aureus NEGATIVE  NEGATIVE Final   Comment:            The Xpert SA Assay (FDA     approved for NASAL specimens     in patients over 53 years of age),     is one component of     a comprehensive surveillance     program.  Test performance has     been validated by [employer] for patients greater     than or equal to 48 year old.     It is not intended     to diagnose infection nor to     guide or monitor treatment.   Studies/Results: No results found.  Medications:  Prior to Admission:  Prescriptions prior to admission   Medication Sig Dispense Refill  . amLODipine (NORVASC) 10 MG tablet Take 10 mg by mouth daily.      Marland Kitchen aspirin EC 81 MG tablet Take 81 mg by mouth daily.      Marland Kitchen levothyroxine (SYNTHROID, LEVOTHROID) 50 MCG tablet Take 50 mcg by mouth daily before breakfast.      . metoprolol (LOPRESSOR) 50 MG tablet Take 50 mg by mouth daily.       Scheduled: . antiseptic oral rinse  15 mL Mouth Rinse BID  . apixaban  5 mg Oral BID  . diltiazem  180 mg Oral Daily  . levothyroxine  50 mcg Oral QAC breakfast  . metoprolol tartrate  25 mg Oral QID  . pantoprazole  40 mg Oral Q1200   Continuous:  WUJ:[id number], magnesium hydroxide, methocarbamol, morphine injection, ondansetron (ZOFRAN) IV, promethazine  Assesment: She was admitted with fracture of the femoral neck on the left. She has hypertension and recently diagnosed hypothyroidism. She was hypokalemic and went into atrial flutter fibrillation during her hospitalization. Her heart rate is better controlled. She is ready for transfer to skilled care facility Active Problems:   Fracture of femoral neck, left   Essential hypertension, benign   Unspecified hypothyroidism   Atrial fibrillation   Hypokalemia    Plan: Transfer to skilled care facility    LOS: 6 days   Ad Guttman L 09/18/2012, 8:29 AM

## 2012-09-18 NOTE — Clinical Social Work Placement (Signed)
Clinical Social Work Department CLINICAL SOCIAL WORK PLACEMENT NOTE 09/18/2012  Patient:  Regina [name],Regina [name]  Account Number:  [id number] Admit date:  09/12/2012  Clinical Social Worker:  Santa Genera, CLINICAL SOCIAL WORKER  Date/time:  09/16/2012 12:00 N  Clinical Social Work is seeking post-discharge placement for this patient at the following level of care:   SKILLED NURSING   (*CSW will update this form in Epic as items are completed)   09/16/2012  Patient/family provided with [facility] Department of Clinical Social Work's list of facilities offering this level of care within the geographic area requested by the patient (or if unable, by the patient's family).  09/16/2012  Patient/family informed of their freedom to choose among providers that offer the needed level of care, that participate in Medicare, Medicaid or managed care program needed by the patient, have an available bed and are willing to accept the patient.  09/16/2012  Patient/family informed of [facility]' ownership interest in [name], as well as of the fact that they are under no obligation to receive care at this facility.  PASARR submitted to EDS on 09/16/2012 PASARR number received from EDS on 09/16/2012  FL2 transmitted to all facilities in geographic area requested by pt/family on  09/16/2012 FL2 transmitted to all facilities within larger geographic area on   Patient informed that his/her managed care company has contracts with or will negotiate with  certain facilities, including the following:     Patient/family informed of bed offers received:  09/17/2012 Patient chooses bed at [facility] Physician recommends and patient chooses bed at  [facility]  Patient to be transferred to [facility] on  09/18/2012 Patient to be transferred to facility by RN  The following physician request were entered in Epic:   Additional Comments:  Derenda Fennel, LCSW [phone]

## 2012-09-18 NOTE — Clinical Social Work Note (Signed)
Pt d/c today to [facility]. Pt and facility aware and agreeable. D/C summary faxed. Pt to transfer with RN.  Ereka Brau, LCSW [phone] 

## 2012-09-18 NOTE — Discharge Summary (Signed)
Physician Discharge Summary  Patient ID: Regina [name] MRN: [id number] DOB/AGE: June 25, 1922 79 y.o. Primary Care Physician:Jolynn Bajorek L, MD Admit date: 09/12/2012 Discharge date: 09/18/2012    Discharge Diagnoses:   Active Problems:   Fracture of femoral neck, left   Essential hypertension, benign   Unspecified hypothyroidism   Atrial fibrillation   Hypokalemia     Medication List    ASK your doctor about these medications       amLODipine 10 MG tablet  Commonly known as:  NORVASC  Take 10 mg by mouth daily.     aspirin EC 81 MG tablet  Take 81 mg by mouth daily.     levothyroxine 50 MCG tablet  Commonly known as:  SYNTHROID, LEVOTHROID  Take 50 mcg by mouth daily before breakfast.     metoprolol 50 MG tablet  Commonly known as:  LOPRESSOR  Take 50 mg by mouth daily.        Discharged Condition: Improved    Consults:orthopedics  Significant Diagnostic Studies: Dg Chest 1 View  09/12/2012  *RADIOLOGY REPORT*  Clinical Data: History of fall.  CHEST - 1 VIEW  Comparison: 08/06/2012  Findings: Supine view of the chest was obtained.  There are retrocardiac densities suggestive for a large hiatal hernia.  Heart size is upper limits normal.  Coarse lung markings appear chronic. No evidence for a pneumothorax.  No focal chest disease. Mild deformity of the proximal left humerus is suggestive for an old fracture.  IMPRESSION: Chronic lung changes without acute findings.  Hiatal hernia.   Original Report Authenticated By: Richarda Overlie, M.D.    Dg Hip Complete Left  09/12/2012  *RADIOLOGY REPORT*  Clinical Data: Fall and hip pain.  LEFT HIP - COMPLETE 2+ VIEW  Comparison: None.  Findings: There is a mildly displaced fracture of the left femoral neck.  Fracture may be mildly comminuted.  The pelvic bony ring is intact.  The left hip is located.  IMPRESSION: Mildly displaced fracture of the left femoral neck.   Original Report Authenticated By: Richarda Overlie, M.D.    Dg Hip  Operative Left  09/13/2012  *RADIOLOGY REPORT*  Clinical Data: ORIF comminuted left femoral neck fracture.  OPERATIVE LEFT HIP 2 VIEWS 09/13/2012:  Comparison: Left hip x-rays yesterday.  Findings: 3 spot images from the C-arm fluoroscopic device, AP and lateral views of the left hip, submitted for interpretation post- operatively demonstrate three compression screws traversing the basicervical left femoral neck fracture.  Alignment appears near anatomic.  The radiologic technologist documented 38 seconds of fluoroscopy time.  IMPRESSION: Near anatomic alignment post ORIF of the left femoral neck fracture.   Original Report Authenticated By: Hulan Saas, M.D.    US Abdomen Complete  09/10/2012  *RADIOLOGY REPORT*  Clinical Data:  Increased liver function tests.  COMPLETE ABDOMINAL ULTRASOUND  Comparison:  Abdominal ultrasound 06/14/2007.  Findings:  Gallbladder:  There is a large amount of echogenic material layering dependently in the gallbladder with some distal posterior acoustic shadowing.  The gallbladder wall is markedly thickened measuring up to 7 mm.  No definite pericholecystic fluid.  Per report from the sonographer, the patient did not exhibit a sonographic Murphy's sign on examination.  Common bile duct:  Markedly dilated measuring up to 1.6 cm in the region of the pancreatic head.  Liver:  The liver has a nodular contour, suggestive of cirrhosis. No focal cystic or solid hepatic lesions.  Mild intrahepatic biliary ductal dilatation.  Normal hepatopetal flow in the portal vein.  Common hepatic duct measures 6 mm.  IVC:  Patent throughout its visualized course in the abdomen.  Pancreas:  Poorly visualized secondary to overlying bowel gas.  Spleen:  Normal size and echotexture without focal parenchymal abnormality.6.0 cm in length.  Right Kidney:  8.8 cm in length.  No focal cystic or solid lesions. No hydronephrosis.  4 mm echogenic focus with posterior acoustic shadowing in the interpolar region,  suspicious for a nonobstructive calculus.  Left Kidney:  No hydronephrosis.  Well-preserved cortex.  Normal size and parenchymal echotexture without focal abnormalities. 9.4 cm in length.  Abdominal aorta:  Normal in caliber measuring 1.9 cm in diameter proximally and tapering appropriately distally.  IMPRESSION: 1.  Cholelithiasis and biliary sludge with gallbladder wall thickening, but no definite pericholecystic fluid and no sonographic Murphy's sign.  Findings are abnormal, but are not definitive for acute cholecystitis at this time.  Clinical correlation is recommended. 2.  Intra and extrahepatic biliary ductal dilatation.  The possibility of a distal stone in the common bile duct is not excluded as the pancreatic head was poorly visualized secondary to overlying bowel gas.  Clinical correlation is recommended. 3.  Probable 4 mm nonobstructive calculus in the interpolar collecting system of the right kidney.   Original Report Authenticated By: Trudie Reed, M.D.     Lab Results: Basic Metabolic Panel:  Recent Labs  40/98/11 0444 09/16/12 1836  NA 136  --   K 4.5  --   CL 101  --   CO2 31  --   GLUCOSE 110*  --   BUN 10  --   CREATININE 0.69  --   CALCIUM 8.4  --   MG  --  1.8   Liver Function Tests: No results found for this basename: AST, ALT, ALKPHOS, BILITOT, PROT, ALBUMIN,  in the last 72 hours   CBC:  Recent Labs  09/16/12 0444  WBC 8.3  NEUTROABS 5.7  HGB 12.6  HCT 38.6  MCV 90.6  PLT 334    Recent Results (from the past 240 hour(s))  URINE CULTURE     Status: None   Collection Time    09/12/12  4:08 PM      Result Value Range Status   Specimen Description URINE, CLEAN CATCH   Final   Special Requests NONE   Final   Culture  Setup Time 09/13/2012 01:30   Final   Colony Count NO GROWTH   Final   Culture NO GROWTH   Final   Report Status 09/13/2012 FINAL   Final  SURGICAL PCR SCREEN     Status: None   Collection Time    09/12/12 10:29 PM      Result  Value Range Status   MRSA, PCR NEGATIVE  NEGATIVE Final   Staphylococcus aureus NEGATIVE  NEGATIVE Final   Comment:            The Xpert SA Assay (FDA     approved for NASAL specimens     in patients over 18 years of age),     is one component of     a comprehensive surveillance     program.  Test performance has     been validated by [employer] for patients greater     than or equal to 70 year old.     It is not intended     to diagnose infection nor to     guide or monitor treatment.  Hospital Course: She was admitted after falling. She tripped over her she and failed and broke her left. She initially was noted to have PVCs and she was hypokalemic on admission and this was replaced. She underwent surgery on the day following admission. She went to the intensive care unit postop because she had a lot of PVCs and PACs and later developed atrial fibrillation and atrial flutter. This was treated and her heart rate improved. She did well with physical therapy and was ready for discharge on the 16th. She is going to a skilled care facility for rehabilitation. She has a recent diagnosis of hypothyroidism and her TSH level was still somewhat high so she needs a larger gas and thyroid. She can't be anticoagulated with Eliquis. Plans are for her to have cardioversion in approximately a month  Discharge Exam: Blood pressure 132/81, pulse 69, temperature 97.5 F (36.4 C), temperature source Oral, resp. rate 18, height 5\' 5"  (1.651 m), weight 65.1 kg (143 lb 8.3 oz), SpO2 99.00%. She is awake and alert. She is comfortable. Her heart rate is in the 60s but still somewhat irregular. Her hip appears to be healing well.  Disposition: To skilled care facility for rehabilitation. She will have a speech if needed physical therapy and occupational therapy. He will be on a no added salt regular diet. She will be on the following medications: Acetaminophen 650 mg by mouth every 6 hours when necessary  fever or mild pain Apixaban 5 mg twice a day Diltiazem CD 180 mg daily Levothyroxine 75 mcg daily Milk of magnesia 30 cc daily as needed for constipation Robaxin 500 mg every 6 hours when necessary muscle spasm Lopressor 25 mg 4 times a day Zofran 4 mg by mouth every 6 hours when necessary nausea or vomiting Omeprazole 20 mg daily Norco 5/325 4 times a day when necessary for more severe  pain She will need followup appointment with cardiology orthopedics and I will see her at the nursing home      Signed: Fredirick Maudlin Pager [phone]  09/18/2012, 8:32 AM

## 2012-09-19 DIAGNOSIS — S72009A Fracture of unspecified part of neck of unspecified femur, initial encounter for closed fracture: Secondary | ICD-10-CM | POA: Diagnosis not present

## 2012-09-21 NOTE — H&P (Signed)
Regina, [name]              ACCOUNT NO.:  [id number]  MEDICAL RECORD NO.:  [id number]  LOCATION:  S134                          FACILITY:  [facility]  PHYSICIAN:  Suzannah Bettes L. Juanetta Gosling, M.D.DATE OF BIRTH:  [birth year]  DATE OF ADMISSION:  09/18/2012 DATE OF DISCHARGE:  [facility]                             HISTORY & PHYSICAL   HISTORY OF PRESENT ILLNESS:  Regina [name] is an 79 year old, who has come to the [facility] for rehabilitation after a hip fracture.  She slipped on her house shoe and fell and hurt her left hip.  She was not able to stand, was brought to the emergency room and found to have a left femoral neck fracture.  While she was hospitalized, she had some trouble with atrial fibrillation which is a new problem.  PAST MEDICAL HISTORY:  Positive for stroke and hypertension.  PAST SURGICAL HISTORY:  Surgically, she has had some sort of mouth surgery and had surgery on her thigh.  SOCIAL HISTORY:  She does not smoke.  She does not use any alcohol or illicit drugs.  She is married and lives at home with her husband.  FAMILY HISTORY:  Essentially noncontributory and not known to be positive for osteoporosis.  REVIEW OF SYSTEMS:  She had the episode of atrial fib.  She has had an episode of abdominal pain, which has resolved.  She was recently found to be hypothyroid and her TSH was still elevated in the hospital so her dose of thyroid needs to be increased.  PHYSICAL EXAMINATION:  GENERAL:  She is awake and alert.  She looks very comfortable. VITAL SIGNS:  Pulse is about 60. HEENT:  Pupils are reactive. NECK:  Supple. CHEST:  Clear. HEART:  Regular and I cannot tell for sure she is in atrial fibrillation or not. EXTREMITIES:  Her hip looks okay. CENTRAL NERVOUS SYSTEM:  Grossly intact.  ASSESSMENT:  She is at the skilled care facility for rehabilitation.  PLAN:  To have her do PT, OT, etc.  Continue with all the other medications, and I will see her at the nursing  home.  She will eventually be planned to be discharged home.     Kingslee Mairena L. Juanetta Gosling, M.D.     ELH/MEDQ  D:  09/20/2012  T:  09/20/2012  Job:  [id number]

## 2012-10-02 ENCOUNTER — Ambulatory Visit (INDEPENDENT_AMBULATORY_CARE_PROVIDER_SITE_OTHER): Payer: Medicare Other | Admitting: Adult Health

## 2012-10-02 ENCOUNTER — Encounter: Payer: Self-pay | Admitting: Adult Health

## 2012-10-02 VITALS — BP 117/56 | HR 69 | Ht 65.0 in | Wt 130.0 lb

## 2012-10-02 DIAGNOSIS — I4891 Unspecified atrial fibrillation: Secondary | ICD-10-CM

## 2012-10-02 DIAGNOSIS — S72009A Fracture of unspecified part of neck of unspecified femur, initial encounter for closed fracture: Secondary | ICD-10-CM

## 2012-10-02 DIAGNOSIS — S72002A Fracture of unspecified part of neck of left femur, initial encounter for closed fracture: Secondary | ICD-10-CM

## 2012-10-02 MED ORDER — METOPROLOL TARTRATE 50 MG PO TABS: 50.0000 mg | ORAL_TABLET | Freq: Three times a day (TID) | ORAL | Status: DC

## 2012-10-02 NOTE — Progress Notes (Deleted)
Name: Regina [name]    DOB: 03-05-1923  Age: 79 y.o.  MR#: [id number]       PCP:  Fredirick Maudlin, MD      Insurance: Payor: MEDICARE  Plan: MEDICARE PART A AND B  Product Type: *No Product type*    CC:    Chief Complaint  Patient presents with  . Atrial Fibrillation    VS Filed Vitals:   10/02/12 1336  BP: 117/56  Pulse: 69  Height: 5\' 5"  (1.651 m)  Weight: 130 lb (58.968 kg)    Weights Current Weight  10/02/12 130 lb (58.968 kg)  09/16/12 143 lb 8.3 oz (65.1 kg)  09/16/12 143 lb 8.3 oz (65.1 kg)    Blood Pressure  BP Readings from Last 3 Encounters:  10/02/12 117/56  09/18/12 132/81  09/18/12 132/81     Admit date:  (Not on file) Last encounter with RMR:  Visit date not found   Allergy Review of patient's allergies indicates no known allergies.  No current outpatient prescriptions on file.   No current facility-administered medications for this visit.    Discontinued Meds:   There are no discontinued medications.  Patient Active Problem List   Diagnosis Date Noted  . Atypical atrial flutter 09/18/2012  . Atrial fibrillation 09/14/2012  . Hypokalemia 09/14/2012  . Fracture of femoral neck, left 09/13/2012  . Essential hypertension, benign 09/13/2012  . Unspecified hypothyroidism 09/13/2012    LABS    Component Value Date/Time   NA 136 09/16/2012 0444   NA 133* 09/15/2012 0515   NA 138 09/14/2012 0503   K 4.5 09/16/2012 0444   K 4.3 09/15/2012 0515   K 3.1* 09/14/2012 0503   CL 101 09/16/2012 0444   CL 97 09/15/2012 0515   CL 100 09/14/2012 0503   CO2 31 09/16/2012 0444   CO2 29 09/15/2012 0515   CO2 29 09/14/2012 0503   GLUCOSE 110* 09/16/2012 0444   GLUCOSE 110* 09/15/2012 0515   GLUCOSE 113* 09/14/2012 0503   BUN 10 09/16/2012 0444   BUN 10 09/15/2012 0515   BUN 13 09/14/2012 0503   CREATININE 0.69 09/16/2012 0444   CREATININE 0.67 09/15/2012 0515   CREATININE 0.76 09/14/2012 0503   CALCIUM 8.4 09/16/2012 0444   CALCIUM 8.4 09/15/2012 0515   CALCIUM 8.3*  09/14/2012 0503   GFRNONAA 76* 09/16/2012 0444   GFRNONAA 77* 09/15/2012 0515   GFRNONAA 74* 09/14/2012 0503   GFRAA 88* 09/16/2012 0444   GFRAA 89* 09/15/2012 0515   GFRAA 85* 09/14/2012 0503   CMP     Component Value Date/Time   NA 136 09/16/2012 0444   K 4.5 09/16/2012 0444   CL 101 09/16/2012 0444   CO2 31 09/16/2012 0444   GLUCOSE 110* 09/16/2012 0444   BUN 10 09/16/2012 0444   CREATININE 0.69 09/16/2012 0444   CALCIUM 8.4 09/16/2012 0444   PROT 7.0 09/12/2012 1545   ALBUMIN 2.9* 09/12/2012 1545   AST 31 09/12/2012 1545   ALT 23 09/12/2012 1545   ALKPHOS 483* 09/12/2012 1545   BILITOT 1.1 09/12/2012 1545   GFRNONAA 76* 09/16/2012 0444   GFRAA 88* 09/16/2012 0444       Component Value Date/Time   WBC 8.3 09/16/2012 0444   WBC 9.2 09/15/2012 0515   WBC 8.9 09/14/2012 0503   HGB 12.6 09/16/2012 0444   HGB 13.7 09/15/2012 0515   HGB 12.2 09/14/2012 0503   HCT 38.6 09/16/2012 0444   HCT 40.8 09/15/2012 0515  HCT 36.4 09/14/2012 0503   MCV 90.6 09/16/2012 0444   MCV 90.1 09/15/2012 0515   MCV 90.3 09/14/2012 0503    Lipid Panel  No results found for this basename: chol, trig, hdl, cholhdl, vldl, ldlcalc    ABG No results found for this basename: phart, pco2, pco2art, po2, po2art, hco3, tco2, acidbasedef, o2sat     Lab Results  Component Value Date   TSH 18.557* 09/16/2012   BNP (last 3 results) No results found for this basename: PROBNP,  in the last 8760 hours Cardiac Panel (last 3 results) No results found for this basename: CKTOTAL, CKMB, TROPONINI, RELINDX,  in the last 72 hours  Iron/TIBC/Ferritin No results found for this basename: iron, tibc, ferritin     EKG Orders placed in visit on 10/02/12  . EKG 12-LEAD     Prior Assessment and Plan Problem List as of 10/02/2012     ICD-9-CM   Fracture of femoral neck, left   Essential hypertension, benign   Unspecified hypothyroidism   Atrial fibrillation   Hypokalemia   Atypical atrial flutter       Imaging: Dg Chest 1  View  09/12/2012  *RADIOLOGY REPORT*  Clinical Data: History of fall.  CHEST - 1 VIEW  Comparison: 08/06/2012  Findings: Supine view of the chest was obtained.  There are retrocardiac densities suggestive for a large hiatal hernia.  Heart size is upper limits normal.  Coarse lung markings appear chronic. No evidence for a pneumothorax.  No focal chest disease. Mild deformity of the proximal left humerus is suggestive for an old fracture.  IMPRESSION: Chronic lung changes without acute findings.  Hiatal hernia.   Original Report Authenticated By: Richarda Overlie, M.D.    Dg Hip Complete Left  09/12/2012  *RADIOLOGY REPORT*  Clinical Data: Fall and hip pain.  LEFT HIP - COMPLETE 2+ VIEW  Comparison: None.  Findings: There is a mildly displaced fracture of the left femoral neck.  Fracture may be mildly comminuted.  The pelvic bony ring is intact.  The left hip is located.  IMPRESSION: Mildly displaced fracture of the left femoral neck.   Original Report Authenticated By: Richarda Overlie, M.D.    Dg Hip Operative Left  09/13/2012  *RADIOLOGY REPORT*  Clinical Data: ORIF comminuted left femoral neck fracture.  OPERATIVE LEFT HIP 2 VIEWS 09/13/2012:  Comparison: Left hip x-rays yesterday.  Findings: 3 spot images from the C-arm fluoroscopic device, AP and lateral views of the left hip, submitted for interpretation post- operatively demonstrate three compression screws traversing the basicervical left femoral neck fracture.  Alignment appears near anatomic.  The radiologic technologist documented 38 seconds of fluoroscopy time.  IMPRESSION: Near anatomic alignment post ORIF of the left femoral neck fracture.   Original Report Authenticated By: Hulan Saas, M.D.    US Abdomen Complete  09/10/2012  *RADIOLOGY REPORT*  Clinical Data:  Increased liver function tests.  COMPLETE ABDOMINAL ULTRASOUND  Comparison:  Abdominal ultrasound 06/14/2007.  Findings:  Gallbladder:  There is a large amount of echogenic material layering  dependently in the gallbladder with some distal posterior acoustic shadowing.  The gallbladder wall is markedly thickened measuring up to 7 mm.  No definite pericholecystic fluid.  Per report from the sonographer, the patient did not exhibit a sonographic Murphy's sign on examination.  Common bile duct:  Markedly dilated measuring up to 1.6 cm in the region of the pancreatic head.  Liver:  The liver has a nodular contour, suggestive of cirrhosis. No focal cystic  or solid hepatic lesions.  Mild intrahepatic biliary ductal dilatation.  Normal hepatopetal flow in the portal vein.  Common hepatic duct measures 6 mm.  IVC:  Patent throughout its visualized course in the abdomen.  Pancreas:  Poorly visualized secondary to overlying bowel gas.  Spleen:  Normal size and echotexture without focal parenchymal abnormality.6.0 cm in length.  Right Kidney:  8.8 cm in length.  No focal cystic or solid lesions. No hydronephrosis.  4 mm echogenic focus with posterior acoustic shadowing in the interpolar region, suspicious for a nonobstructive calculus.  Left Kidney:  No hydronephrosis.  Well-preserved cortex.  Normal size and parenchymal echotexture without focal abnormalities. 9.4 cm in length.  Abdominal aorta:  Normal in caliber measuring 1.9 cm in diameter proximally and tapering appropriately distally.  IMPRESSION: 1.  Cholelithiasis and biliary sludge with gallbladder wall thickening, but no definite pericholecystic fluid and no sonographic Murphy's sign.  Findings are abnormal, but are not definitive for acute cholecystitis at this time.  Clinical correlation is recommended. 2.  Intra and extrahepatic biliary ductal dilatation.  The possibility of a distal stone in the common bile duct is not excluded as the pancreatic head was poorly visualized secondary to overlying bowel gas.  Clinical correlation is recommended. 3.  Probable 4 mm nonobstructive calculus in the interpolar collecting system of the right kidney.   Original  Report Authenticated By: Trudie Reed, M.D.

## 2012-10-02 NOTE — Progress Notes (Signed)
   HPI Mrs. Muramoto is a 79 year old patient of Dr. Dietrich Pates were following for ongoing assessment and treatment atrial fibrillation status post hospitalization for fractured left hip. She was admitted to [facility]  hospital in April 2014. She is also being treated for  hypertension, history of CVA, with history of hypothyroidism.   She is without complaints  No Known Allergies  No current outpatient prescriptions on file.   No current facility-administered medications for this visit.    Past Medical History  Diagnosis Date  . Stroke   . Hypertension   . Hypothyroidism     Past Surgical History  Procedure Laterality Date  . Eye surgery    . Mouth sugery      Upper teeth impacted  . Hip pinning,cannulated Left 09/13/2012    Procedure: ASNIS HIP PINNING LEFT;  Surgeon: Darreld Mclean, MD;  Location: [facility];  Service: Orthopedics;  Laterality: Left;    WUJ:WJXBJY of systems complete and found to be negative unless listed above  PHYSICAL EXAM BP 117/56  Pulse 69  Ht 5\' 5"  (1.651 m)  Wt 130 lb (58.968 kg)  BMI 21.63 kg/m2 General: Well developed, well nourished, in no acute distress, sitting in a wheelchair Head: Eyes PERRLA, No xanthomas.   Normal cephalic and atramatic  Lungs: Clear bilaterally to auscultation and percussion. Heart: HRIR tachycardic   Without 1/6 systolic murmur..  Pulses are 2+ & equal.            No carotid bruit. No JVD.  No abdominal bruits. No femoral bruits. Abdomen: Bowel sounds are positive, abdomen soft and non-tender without masses or                  Hernia's noted. Msk:  Back normal, Normal strength and tone for age.Unable to walk at this time  Extremities: No clubbing, cyanosis or edema.  DP +1 Neuro: Alert and oriented X 3. Psych:  Good affect, responds appropriately  EKG: Atrial fibrillation HR 101 with PVC's.  ASSESSMENT AND PLAN

## 2012-10-02 NOTE — Assessment & Plan Note (Signed)
Ongoing physical therapy at [facility], Not yet bearing weight. Sedentary at this time.

## 2012-10-02 NOTE — Patient Instructions (Addendum)
Your physician recommends that you schedule a follow-up appointment in: ONE MONTH  Your physician has recommended you make the following change in your medication:   1) INCREASE LOPRESSOR TO 50MG  THREE TIMES DAILY  Your physician recommends that you return for lab work in: TWO WEEKS (BMET-CBC)

## 2012-10-02 NOTE — Assessment & Plan Note (Signed)
Heart rate is not well controlled on dilt and metoprolol doses. Will increase metoprolol to 150 mg daily in divided doses of TID. Will check BMET and CBC in 2 weeks. We will see her again in one month for evaluation of HR

## 2012-10-02 NOTE — Assessment & Plan Note (Signed)
Currently controlled. She will need close follow up for BP checks and with increased dose of metoprolol to 150 mg daily.

## 2012-10-03 DIAGNOSIS — S72009A Fracture of unspecified part of neck of unspecified femur, initial encounter for closed fracture: Secondary | ICD-10-CM | POA: Diagnosis not present

## 2012-10-04 NOTE — Progress Notes (Signed)
She is admitted here for rehabilitation after hip fracture. She also developed atrial fibrillation while she was in the hospital which is a new diagnosis. She has recently been diagnosed with hypothyroidism and that is being replaced. She says she feels well and has no new complaints. She's doing well with therapy. She's not having much pain.  She is awake and alert. She is sitting upright. She has a walker in front of her. Her chest is clear. Her heart is irregular with a well-controlled rate. Her abdomen is soft.   She has had a hip fracture. She has atrial fibrillation which is a relatively new diagnosis. She has hypothyroidism which is a new diagnosis. She is recovering from her hip fracture  Continue with current medications and treatments.

## 2012-10-16 ENCOUNTER — Encounter: Payer: Self-pay | Admitting: *Deleted

## 2012-10-21 ENCOUNTER — Ambulatory Visit (HOSPITAL_COMMUNITY)
Admit: 2012-10-21 | Discharge: 2012-10-21 | Disposition: A | Discharge status: Home / Self Care | Payer: Medicare Other | Attending: Orthopaedic Surgery | Admitting: Orthopaedic Surgery

## 2012-10-21 DIAGNOSIS — S72033A Displaced midcervical fracture of unspecified femur, initial encounter for closed fracture: Secondary | ICD-10-CM | POA: Insufficient documentation

## 2012-10-21 DIAGNOSIS — X58XXXA Exposure to other specified factors, initial encounter: Secondary | ICD-10-CM | POA: Insufficient documentation

## 2012-10-21 DIAGNOSIS — IMO0002 Reserved for concepts with insufficient information to code with codable children: Secondary | ICD-10-CM | POA: Diagnosis not present

## 2012-10-25 ENCOUNTER — Encounter: Payer: Self-pay | Admitting: Adult Health

## 2012-10-31 NOTE — Progress Notes (Signed)
   HPI: Regina [name] is a 79 year old patient of Dr. Dietrich Pates were following for ongoing assessment and treatment atrial fibrillation status post hospitalization for fractured left hip. She was admitted to [facility] hospital in April 2014. She is also being treated for hypertension, history of CVA, with history of hypothyroidism.   On last visit on 10/02/2012 she was increased on her dose of metoprolol to 150 mg daily. CBC and BMET as ordered. Results demonstrated hemoglobin is 12.7 hematocrit 37.8 as of 321. 137 potassium 4.2 chloride 100 CO2 30 glucose 107 creatinine 0.67. She comes today without complaint. Continues to work with PT at [facility] with improving stamina. She complains only of not feeling hungry very often.     No Known Allergies  No current outpatient prescriptions on file.   No current facility-administered medications for this visit.    Past Medical History  Diagnosis Date  . Stroke   . Hypertension   . Hypothyroidism     Past Surgical History  Procedure Laterality Date  . Eye surgery    . Mouth sugery      Upper teeth impacted  . Hip pinning,cannulated Left 09/13/2012    Procedure: ASNIS HIP PINNING LEFT;  Surgeon: Darreld Mclean, MD;  Location: [facility];  Service: Orthopedics;  Laterality: Left;    NWG:NFAOZH of systems complete and found to be negative unless listed above  PHYSICAL EXAM BP 112/72  Pulse 50  Ht 5\' 5"  (1.651 m)  Wt 124 lb (56.246 kg)  BMI 20.63 kg/m2  SpO2 92% General: Well developed, well nourished, in no acute distress sitting in wheel chair.  Head: Eyes PERRLA, No xanthomas.   Normal cephalic and atramatic  Lungs: Clear bilaterally to auscultation and percussion. Heart: HRRR S1 S2, without MRG.  Pulses are 2+ & equal.            No carotid bruit. No JVD.  No abdominal bruits. No femoral bruits. Abdomen: Bowel sounds are positive, abdomen soft and non-tender without masses or                  Hernia's noted. Msk:  Back normal, normal gait. Normal  strength and tone for age. Extremities: No clubbing, cyanosis or edema.  DP +1 Neuro: Alert and oriented X 3. Psych:  Good affect, responds appropriately  EKG: SR with 1 degree AV block.   ASSESSMENT AND PLAN

## 2012-11-01 ENCOUNTER — Encounter: Payer: Self-pay | Admitting: Adult Health

## 2012-11-01 ENCOUNTER — Ambulatory Visit (INDEPENDENT_AMBULATORY_CARE_PROVIDER_SITE_OTHER): Payer: Medicare Other | Admitting: Adult Health

## 2012-11-01 VITALS — BP 112/72 | HR 50 | Ht 65.0 in | Wt 124.0 lb

## 2012-11-01 DIAGNOSIS — I4891 Unspecified atrial fibrillation: Secondary | ICD-10-CM

## 2012-11-01 DIAGNOSIS — E039 Hypothyroidism, unspecified: Secondary | ICD-10-CM

## 2012-11-01 DIAGNOSIS — E876 Hypokalemia: Secondary | ICD-10-CM

## 2012-11-01 NOTE — Assessment & Plan Note (Signed)
Much improved on current medications. No changes at this time.

## 2012-11-01 NOTE — Assessment & Plan Note (Addendum)
HR is very slow today but remains NSR .  Will decrease metoprolol to 50 mg BID from TID. She sedentary and mildly hypotensive.  I have given parameters to [facility] for BP and HR for them to call me.

## 2012-11-01 NOTE — Assessment & Plan Note (Signed)
Due to see PCP this week. Labs and medications per PCP

## 2012-11-01 NOTE — Progress Notes (Deleted)
Name: Regina [name]    DOB: [birth year]  Age: 79 y.o.  MR#: [id number]       PCP:  Regina Maudlin, MD      Insurance: Payor: MEDICARE / Plan: MEDICARE PART A AND B / Product Type: *No Product type* /   CC:   No chief complaint on file.   VS Filed Vitals:   11/01/12 1330  BP: 112/72  Pulse: 50  Height: 5\' 5"  (1.651 m)  Weight: 124 lb (56.246 kg)  SpO2: 92%    Weights Current Weight  11/01/12 124 lb (56.246 kg)  10/02/12 130 lb (58.968 kg)  09/16/12 143 lb 8.3 oz (65.1 kg)    Blood Pressure  BP Readings from Last 3 Encounters:  11/01/12 112/72  10/02/12 117/56  09/18/12 132/81     Admit date:  (Not on file) Last encounter with RMR:  10/02/2012   Allergy Review of patient's allergies indicates no known allergies.  Current Outpatient Prescriptions  Medication Sig Dispense Refill  . metoprolol (LOPRESSOR) 50 MG tablet Take 1 tablet (50 mg total) by mouth 3 (three) times daily.  180 tablet  3   No current facility-administered medications for this visit.    Discontinued Meds:   There are no discontinued medications.  Patient Active Problem List   Diagnosis Date Noted  . Atrial fibrillation 09/14/2012  . Hypokalemia 09/14/2012  . Fracture of femoral neck, left 09/13/2012  . Essential hypertension, benign 09/13/2012  . Unspecified hypothyroidism 09/13/2012    LABS    Component Value Date/Time   NA 136 09/16/2012 0444   NA 133* 09/15/2012 0515   NA 138 09/14/2012 0503   K 4.5 09/16/2012 0444   K 4.3 09/15/2012 0515   K 3.1* 09/14/2012 0503   CL 101 09/16/2012 0444   CL 97 09/15/2012 0515   CL 100 09/14/2012 0503   CO2 31 09/16/2012 0444   CO2 29 09/15/2012 0515   CO2 29 09/14/2012 0503   GLUCOSE 110* 09/16/2012 0444   GLUCOSE 110* 09/15/2012 0515   GLUCOSE 113* 09/14/2012 0503   BUN 10 09/16/2012 0444   BUN 10 09/15/2012 0515   BUN 13 09/14/2012 0503   CREATININE 0.69 09/16/2012 0444   CREATININE 0.67 09/15/2012 0515   CREATININE 0.76 09/14/2012 0503   CALCIUM 8.4  09/16/2012 0444   CALCIUM 8.4 09/15/2012 0515   CALCIUM 8.3* 09/14/2012 0503   GFRNONAA 76* 09/16/2012 0444   GFRNONAA 77* 09/15/2012 0515   GFRNONAA 74* 09/14/2012 0503   GFRAA 88* 09/16/2012 0444   GFRAA 89* 09/15/2012 0515   GFRAA 85* 09/14/2012 0503   CMP     Component Value Date/Time   NA 136 09/16/2012 0444   K 4.5 09/16/2012 0444   CL 101 09/16/2012 0444   CO2 31 09/16/2012 0444   GLUCOSE 110* 09/16/2012 0444   BUN 10 09/16/2012 0444   CREATININE 0.69 09/16/2012 0444   CALCIUM 8.4 09/16/2012 0444   PROT 7.0 09/12/2012 1545   ALBUMIN 2.9* 09/12/2012 1545   AST 31 09/12/2012 1545   ALT 23 09/12/2012 1545   ALKPHOS 483* 09/12/2012 1545   BILITOT 1.1 09/12/2012 1545   GFRNONAA 76* 09/16/2012 0444   GFRAA 88* 09/16/2012 0444       Component Value Date/Time   WBC 8.3 09/16/2012 0444   WBC 9.2 09/15/2012 0515   WBC 8.9 09/14/2012 0503   HGB 12.6 09/16/2012 0444   HGB 13.7 09/15/2012 0515   HGB 12.2 09/14/2012 0503   HCT  38.6 09/16/2012 0444   HCT 40.8 09/15/2012 0515   HCT 36.4 09/14/2012 0503   MCV 90.6 09/16/2012 0444   MCV 90.1 09/15/2012 0515   MCV 90.3 09/14/2012 0503    Lipid Panel  No results found for this basename: chol, trig, hdl, cholhdl, vldl, ldlcalc    ABG No results found for this basename: phart, pco2, pco2art, po2, po2art, hco3, tco2, acidbasedef, o2sat     Lab Results  Component Value Date   TSH 18.557* 09/16/2012   BNP (last 3 results) No results found for this basename: PROBNP,  in the last 8760 hours Cardiac Panel (last 3 results) No results found for this basename: CKTOTAL, CKMB, TROPONINI, RELINDX,  in the last 72 hours  Iron/TIBC/Ferritin No results found for this basename: iron, tibc, ferritin     EKG Orders placed in visit on 10/02/12  . EKG 12-LEAD     Prior Assessment and Plan Problem List as of 11/01/2012   Fracture of femoral neck, left   Last Assessment & Plan   10/02/2012 Office Visit Written 10/02/2012  2:09 PM by [name], NP     Ongoing  physical therapy at [facility], Not yet bearing weight. Sedentary at this time.    Essential hypertension, benign   Last Assessment & Plan   10/02/2012 Office Visit Written 10/02/2012  2:08 PM by [name], NP     Currently controlled. She will need close follow up for BP checks and with increased dose of metoprolol to 150 mg daily.    Unspecified hypothyroidism   Atrial fibrillation   Last Assessment & Plan   10/02/2012 Office Visit Written 10/02/2012  2:08 PM by [name], NP     Heart rate is not well controlled on dilt and metoprolol doses. Will increase metoprolol to 150 mg daily in divided doses of TID. Will check BMET and CBC in 2 weeks. We will see her again in one month for evaluation of HR    Hypokalemia       Imaging: Dg Hip Complete Left  10/21/2012   *RADIOLOGY REPORT*  Clinical Data: Fixation of left hip fracture  LEFT HIP - COMPLETE 2+ VIEW  Comparison: Intraoperative C-arm films of 09/13/2012  Findings: Screw fixation of the fracture of the left subcapital femur has been performed.  There has been no apparent change in position or alignment.  IMPRESSION: Fixation of left subcapital femoral fracture.   Original Report Authenticated By: Dwyane Dee, M.D.

## 2012-11-01 NOTE — Patient Instructions (Addendum)
Your physician recommends that you schedule a follow-up appointment in: 6 MONTHS  Your physician has recommended you make the following change in your medication:   1) DECREASE METOPROLOL TO 50MG  TO TWICE DAILY   Your physician recommends THAT IF YOUR BLOOD PRESSURE (SYSTOLIC-TOP NUMBER) IS BELOW [phone] OR YOUR HEART RATE GOES BELOW 50 CALL THE OFFICE [phone] TO LET KL KNOW

## 2012-11-03 DIAGNOSIS — D649 Anemia, unspecified: Secondary | ICD-10-CM | POA: Diagnosis not present

## 2012-11-03 DIAGNOSIS — S72009A Fracture of unspecified part of neck of unspecified femur, initial encounter for closed fracture: Secondary | ICD-10-CM | POA: Diagnosis not present

## 2012-11-03 DIAGNOSIS — R634 Abnormal weight loss: Secondary | ICD-10-CM | POA: Diagnosis not present

## 2012-11-03 DIAGNOSIS — I10 Essential (primary) hypertension: Secondary | ICD-10-CM | POA: Diagnosis not present

## 2012-11-04 NOTE — Progress Notes (Signed)
She says she feels well. She has no complaints. She is progressing with physical therapy.  She is awake and alert. She looks comfortable. She is still in atrial fibrillation with a well-controlled ventricular response. Her chest is clear. Her heart is regular. She is in a wheelchair.  She has a fractured hip. This was complicated by atrial fibrillation. She is improving.  Continue current treatments. Apparently her hip is not healing as quickly as she would like

## 2012-11-05 ENCOUNTER — Encounter: Payer: Self-pay | Admitting: Adult Health

## 2012-11-07 DIAGNOSIS — I4891 Unspecified atrial fibrillation: Secondary | ICD-10-CM | POA: Diagnosis not present

## 2012-11-07 DIAGNOSIS — D649 Anemia, unspecified: Secondary | ICD-10-CM | POA: Diagnosis not present

## 2012-11-07 DIAGNOSIS — E039 Hypothyroidism, unspecified: Secondary | ICD-10-CM | POA: Diagnosis not present

## 2012-11-07 DIAGNOSIS — I10 Essential (primary) hypertension: Secondary | ICD-10-CM | POA: Diagnosis not present

## 2012-11-18 ENCOUNTER — Ambulatory Visit (HOSPITAL_COMMUNITY)
Admit: 2012-11-18 | Discharge: 2012-11-18 | Disposition: A | Discharge status: Home / Self Care | Payer: Medicare Other | Attending: Pulmonary Disease | Admitting: Pulmonary Disease

## 2012-11-18 DIAGNOSIS — IMO0002 Reserved for concepts with insufficient information to code with codable children: Secondary | ICD-10-CM | POA: Diagnosis not present

## 2012-11-18 DIAGNOSIS — X58XXXA Exposure to other specified factors, initial encounter: Secondary | ICD-10-CM | POA: Insufficient documentation

## 2012-11-18 DIAGNOSIS — S72033A Displaced midcervical fracture of unspecified femur, initial encounter for closed fracture: Secondary | ICD-10-CM | POA: Insufficient documentation

## 2012-11-18 DIAGNOSIS — S72009A Fracture of unspecified part of neck of unspecified femur, initial encounter for closed fracture: Secondary | ICD-10-CM | POA: Diagnosis not present

## 2012-12-03 DIAGNOSIS — R279 Unspecified lack of coordination: Secondary | ICD-10-CM | POA: Diagnosis not present

## 2012-12-03 DIAGNOSIS — I10 Essential (primary) hypertension: Secondary | ICD-10-CM | POA: Diagnosis not present

## 2012-12-03 DIAGNOSIS — M6281 Muscle weakness (generalized): Secondary | ICD-10-CM | POA: Diagnosis not present

## 2012-12-03 DIAGNOSIS — I4891 Unspecified atrial fibrillation: Secondary | ICD-10-CM | POA: Diagnosis not present

## 2012-12-03 DIAGNOSIS — I69920 Aphasia following unspecified cerebrovascular disease: Secondary | ICD-10-CM | POA: Diagnosis not present

## 2012-12-03 DIAGNOSIS — R262 Difficulty in walking, not elsewhere classified: Secondary | ICD-10-CM | POA: Diagnosis not present

## 2012-12-03 DIAGNOSIS — Z9181 History of falling: Secondary | ICD-10-CM | POA: Diagnosis not present

## 2012-12-03 DIAGNOSIS — I69919 Unspecified symptoms and signs involving cognitive functions following unspecified cerebrovascular disease: Secondary | ICD-10-CM | POA: Diagnosis not present

## 2012-12-03 DIAGNOSIS — E039 Hypothyroidism, unspecified: Secondary | ICD-10-CM | POA: Diagnosis not present

## 2012-12-03 DIAGNOSIS — S72009D Fracture of unspecified part of neck of unspecified femur, subsequent encounter for closed fracture with routine healing: Secondary | ICD-10-CM | POA: Diagnosis not present

## 2012-12-19 DIAGNOSIS — S72009A Fracture of unspecified part of neck of unspecified femur, initial encounter for closed fracture: Secondary | ICD-10-CM | POA: Diagnosis not present

## 2012-12-19 DIAGNOSIS — I10 Essential (primary) hypertension: Secondary | ICD-10-CM | POA: Diagnosis not present

## 2012-12-26 DIAGNOSIS — S72009D Fracture of unspecified part of neck of unspecified femur, subsequent encounter for closed fracture with routine healing: Secondary | ICD-10-CM | POA: Diagnosis not present

## 2012-12-26 DIAGNOSIS — I10 Essential (primary) hypertension: Secondary | ICD-10-CM | POA: Diagnosis not present

## 2012-12-26 DIAGNOSIS — R9389 Abnormal findings on diagnostic imaging of other specified body structures: Secondary | ICD-10-CM | POA: Diagnosis not present

## 2012-12-26 DIAGNOSIS — Z5189 Encounter for other specified aftercare: Secondary | ICD-10-CM | POA: Diagnosis not present

## 2012-12-26 DIAGNOSIS — I4891 Unspecified atrial fibrillation: Secondary | ICD-10-CM | POA: Diagnosis not present

## 2012-12-30 DIAGNOSIS — S72009D Fracture of unspecified part of neck of unspecified femur, subsequent encounter for closed fracture with routine healing: Secondary | ICD-10-CM | POA: Diagnosis not present

## 2012-12-30 DIAGNOSIS — D649 Anemia, unspecified: Secondary | ICD-10-CM | POA: Diagnosis not present

## 2012-12-30 DIAGNOSIS — S72009A Fracture of unspecified part of neck of unspecified femur, initial encounter for closed fracture: Secondary | ICD-10-CM | POA: Diagnosis not present

## 2012-12-30 DIAGNOSIS — I10 Essential (primary) hypertension: Secondary | ICD-10-CM | POA: Diagnosis not present

## 2012-12-30 DIAGNOSIS — I4891 Unspecified atrial fibrillation: Secondary | ICD-10-CM | POA: Diagnosis not present

## 2012-12-30 DIAGNOSIS — Z5189 Encounter for other specified aftercare: Secondary | ICD-10-CM | POA: Diagnosis not present

## 2012-12-31 DIAGNOSIS — I4891 Unspecified atrial fibrillation: Secondary | ICD-10-CM | POA: Diagnosis not present

## 2012-12-31 DIAGNOSIS — I10 Essential (primary) hypertension: Secondary | ICD-10-CM | POA: Diagnosis not present

## 2012-12-31 DIAGNOSIS — D649 Anemia, unspecified: Secondary | ICD-10-CM | POA: Diagnosis not present

## 2013-01-01 DIAGNOSIS — S72009D Fracture of unspecified part of neck of unspecified femur, subsequent encounter for closed fracture with routine healing: Secondary | ICD-10-CM | POA: Diagnosis not present

## 2013-01-01 DIAGNOSIS — I4891 Unspecified atrial fibrillation: Secondary | ICD-10-CM | POA: Diagnosis not present

## 2013-01-01 DIAGNOSIS — Z5189 Encounter for other specified aftercare: Secondary | ICD-10-CM | POA: Diagnosis not present

## 2013-01-01 DIAGNOSIS — I10 Essential (primary) hypertension: Secondary | ICD-10-CM | POA: Diagnosis not present

## 2013-01-03 DIAGNOSIS — Z5189 Encounter for other specified aftercare: Secondary | ICD-10-CM | POA: Diagnosis not present

## 2013-01-03 DIAGNOSIS — S72009D Fracture of unspecified part of neck of unspecified femur, subsequent encounter for closed fracture with routine healing: Secondary | ICD-10-CM | POA: Diagnosis not present

## 2013-01-03 DIAGNOSIS — I10 Essential (primary) hypertension: Secondary | ICD-10-CM | POA: Diagnosis not present

## 2013-01-03 DIAGNOSIS — I4891 Unspecified atrial fibrillation: Secondary | ICD-10-CM | POA: Diagnosis not present

## 2013-01-06 DIAGNOSIS — Z5189 Encounter for other specified aftercare: Secondary | ICD-10-CM | POA: Diagnosis not present

## 2013-01-06 DIAGNOSIS — I4891 Unspecified atrial fibrillation: Secondary | ICD-10-CM | POA: Diagnosis not present

## 2013-01-06 DIAGNOSIS — S72009D Fracture of unspecified part of neck of unspecified femur, subsequent encounter for closed fracture with routine healing: Secondary | ICD-10-CM | POA: Diagnosis not present

## 2013-01-06 DIAGNOSIS — I10 Essential (primary) hypertension: Secondary | ICD-10-CM | POA: Diagnosis not present

## 2013-01-08 DIAGNOSIS — I4891 Unspecified atrial fibrillation: Secondary | ICD-10-CM | POA: Diagnosis not present

## 2013-01-08 DIAGNOSIS — I10 Essential (primary) hypertension: Secondary | ICD-10-CM | POA: Diagnosis not present

## 2013-01-08 DIAGNOSIS — Z5189 Encounter for other specified aftercare: Secondary | ICD-10-CM | POA: Diagnosis not present

## 2013-01-08 DIAGNOSIS — S72009D Fracture of unspecified part of neck of unspecified femur, subsequent encounter for closed fracture with routine healing: Secondary | ICD-10-CM | POA: Diagnosis not present

## 2013-01-10 DIAGNOSIS — Z5189 Encounter for other specified aftercare: Secondary | ICD-10-CM | POA: Diagnosis not present

## 2013-01-10 DIAGNOSIS — S72009D Fracture of unspecified part of neck of unspecified femur, subsequent encounter for closed fracture with routine healing: Secondary | ICD-10-CM | POA: Diagnosis not present

## 2013-01-10 DIAGNOSIS — I4891 Unspecified atrial fibrillation: Secondary | ICD-10-CM | POA: Diagnosis not present

## 2013-01-10 DIAGNOSIS — I10 Essential (primary) hypertension: Secondary | ICD-10-CM | POA: Diagnosis not present

## 2013-01-13 DIAGNOSIS — S72009D Fracture of unspecified part of neck of unspecified femur, subsequent encounter for closed fracture with routine healing: Secondary | ICD-10-CM | POA: Diagnosis not present

## 2013-01-13 DIAGNOSIS — Z5189 Encounter for other specified aftercare: Secondary | ICD-10-CM | POA: Diagnosis not present

## 2013-01-13 DIAGNOSIS — I10 Essential (primary) hypertension: Secondary | ICD-10-CM | POA: Diagnosis not present

## 2013-01-13 DIAGNOSIS — I4891 Unspecified atrial fibrillation: Secondary | ICD-10-CM | POA: Diagnosis not present

## 2013-01-15 DIAGNOSIS — I10 Essential (primary) hypertension: Secondary | ICD-10-CM | POA: Diagnosis not present

## 2013-01-15 DIAGNOSIS — S72009D Fracture of unspecified part of neck of unspecified femur, subsequent encounter for closed fracture with routine healing: Secondary | ICD-10-CM | POA: Diagnosis not present

## 2013-01-15 DIAGNOSIS — I4891 Unspecified atrial fibrillation: Secondary | ICD-10-CM | POA: Diagnosis not present

## 2013-01-15 DIAGNOSIS — Z5189 Encounter for other specified aftercare: Secondary | ICD-10-CM | POA: Diagnosis not present

## 2013-01-16 DIAGNOSIS — I10 Essential (primary) hypertension: Secondary | ICD-10-CM | POA: Diagnosis not present

## 2013-01-16 DIAGNOSIS — Z5189 Encounter for other specified aftercare: Secondary | ICD-10-CM | POA: Diagnosis not present

## 2013-01-16 DIAGNOSIS — I4891 Unspecified atrial fibrillation: Secondary | ICD-10-CM | POA: Diagnosis not present

## 2013-01-16 DIAGNOSIS — S72009D Fracture of unspecified part of neck of unspecified femur, subsequent encounter for closed fracture with routine healing: Secondary | ICD-10-CM | POA: Diagnosis not present

## 2013-01-20 DIAGNOSIS — I4891 Unspecified atrial fibrillation: Secondary | ICD-10-CM | POA: Diagnosis not present

## 2013-01-20 DIAGNOSIS — Z5189 Encounter for other specified aftercare: Secondary | ICD-10-CM | POA: Diagnosis not present

## 2013-01-20 DIAGNOSIS — S72009D Fracture of unspecified part of neck of unspecified femur, subsequent encounter for closed fracture with routine healing: Secondary | ICD-10-CM | POA: Diagnosis not present

## 2013-01-20 DIAGNOSIS — I10 Essential (primary) hypertension: Secondary | ICD-10-CM | POA: Diagnosis not present

## 2013-01-21 DIAGNOSIS — S72009A Fracture of unspecified part of neck of unspecified femur, initial encounter for closed fracture: Secondary | ICD-10-CM | POA: Diagnosis not present

## 2013-01-21 DIAGNOSIS — I10 Essential (primary) hypertension: Secondary | ICD-10-CM | POA: Diagnosis not present

## 2013-01-22 DIAGNOSIS — I4891 Unspecified atrial fibrillation: Secondary | ICD-10-CM | POA: Diagnosis not present

## 2013-01-22 DIAGNOSIS — Z5189 Encounter for other specified aftercare: Secondary | ICD-10-CM | POA: Diagnosis not present

## 2013-01-22 DIAGNOSIS — S72009D Fracture of unspecified part of neck of unspecified femur, subsequent encounter for closed fracture with routine healing: Secondary | ICD-10-CM | POA: Diagnosis not present

## 2013-01-22 DIAGNOSIS — I10 Essential (primary) hypertension: Secondary | ICD-10-CM | POA: Diagnosis not present

## 2013-01-23 DIAGNOSIS — Z5189 Encounter for other specified aftercare: Secondary | ICD-10-CM | POA: Diagnosis not present

## 2013-01-23 DIAGNOSIS — I10 Essential (primary) hypertension: Secondary | ICD-10-CM | POA: Diagnosis not present

## 2013-01-23 DIAGNOSIS — S72009D Fracture of unspecified part of neck of unspecified femur, subsequent encounter for closed fracture with routine healing: Secondary | ICD-10-CM | POA: Diagnosis not present

## 2013-01-23 DIAGNOSIS — I4891 Unspecified atrial fibrillation: Secondary | ICD-10-CM | POA: Diagnosis not present

## 2013-01-27 DIAGNOSIS — I4891 Unspecified atrial fibrillation: Secondary | ICD-10-CM | POA: Diagnosis not present

## 2013-01-27 DIAGNOSIS — Z5189 Encounter for other specified aftercare: Secondary | ICD-10-CM | POA: Diagnosis not present

## 2013-01-27 DIAGNOSIS — I10 Essential (primary) hypertension: Secondary | ICD-10-CM | POA: Diagnosis not present

## 2013-01-27 DIAGNOSIS — S72009D Fracture of unspecified part of neck of unspecified femur, subsequent encounter for closed fracture with routine healing: Secondary | ICD-10-CM | POA: Diagnosis not present

## 2013-01-29 DIAGNOSIS — I4891 Unspecified atrial fibrillation: Secondary | ICD-10-CM | POA: Diagnosis not present

## 2013-01-29 DIAGNOSIS — I10 Essential (primary) hypertension: Secondary | ICD-10-CM | POA: Diagnosis not present

## 2013-01-29 DIAGNOSIS — Z5189 Encounter for other specified aftercare: Secondary | ICD-10-CM | POA: Diagnosis not present

## 2013-01-29 DIAGNOSIS — S72009D Fracture of unspecified part of neck of unspecified femur, subsequent encounter for closed fracture with routine healing: Secondary | ICD-10-CM | POA: Diagnosis not present

## 2013-01-30 DIAGNOSIS — R17 Unspecified jaundice: Secondary | ICD-10-CM | POA: Diagnosis not present

## 2013-01-30 DIAGNOSIS — I10 Essential (primary) hypertension: Secondary | ICD-10-CM | POA: Diagnosis not present

## 2013-01-30 DIAGNOSIS — Z5189 Encounter for other specified aftercare: Secondary | ICD-10-CM | POA: Diagnosis not present

## 2013-01-30 DIAGNOSIS — S72009D Fracture of unspecified part of neck of unspecified femur, subsequent encounter for closed fracture with routine healing: Secondary | ICD-10-CM | POA: Diagnosis not present

## 2013-01-30 DIAGNOSIS — I4891 Unspecified atrial fibrillation: Secondary | ICD-10-CM | POA: Diagnosis not present

## 2013-02-05 DIAGNOSIS — I4891 Unspecified atrial fibrillation: Secondary | ICD-10-CM | POA: Diagnosis not present

## 2013-02-05 DIAGNOSIS — I10 Essential (primary) hypertension: Secondary | ICD-10-CM | POA: Diagnosis not present

## 2013-02-05 DIAGNOSIS — S72009D Fracture of unspecified part of neck of unspecified femur, subsequent encounter for closed fracture with routine healing: Secondary | ICD-10-CM | POA: Diagnosis not present

## 2013-02-05 DIAGNOSIS — Z5189 Encounter for other specified aftercare: Secondary | ICD-10-CM | POA: Diagnosis not present

## 2013-02-07 DIAGNOSIS — I10 Essential (primary) hypertension: Secondary | ICD-10-CM | POA: Diagnosis not present

## 2013-02-07 DIAGNOSIS — Z5189 Encounter for other specified aftercare: Secondary | ICD-10-CM | POA: Diagnosis not present

## 2013-02-07 DIAGNOSIS — I4891 Unspecified atrial fibrillation: Secondary | ICD-10-CM | POA: Diagnosis not present

## 2013-02-07 DIAGNOSIS — S72009D Fracture of unspecified part of neck of unspecified femur, subsequent encounter for closed fracture with routine healing: Secondary | ICD-10-CM | POA: Diagnosis not present

## 2013-02-25 ENCOUNTER — Encounter: Payer: Self-pay | Admitting: Cardiovascular Disease

## 2013-02-25 DIAGNOSIS — Z23 Encounter for immunization: Secondary | ICD-10-CM | POA: Diagnosis not present

## 2013-02-25 DIAGNOSIS — E039 Hypothyroidism, unspecified: Secondary | ICD-10-CM | POA: Diagnosis not present

## 2013-02-25 DIAGNOSIS — R7989 Other specified abnormal findings of blood chemistry: Secondary | ICD-10-CM | POA: Diagnosis not present

## 2013-02-25 DIAGNOSIS — IMO0001 Reserved for inherently not codable concepts without codable children: Secondary | ICD-10-CM | POA: Diagnosis not present

## 2013-02-25 DIAGNOSIS — S72009A Fracture of unspecified part of neck of unspecified femur, initial encounter for closed fracture: Secondary | ICD-10-CM | POA: Diagnosis not present

## 2013-02-25 DIAGNOSIS — E871 Hypo-osmolality and hyponatremia: Secondary | ICD-10-CM | POA: Diagnosis not present

## 2013-03-04 DIAGNOSIS — I10 Essential (primary) hypertension: Secondary | ICD-10-CM | POA: Diagnosis not present

## 2013-03-04 DIAGNOSIS — S72009A Fracture of unspecified part of neck of unspecified femur, initial encounter for closed fracture: Secondary | ICD-10-CM | POA: Diagnosis not present

## 2013-03-10 DIAGNOSIS — I10 Essential (primary) hypertension: Secondary | ICD-10-CM | POA: Diagnosis not present

## 2013-03-10 DIAGNOSIS — S72009A Fracture of unspecified part of neck of unspecified femur, initial encounter for closed fracture: Secondary | ICD-10-CM | POA: Diagnosis not present

## 2013-04-28 ENCOUNTER — Ambulatory Visit: Payer: Medicare Other | Admitting: Cardiovascular Disease

## 2013-05-14 ENCOUNTER — Encounter: Payer: Self-pay | Admitting: Cardiovascular Disease

## 2013-05-14 ENCOUNTER — Ambulatory Visit (INDEPENDENT_AMBULATORY_CARE_PROVIDER_SITE_OTHER): Payer: Medicare Other | Admitting: Cardiovascular Disease

## 2013-05-14 VITALS — BP 153/67 | HR 52 | Ht 64.0 in | Wt 117.0 lb

## 2013-05-14 DIAGNOSIS — I4891 Unspecified atrial fibrillation: Secondary | ICD-10-CM | POA: Diagnosis not present

## 2013-05-14 DIAGNOSIS — Z7901 Long term (current) use of anticoagulants: Secondary | ICD-10-CM

## 2013-05-14 DIAGNOSIS — I10 Essential (primary) hypertension: Secondary | ICD-10-CM

## 2013-05-14 DIAGNOSIS — S72009A Fracture of unspecified part of neck of unspecified femur, initial encounter for closed fracture: Secondary | ICD-10-CM | POA: Diagnosis not present

## 2013-05-14 DIAGNOSIS — S72002A Fracture of unspecified part of neck of left femur, initial encounter for closed fracture: Secondary | ICD-10-CM

## 2013-05-14 MED ORDER — METOPROLOL TARTRATE 25 MG PO TABS: 25.0000 mg | ORAL_TABLET | Freq: Two times a day (BID) | ORAL | Status: DC

## 2013-05-14 MED ORDER — APIXABAN 2.5 MG PO TABS: 2.5000 mg | ORAL_TABLET | Freq: Two times a day (BID) | ORAL | Status: DC

## 2013-05-14 NOTE — Progress Notes (Signed)
Patient ID: Regina [name], female   DOB: May 19, 1923, 79 y.o.   MRN: [id number]      SUBJECTIVE: The patient is an 79 year old woman with a history of atrial fibrillation, hypertension, hypothyroidism and a history of a CVA. She is here with her daughter Linton Rump, today. She was not aware when she was in atrial fibrillation. She presently denies chest pain, shortness of breath, lightheadedness, dizziness, and leg swelling. She has a fractured left hip and has been told she needs hip replacement surgery.  She has not had any bleeding problems with apixaban. It has been quite costly for her, however, costing her $400 every month. Both she and her daughter have several questions with regards to this medication and if other options are available.  An echocardiogram performed in April 2014 showed normal left ventricular systolic function with an ejection fraction of 60% and very mild aortic stenosis, along with mitral and tricuspid valvular regurgitation.  Her husband, who was a [employer] fundraiser, passed away.    No Known Allergies  Current Outpatient Prescriptions  Medication Sig Dispense Refill  . acetaminophen (TYLENOL) 650 MG CR tablet Take 650 mg by mouth every 8 (eight) hours as needed for pain.      Marland Kitchen apixaban (ELIQUIS) 5 MG TABS tablet Take by mouth 2 (two) times daily.      Marland Kitchen diltiazem (CARDIZEM CD) 180 MG 24 hr capsule Take 180 mg by mouth daily.      Marland Kitchen HYDROcodone-acetaminophen (NORCO/VICODIN) 5-325 MG per tablet Take 1 tablet by mouth every 6 (six) hours as needed for pain.      Marland Kitchen levothyroxine (SYNTHROID, LEVOTHROID) 75 MCG tablet Take 75 mcg by mouth daily before breakfast.      . magnesium hydroxide (MILK OF MAGNESIA) 400 MG/5ML suspension Take by mouth daily as needed for constipation.      . methocarbamol (ROBAXIN) 500 MG tablet Take 500 mg by mouth 4 (four) times daily. As needed      . metoprolol (LOPRESSOR) 50 MG tablet Take 50 mg by mouth 2 (two) times daily.      Marland Kitchen omeprazole (PRILOSEC)  20 MG capsule Take 20 mg by mouth daily.      . ondansetron (ZOFRAN) 4 MG tablet Take 4 mg by mouth every 8 (eight) hours as needed for nausea.       No current facility-administered medications for this visit.    Past Medical History  Diagnosis Date  . Stroke   . Hypertension   . Hypothyroidism     Past Surgical History  Procedure Laterality Date  . Eye surgery    . Mouth sugery      Upper teeth impacted  . Hip pinning,cannulated Left 09/13/2012    Procedure: ASNIS HIP PINNING LEFT;  Surgeon: Darreld Mclean, MD;  Location: [facility];  Service: Orthopedics;  Laterality: Left;       Filed Vitals:   05/14/13 1315  Height: 5\' 4"  (1.626 m)  Weight: 117 lb (53.071 kg)   BP: 153/67 HR: 52 bpm  PHYSICAL EXAM General: NAD Neck: No JVD, no thyromegaly or thyroid nodule.  Lungs: Clear to auscultation bilaterally with normal respiratory effort. CV: Nondisplaced PMI.  Heart regular S1/S2, no S3/S4, II/VI ejection systolic murmur over RUSB.  No peripheral edema.  No carotid bruit.  Normal pedal pulses.  Abdomen: Soft, nontender, no hepatosplenomegaly, no distention.  Neurologic: Alert and oriented x 3.  Psych: Normal affect. Extremities: No clubbing or cyanosis.   ECG: reviewed and available in electronic  records.      ASSESSMENT AND PLAN: 1. Atrial fibrillation: given her elderly age, I will reduce apixaban dose to 2.5 mg bid. She would prefer not to be on warfarin. Given her bradycardia, I will reduce metoprolol to 25 mg bid. Her CHADS-Vasc score is 6, giving her an adjusted annual ischemic stroke rate of 9.7% (high risk), thus anticoagulation is recommended. However, she has had problems with anemia in the past which required transfusions. She denies known GI bleeding. For the time being, she will continue apixaban but she may decide to avoid anticoagulation altogether. 2. HTN: relatively stable for age. Will monitor.  Time spent: 40 minutes.  Dispo: f/u 6 months.  Prentice Docker, M.D., F.A.C.C.

## 2013-05-14 NOTE — Patient Instructions (Addendum)
Your physician wants you to follow-up in: 6 months  You will receive a reminder letter in the mail two months in advance. If you don't receive a letter, please call our office to schedule the follow-up appointment.  Your physician has recommended you make the following change in your medication:   1) DECREASE METOPROLOL TO 25MG  TWICE DAILY 2) DECREASE ELIQUIS TO 2.5MG  TWICE DAILY

## 2013-07-15 DIAGNOSIS — M545 Low back pain, unspecified: Secondary | ICD-10-CM | POA: Diagnosis not present

## 2013-07-15 DIAGNOSIS — I10 Essential (primary) hypertension: Secondary | ICD-10-CM | POA: Diagnosis not present

## 2013-07-15 DIAGNOSIS — M25559 Pain in unspecified hip: Secondary | ICD-10-CM | POA: Diagnosis not present

## 2013-07-28 DIAGNOSIS — I4891 Unspecified atrial fibrillation: Secondary | ICD-10-CM | POA: Diagnosis not present

## 2013-07-28 DIAGNOSIS — S72009A Fracture of unspecified part of neck of unspecified femur, initial encounter for closed fracture: Secondary | ICD-10-CM | POA: Diagnosis not present

## 2013-07-28 DIAGNOSIS — E039 Hypothyroidism, unspecified: Secondary | ICD-10-CM | POA: Diagnosis not present

## 2013-08-06 DIAGNOSIS — M76899 Other specified enthesopathies of unspecified lower limb, excluding foot: Secondary | ICD-10-CM | POA: Diagnosis not present

## 2013-08-06 DIAGNOSIS — M25559 Pain in unspecified hip: Secondary | ICD-10-CM | POA: Diagnosis not present

## 2013-08-08 ENCOUNTER — Telehealth: Payer: Self-pay | Admitting: Cardiovascular Disease

## 2013-08-08 NOTE — Telephone Encounter (Signed)
Needs surgical clearance for hip replacement.  Will patient need to be seen to get clearance.  Was just seen in 12/14. / tgs

## 2013-08-08 NOTE — Telephone Encounter (Signed)
Will forward question to Dr.Koneswaran

## 2013-08-15 NOTE — Telephone Encounter (Signed)
I will need to evaluate patient, as she may require a nuclear MPI study prior to surgery.

## 2013-08-19 ENCOUNTER — Encounter: Payer: Self-pay | Admitting: *Deleted

## 2013-08-19 ENCOUNTER — Ambulatory Visit (INDEPENDENT_AMBULATORY_CARE_PROVIDER_SITE_OTHER): Payer: Medicare Other | Admitting: Cardiovascular Disease

## 2013-08-19 VITALS — BP 118/70 | HR 65 | Ht 65.0 in | Wt 119.0 lb

## 2013-08-19 DIAGNOSIS — I4891 Unspecified atrial fibrillation: Secondary | ICD-10-CM

## 2013-08-19 DIAGNOSIS — I10 Essential (primary) hypertension: Secondary | ICD-10-CM

## 2013-08-19 DIAGNOSIS — Z01818 Encounter for other preprocedural examination: Secondary | ICD-10-CM | POA: Diagnosis not present

## 2013-08-19 NOTE — Progress Notes (Signed)
Patient ID: Regina [name], female   DOB: 1922-11-22, 80 y.o.   MRN: [id number]      SUBJECTIVE: The patient is an 80 year old woman with a history of atrial fibrillation, hypertension, hypothyroidism and a history of a CVA.  She presently denies chest pain, shortness of breath, lightheadedness, dizziness, and leg swelling. She has a fractured left hip and is planning to have hip replacement surgery.  She has never had a stress test. She fractured her hip in April 2014. Prior to this, she was able to walk up and down stairs at least twice a day without exertional chest pain or dyspnea. An echocardiogram performed in April 2014 showed normal left ventricular systolic function with an ejection fraction of 60% and very mild aortic stenosis, along with mitral and tricuspid valvular regurgitation.         No Known Allergies  Current Outpatient Prescriptions  Medication Sig Dispense Refill  . acetaminophen (TYLENOL) 650 MG CR tablet Take 650 mg by mouth every 8 (eight) hours as needed for pain.      Marland Kitchen apixaban (ELIQUIS) 2.5 MG TABS tablet Take 1 tablet (2.5 mg total) by mouth 2 (two) times daily.  60 tablet  6  . diltiazem (CARDIZEM CD) 180 MG 24 hr capsule Take 180 mg by mouth daily.      Marland Kitchen HYDROcodone-acetaminophen (NORCO/VICODIN) 5-325 MG per tablet Take 1 tablet by mouth every 6 (six) hours as needed for pain.      Marland Kitchen levothyroxine (SYNTHROID, LEVOTHROID) 75 MCG tablet Take 100 mcg by mouth daily before breakfast.       . magnesium hydroxide (MILK OF MAGNESIA) 400 MG/5ML suspension Take by mouth daily as needed for constipation.      . methocarbamol (ROBAXIN) 500 MG tablet Take 500 mg by mouth daily. As needed      . metoprolol tartrate (LOPRESSOR) 25 MG tablet Take 1 tablet (25 mg total) by mouth 2 (two) times daily.  180 tablet  1  . omeprazole (PRILOSEC) 20 MG capsule Take 20 mg by mouth daily.      . traMADol (ULTRAM) 50 MG tablet Take 50 mg by mouth every 6 (six) hours as needed.        No current facility-administered medications for this visit.    Past Medical History  Diagnosis Date  . Stroke   . Hypertension   . Hypothyroidism     Past Surgical History  Procedure Laterality Date  . Eye surgery    . Mouth sugery      Upper teeth impacted  . Hip pinning,cannulated Left 09/13/2012    Procedure: ASNIS HIP PINNING LEFT;  Surgeon: Sanjuana Kava, MD;  Location: [facility];  Service: Orthopedics;  Laterality: Left;    History   Social History  . Marital Status: Married    Spouse Name: N/A    Number of Children: N/A  . Years of Education: N/A   Occupational History  . Not on file.   Social History Main Topics  . Smoking status: Never Smoker   . Smokeless tobacco: Never Used  . Alcohol Use: No     Comment: None since Christmas 2013  . Drug Use: No  . Sexual Activity: Not on file   Other Topics Concern  . Not on file   Social History Narrative  . No narrative on file     Filed Vitals:   08/19/13 1142  Pulse: 65  Height: 5\' 5"  (1.651 m)  Weight: 119 lb (53.978 kg)  SpO2: 97%   BP 118/70  Pulse 65   PHYSICAL EXAM General: NAD  Neck: No JVD, no thyromegaly or thyroid nodule.  Lungs: Clear to auscultation bilaterally with normal respiratory effort.  CV: Nondisplaced PMI. Heart irregular rhythm, normal rate, normal S1/S2, no S3, II/VI ejection systolic murmur over RUSB. No peripheral edema. No carotid bruit. Normal pedal pulses.  Abdomen: Soft, nontender, no hepatosplenomegaly, no distention.  Neurologic: Alert and oriented x 3.  Psych: Normal affect.  Extremities: No clubbing or cyanosis.    ECG: reviewed and available in electronic records.      ASSESSMENT AND PLAN: 1. Atrial fibrillation: given her elderly age, I will continue apixaban dose to 2.5 mg bid. She would prefer not to be on warfarin. I will also continue metoprolol 25 mg bid. Her CHADS-Vasc score is 6, giving her an adjusted annual ischemic stroke rate of 9.7% (high risk),  thus anticoagulation is recommended. However, she has had problems with anemia in the past which required transfusions. She denies known GI bleeding. For the time being, she will continue apixaban but she may decide to avoid anticoagulation altogether.  2. HTN: well controlled on current therapy. 3. Preoperative risk stratification: given her age and comorbidities, and given the inability to assess functional status due to her hip fracture, I will proceed with a Lexiscan Cardiolite to evaluate for ischemia. As per the 2014 ACC/AHA guidelines, she is most likely at an elevated risk (>/= 1%) for a major adverse cardiac event.   Dispo: f/u 6 months.    Kate Sable, M.D., F.A.C.C.

## 2013-08-19 NOTE — Patient Instructions (Signed)
Your physician has requested that you have a lexiscan myoview. For further information please visit [email or web address]. Please follow instruction sheet, as given. Office will contact with results via phone or letter.   Continue all current medications. Your physician wants you to follow up in: 6 months.  You will receive a reminder letter in the mail one-two months in advance.  If you don't receive a letter, please call our office to schedule the follow up appointment    

## 2013-08-26 ENCOUNTER — Encounter (HOSPITAL_COMMUNITY)
Admission: RE | Admit: 2013-08-26 | Discharge: 2013-08-26 | Disposition: A | Discharge status: Home / Self Care | Payer: Medicare Other | Source: Ambulatory Visit | Attending: Cardiovascular Disease | Admitting: Cardiovascular Disease

## 2013-08-26 ENCOUNTER — Encounter (HOSPITAL_COMMUNITY): Payer: Self-pay

## 2013-08-26 ENCOUNTER — Encounter (HOSPITAL_COMMUNITY): Payer: Self-pay | Admitting: Cardiovascular Disease

## 2013-08-26 DIAGNOSIS — I10 Essential (primary) hypertension: Secondary | ICD-10-CM | POA: Diagnosis not present

## 2013-08-26 DIAGNOSIS — Z01810 Encounter for preprocedural cardiovascular examination: Secondary | ICD-10-CM | POA: Diagnosis not present

## 2013-08-26 DIAGNOSIS — I4891 Unspecified atrial fibrillation: Secondary | ICD-10-CM

## 2013-08-26 DIAGNOSIS — Z01818 Encounter for other preprocedural examination: Secondary | ICD-10-CM

## 2013-08-26 MED ORDER — TECHNETIUM TC 99M SESTAMIBI GENERIC - CARDIOLITE
10.0000 | Freq: Once | INTRAVENOUS | Status: AC | PRN
  Administered 2013-08-26: 10 via INTRAVENOUS

## 2013-08-26 MED ORDER — SODIUM CHLORIDE 0.9 % IJ SOLN
INTRAMUSCULAR | Status: AC
  Administered 2013-08-26: 10 mL via INTRAVENOUS
  Filled 2013-08-26: qty 10

## 2013-08-26 MED ORDER — TECHNETIUM TC 99M SESTAMIBI - CARDIOLITE
30.0000 | Freq: Once | INTRAVENOUS | Status: AC | PRN
  Administered 2013-08-26: 30 via INTRAVENOUS

## 2013-08-26 MED ORDER — REGADENOSON 0.4 MG/5ML IV SOLN
INTRAVENOUS | Status: AC
  Administered 2013-08-26: 0.4 mg via INTRAVENOUS
  Filled 2013-08-26: qty 5

## 2013-08-26 NOTE — Progress Notes (Signed)
Stress Lab Nurses Notes - [facility]  [facility] [place] 08/26/2013 Reason for doing test: Surgical Clearance and AFib Type of test: Wille Glaser Nurse performing test: Gerrit Halls, RN Nuclear Medicine Tech: Redmond Baseman Echo Tech: Not Applicable MD performing test: S. McDowell/K.Lawrence NP Family MD: Luan Pulling  Test explained and consent signed: yes IV started: 22g jelco, Saline lock flushed, No redness or edema and Saline lock started in radiology Symptoms: headache Treatment/Intervention: None Reason test stopped: protocol completed After recovery IV was: Discontinued via X-ray tech and No redness or edema Patient to return to [facility]. Med at : 11:25 Patient discharged: Home Patient's Condition upon discharge was: stable Comments: During test B/P112/75 & HR 133.  Recovery BP 104/79 & HR 117.  Symptoms resolved in recovery. Geanie Cooley T

## 2013-09-01 ENCOUNTER — Telehealth: Payer: Self-pay | Admitting: Cardiovascular Disease

## 2013-09-01 DIAGNOSIS — I4891 Unspecified atrial fibrillation: Secondary | ICD-10-CM | POA: Diagnosis not present

## 2013-09-01 DIAGNOSIS — S72009A Fracture of unspecified part of neck of unspecified femur, initial encounter for closed fracture: Secondary | ICD-10-CM | POA: Diagnosis not present

## 2013-09-01 DIAGNOSIS — G459 Transient cerebral ischemic attack, unspecified: Secondary | ICD-10-CM | POA: Diagnosis not present

## 2013-09-01 DIAGNOSIS — I10 Essential (primary) hypertension: Secondary | ICD-10-CM | POA: Diagnosis not present

## 2013-09-01 NOTE — Telephone Encounter (Signed)
Results of myoview / tgs

## 2013-09-01 NOTE — Telephone Encounter (Signed)
Calling for myo view results

## 2013-09-03 NOTE — Telephone Encounter (Signed)
Message copied by Laurine Blazer on Wed Sep 03, 2013  4:46 PM ------      Message from: Kate Sable A      Created: Thu Aug 28, 2013  9:17 AM       Yes. This is the new classification as per the most recent guidelines, as she is not at a low risk given her age and other comorbidities.       Yes, her stress test was normal and thus while she is at an elevated risk, it does not mean that she is at high risk. They have done away with low, intermediate, and high risk categories.                  ----- Message -----         From: Laurine Blazer, LPN         Sent: 7/34/2876   5:40 PM           To: Herminio Commons, MD            Please clarify below, did your really mean "elevated risk" ?      Thanks.       ----- Message -----         From: Herminio Commons, MD         Sent: 08/26/2013   2:37 PM           To: Laurine Blazer, LPN            Normal stress test. As per the 2014 ACC/AHA guidelines, she is most likely at an elevated risk (>/= 1%) for a major adverse cardiac event.                    ------

## 2013-09-03 NOTE — Telephone Encounter (Signed)
Notes Recorded by Laurine Blazer, LPN on 02/09/4162 at 8:45 PM Patient notified and verbalized understanding. Will forward results to Dr. Jean Rosenthal also (orthopaedic surgeon)

## 2013-09-18 ENCOUNTER — Other Ambulatory Visit (HOSPITAL_COMMUNITY): Payer: Self-pay | Admitting: Orthopaedic Surgery

## 2013-09-19 NOTE — Progress Notes (Signed)
Surgery on 10/03/13.  Preop on 09/25/13 at 200pm.  Need orders in EPIC.  Thank You.

## 2013-09-22 ENCOUNTER — Encounter (HOSPITAL_COMMUNITY): Payer: Self-pay | Admitting: Pharmacy Technician

## 2013-09-22 ENCOUNTER — Other Ambulatory Visit (HOSPITAL_COMMUNITY): Payer: Self-pay | Admitting: Orthopaedic Surgery

## 2013-09-22 NOTE — Progress Notes (Signed)
Need orders in EPIC.  Surgery on 10/03/12.  Preop on 09/25/13 at 200pm.  Thank You.

## 2013-09-25 ENCOUNTER — Encounter (HOSPITAL_COMMUNITY): Payer: Self-pay

## 2013-09-25 ENCOUNTER — Ambulatory Visit (HOSPITAL_COMMUNITY)
Admission: RE | Admit: 2013-09-25 | Discharge: 2013-09-25 | Disposition: A | Discharge status: Home / Self Care | Payer: Medicare Other | Source: Ambulatory Visit | Attending: Anesthesiology | Admitting: Anesthesiology

## 2013-09-25 ENCOUNTER — Encounter (HOSPITAL_COMMUNITY)
Admission: RE | Admit: 2013-09-25 | Discharge: 2013-09-25 | Disposition: A | Discharge status: Home / Self Care | Payer: Medicare Other | Source: Ambulatory Visit | Attending: Orthopaedic Surgery | Admitting: Orthopaedic Surgery

## 2013-09-25 DIAGNOSIS — M899 Disorder of bone, unspecified: Secondary | ICD-10-CM | POA: Insufficient documentation

## 2013-09-25 DIAGNOSIS — M439 Deforming dorsopathy, unspecified: Secondary | ICD-10-CM | POA: Insufficient documentation

## 2013-09-25 DIAGNOSIS — Z01812 Encounter for preprocedural laboratory examination: Secondary | ICD-10-CM | POA: Insufficient documentation

## 2013-09-25 DIAGNOSIS — M949 Disorder of cartilage, unspecified: Secondary | ICD-10-CM

## 2013-09-25 DIAGNOSIS — I517 Cardiomegaly: Secondary | ICD-10-CM | POA: Diagnosis not present

## 2013-09-25 DIAGNOSIS — K449 Diaphragmatic hernia without obstruction or gangrene: Secondary | ICD-10-CM | POA: Insufficient documentation

## 2013-09-25 DIAGNOSIS — Z01818 Encounter for other preprocedural examination: Secondary | ICD-10-CM | POA: Diagnosis not present

## 2013-09-25 HISTORY — DX: Unspecified osteoarthritis, unspecified site: M19.90

## 2013-09-25 HISTORY — DX: Personal history of other medical treatment: Z92.89

## 2013-09-25 HISTORY — DX: Cardiac arrhythmia, unspecified: I49.9

## 2013-09-25 HISTORY — DX: Personal history of other diseases of the digestive system: Z87.19

## 2013-09-25 HISTORY — DX: Anemia, unspecified: D64.9

## 2013-09-25 LAB — URINALYSIS, ROUTINE W REFLEX MICROSCOPIC
BILIRUBIN URINE: NEGATIVE
Glucose, UA: NEGATIVE mg/dL
Hgb urine dipstick: NEGATIVE
KETONES UR: NEGATIVE mg/dL
LEUKOCYTES UA: NEGATIVE
NITRITE: NEGATIVE
PH: 6 (ref 5.0–8.0)
Protein, ur: 100 mg/dL — AB
Specific Gravity, Urine: 1.026 (ref 1.005–1.030)
Urobilinogen, UA: 0.2 mg/dL (ref 0.0–1.0)

## 2013-09-25 LAB — CBC
HCT: 45.6 % (ref 36.0–46.0)
Hemoglobin: 14.8 g/dL (ref 12.0–15.0)
MCH: 27.9 pg (ref 26.0–34.0)
MCHC: 32.5 g/dL (ref 30.0–36.0)
MCV: 85.9 fL (ref 78.0–100.0)
PLATELETS: 315 10*3/uL (ref 150–400)
RBC: 5.31 MIL/uL — AB (ref 3.87–5.11)
RDW: 15.6 % — ABNORMAL HIGH (ref 11.5–15.5)
WBC: 10.3 10*3/uL (ref 4.0–10.5)

## 2013-09-25 LAB — URINE MICROSCOPIC-ADD ON

## 2013-09-25 LAB — BASIC METABOLIC PANEL
BUN: 19 mg/dL (ref 6–23)
CALCIUM: 9.9 mg/dL (ref 8.4–10.5)
CO2: 28 mEq/L (ref 19–32)
Chloride: 96 mEq/L (ref 96–112)
Creatinine, Ser: 0.78 mg/dL (ref 0.50–1.10)
GFR calc Af Amer: 84 mL/min — ABNORMAL LOW (ref >90)
GFR calc non Af Amer: 73 mL/min — ABNORMAL LOW (ref >90)
GLUCOSE: 84 mg/dL (ref 70–99)
Potassium: 5.5 mEq/L — ABNORMAL HIGH (ref 3.7–5.3)
SODIUM: 135 meq/L — AB (ref 137–147)

## 2013-09-25 LAB — APTT: aPTT: 45 seconds — ABNORMAL HIGH (ref 24–37)

## 2013-09-25 LAB — ABO/RH: ABO/RH(D): A POS

## 2013-09-25 LAB — PROTIME-INR
INR: 1.14 (ref 0.00–1.49)
Prothrombin Time: 14.4 seconds (ref 11.6–15.2)

## 2013-09-25 LAB — SURGICAL PCR SCREEN
MRSA, PCR: NEGATIVE
STAPHYLOCOCCUS AUREUS: NEGATIVE

## 2013-09-25 NOTE — Progress Notes (Signed)
PST visit-- Instructed patient and daughter in law to contact Judeen Hammans at Dr Adela Ports office today regarding Gretel Acre and to follow up tomorrow if needed to get verification.  Verbalized understanding.  Faxed BMP,u/a with micro to dr Malcolm Metro via [facility]

## 2013-09-25 NOTE — Patient Instructions (Signed)
Your procedure is scheduled on:  10/03/13  FRIDAY  Report to [facility]      AM.  Call this number if you have problems the morning of surgery: [phone]        Do not eat food  Or drink :After Midnight.THURSDAY NIGHT   Take these medicines the morning of surgery with A SIP OF WATER: METOPROLOL, Diltiazem, Levothyroxine                                                              May take Tramadol if needed   .  Contacts, dentures or partial plates, or metal hairpins  can not be worn to surgery. Your family will be responsible for glasses, dentures, hearing aides while you are in surgery  Leave suitcase in the car. After surgery it may be brought to your room.  For patients admitted to the hospital, checkout time is 11:00 AM day of  discharge.                DO NOT WEAR JEWELRY, LOTIONS, POWDERS, OR PERFUMES.  WOMEN-- DO NOT SHAVE LEGS OR UNDERARMS FOR 48 HOURS BEFORE SHOWERS. MEN MAY SHAVE FACE.  Patients discharged the day of surgery will not be allowed to drive home. IF going home the day of surgery, you must have a driver and someone to stay with you for the first 24 hours  Name and phone number of your driver:                                                                                                                                         [facility] - Preparing for Surgery Before surgery, you can play an important role.  Because skin is not sterile, your skin needs to be as free of germs as possible.  You can reduce the number of germs on your skin by washing with CHG (chlorahexidine gluconate) soap before surgery.  CHG is an antiseptic cleaner which kills germs and bonds with the skin to continue killing germs even after washing. Please DO NOT use if you have an allergy to CHG or antibacterial soaps.  If your skin becomes reddened/irritated stop using the CHG and inform your nurse when you arrive at Short Stay. Do not shave (including legs and  underarms) for at least 48 hours prior to the first CHG shower.  You may shave your face. Please follow these instructions carefully:  1.  Shower with CHG Soap the night before surgery and the  morning of Surgery.  2.  If you choose to wash your hair, wash your hair first as usual with your  normal  shampoo.  3.  After you shampoo, rinse your hair and body thoroughly to remove the  shampoo.                           4.  Use CHG as you would any other liquid soap.  You can apply chg directly  to the skin and wash                       Gently with a scrungie or clean washcloth.  5.  Apply the CHG Soap to your body ONLY FROM THE NECK DOWN.   Do not use on open                           Wound or open sores. Avoid contact with eyes, ears mouth and genitals (private parts).                        Genitals (private parts) with your normal soap.             6.  Wash thoroughly, paying special attention to the area where your surgery  will be performed.  7.  Thoroughly rinse your body with warm water from the neck down.  8.  DO NOT shower/wash with your normal soap after using and rinsing off  the CHG Soap.                9.  Pat yourself dry with a clean towel.            10.  Wear clean pajamas.            11.  Place clean sheets on your bed the night of your first shower and do not  sleep with pets. Day of Surgery : Do not apply any lotions/deodorants the morning of surgery.  Please wear clean clothes to the hospital/surgery center.  FAILURE TO FOLLOW THESE INSTRUCTIONS MAY RESULT IN THE CANCELLATION OF YOUR SURGERY PATIENT SIGNATURE_________________________________  NURSE SIGNATURE__________________________________  WHAT IS A BLOOD TRANSFUSION? Blood Transfusion Information  A transfusion is the replacement of blood or some of its parts. Blood is made up of multiple cells which provide different functions.  Red blood cells carry oxygen and are used for blood loss replacement.  White blood  cells fight against infection.  Platelets control bleeding.  Plasma helps clot blood.  Other blood products are available for specialized needs, such as hemophilia or other clotting disorders. BEFORE THE TRANSFUSION  Who gives blood for transfusions?   Healthy volunteers who are fully evaluated to make sure their blood is safe. This is blood bank blood. Transfusion therapy is the safest it has ever been in the practice of medicine. Before blood is taken from a donor, a complete history is taken to make sure that person has no history of diseases nor engages in risky social behavior (examples are intravenous drug use or sexual activity with multiple partners). The donor's travel history is screened to minimize risk of transmitting infections, such as malaria. The donated blood is tested for signs of infectious diseases, such as HIV and hepatitis. The blood is then tested to be sure it is compatible with you in order to minimize the chance of a transfusion reaction. If you or a relative donates blood, this is often done in anticipation of surgery and is not appropriate for emergency  situations. It takes many days to process the donated blood. RISKS AND COMPLICATIONS Although transfusion therapy is very safe and saves many lives, the main dangers of transfusion include:   Getting an infectious disease.  Developing a transfusion reaction. This is an allergic reaction to something in the blood you were given. Every precaution is taken to prevent this. The decision to have a blood transfusion has been considered carefully by your caregiver before blood is given. Blood is not given unless the benefits outweigh the risks. AFTER THE TRANSFUSION  Right after receiving a blood transfusion, you will usually feel much better and more energetic. This is especially true if your red blood cells have gotten low (anemic). The transfusion raises the level of the red blood cells which carry oxygen, and this usually  causes an energy increase.  The nurse administering the transfusion will monitor you carefully for complications. HOME CARE INSTRUCTIONS  No special instructions are needed after a transfusion. You may find your energy is better. Speak with your caregiver about any limitations on activity for underlying diseases you may have. SEEK MEDICAL CARE IF:   Your condition is not improving after your transfusion.  You develop redness or irritation at the intravenous (IV) site. SEEK IMMEDIATE MEDICAL CARE IF:  Any of the following symptoms occur over the next 12 hours:  Shaking chills.  You have a temperature by mouth above 102 F (38.9 C), not controlled by medicine.  Chest, back, or muscle pain.  People around you feel you are not acting correctly or are confused.  Shortness of breath or difficulty breathing.  Dizziness and fainting.  You get a rash or develop hives.  You have a decrease in urine output.  Your urine turns a dark color or changes to pink, red, or brown. Any of the following symptoms occur over the next 10 days:  You have a temperature by mouth above 102 F (38.9 C), not controlled by medicine.  Shortness of breath.  Weakness after normal activity.  The white part of the eye turns yellow (jaundice).  You have a decrease in the amount of urine or are urinating less often.  Your urine turns a dark color or changes to pink, red, or brown. Document Released: 05/19/2000 Document Revised: 08/14/2011 Document Reviewed: 01/06/2008 [facility] Patient Information 2014 [place].

## 2013-09-25 NOTE — Progress Notes (Signed)
EKG 11/01/12, eccho 4/14, stress test 3/15, LOV Dr Bronson Ing for pre op clearance EPIC

## 2013-09-26 ENCOUNTER — Encounter (HOSPITAL_COMMUNITY): Payer: Self-pay

## 2013-09-29 ENCOUNTER — Other Ambulatory Visit (HOSPITAL_COMMUNITY): Payer: Medicare Other

## 2013-10-03 ENCOUNTER — Inpatient Hospital Stay (HOSPITAL_COMMUNITY)
Admission: RE | Admit: 2013-10-03 | Discharge: 2013-10-07 | DRG: 469 | Disposition: A | Discharge status: Skilled Nursing Facility | Payer: Medicare Other | Source: Ambulatory Visit | Attending: Orthopaedic Surgery | Admitting: Orthopaedic Surgery

## 2013-10-03 ENCOUNTER — Inpatient Hospital Stay (HOSPITAL_COMMUNITY): Payer: Medicare Other | Admitting: Registered Nurse

## 2013-10-03 ENCOUNTER — Inpatient Hospital Stay (HOSPITAL_COMMUNITY): Payer: Medicare Other

## 2013-10-03 ENCOUNTER — Encounter (HOSPITAL_COMMUNITY): Payer: Self-pay | Admitting: *Deleted

## 2013-10-03 ENCOUNTER — Encounter (HOSPITAL_COMMUNITY)
Admission: RE | Disposition: A | Discharge status: Skilled Nursing Facility | Payer: Self-pay | Source: Ambulatory Visit | Attending: Orthopaedic Surgery

## 2013-10-03 ENCOUNTER — Encounter (HOSPITAL_COMMUNITY): Payer: Medicare Other | Admitting: Registered Nurse

## 2013-10-03 DIAGNOSIS — M169 Osteoarthritis of hip, unspecified: Secondary | ICD-10-CM | POA: Diagnosis present

## 2013-10-03 DIAGNOSIS — E039 Hypothyroidism, unspecified: Secondary | ICD-10-CM | POA: Diagnosis present

## 2013-10-03 DIAGNOSIS — Z472 Encounter for removal of internal fixation device: Secondary | ICD-10-CM | POA: Diagnosis not present

## 2013-10-03 DIAGNOSIS — I6990 Unspecified sequelae of unspecified cerebrovascular disease: Secondary | ICD-10-CM | POA: Diagnosis not present

## 2013-10-03 DIAGNOSIS — T84099A Other mechanical complication of unspecified internal joint prosthesis, initial encounter: Secondary | ICD-10-CM | POA: Diagnosis not present

## 2013-10-03 DIAGNOSIS — K449 Diaphragmatic hernia without obstruction or gangrene: Secondary | ICD-10-CM | POA: Diagnosis present

## 2013-10-03 DIAGNOSIS — X58XXXA Exposure to other specified factors, initial encounter: Secondary | ICD-10-CM | POA: Diagnosis present

## 2013-10-03 DIAGNOSIS — S73006A Unspecified dislocation of unspecified hip, initial encounter: Secondary | ICD-10-CM | POA: Diagnosis not present

## 2013-10-03 DIAGNOSIS — Z8249 Family history of ischemic heart disease and other diseases of the circulatory system: Secondary | ICD-10-CM

## 2013-10-03 DIAGNOSIS — M8708 Idiopathic aseptic necrosis of bone, other site: Secondary | ICD-10-CM | POA: Diagnosis not present

## 2013-10-03 DIAGNOSIS — M8700 Idiopathic aseptic necrosis of unspecified bone: Secondary | ICD-10-CM | POA: Diagnosis not present

## 2013-10-03 DIAGNOSIS — D62 Acute posthemorrhagic anemia: Secondary | ICD-10-CM | POA: Diagnosis not present

## 2013-10-03 DIAGNOSIS — S72009A Fracture of unspecified part of neck of unspecified femur, initial encounter for closed fracture: Secondary | ICD-10-CM | POA: Diagnosis not present

## 2013-10-03 DIAGNOSIS — Z8673 Personal history of transient ischemic attack (TIA), and cerebral infarction without residual deficits: Secondary | ICD-10-CM

## 2013-10-03 DIAGNOSIS — M87052 Idiopathic aseptic necrosis of left femur: Secondary | ICD-10-CM

## 2013-10-03 DIAGNOSIS — M87059 Idiopathic aseptic necrosis of unspecified femur: Secondary | ICD-10-CM | POA: Diagnosis not present

## 2013-10-03 DIAGNOSIS — M1610 Unilateral primary osteoarthritis, unspecified hip: Secondary | ICD-10-CM | POA: Diagnosis present

## 2013-10-03 DIAGNOSIS — M6281 Muscle weakness (generalized): Secondary | ICD-10-CM | POA: Diagnosis not present

## 2013-10-03 DIAGNOSIS — I4891 Unspecified atrial fibrillation: Secondary | ICD-10-CM | POA: Diagnosis not present

## 2013-10-03 DIAGNOSIS — I10 Essential (primary) hypertension: Secondary | ICD-10-CM | POA: Diagnosis not present

## 2013-10-03 DIAGNOSIS — R5381 Other malaise: Secondary | ICD-10-CM | POA: Diagnosis not present

## 2013-10-03 DIAGNOSIS — M76899 Other specified enthesopathies of unspecified lower limb, excluding foot: Secondary | ICD-10-CM | POA: Diagnosis not present

## 2013-10-03 DIAGNOSIS — Z96649 Presence of unspecified artificial hip joint: Secondary | ICD-10-CM

## 2013-10-03 DIAGNOSIS — R269 Unspecified abnormalities of gait and mobility: Secondary | ICD-10-CM | POA: Diagnosis not present

## 2013-10-03 DIAGNOSIS — T84498A Other mechanical complication of other internal orthopedic devices, implants and grafts, initial encounter: Secondary | ICD-10-CM | POA: Diagnosis present

## 2013-10-03 DIAGNOSIS — R5383 Other fatigue: Secondary | ICD-10-CM | POA: Diagnosis not present

## 2013-10-03 DIAGNOSIS — M25559 Pain in unspecified hip: Secondary | ICD-10-CM | POA: Diagnosis not present

## 2013-10-03 DIAGNOSIS — Y831 Surgical operation with implant of artificial internal device as the cause of abnormal reaction of the patient, or of later complication, without mention of misadventure at the time of the procedure: Secondary | ICD-10-CM | POA: Diagnosis present

## 2013-10-03 DIAGNOSIS — Z471 Aftercare following joint replacement surgery: Secondary | ICD-10-CM | POA: Diagnosis not present

## 2013-10-03 DIAGNOSIS — M1990 Unspecified osteoarthritis, unspecified site: Secondary | ICD-10-CM | POA: Diagnosis not present

## 2013-10-03 DIAGNOSIS — M62838 Other muscle spasm: Secondary | ICD-10-CM | POA: Diagnosis not present

## 2013-10-03 HISTORY — PX: TOTAL HIP ARTHROPLASTY: SHX124

## 2013-10-03 SURGERY — ARTHROPLASTY, HIP, TOTAL, ANTERIOR APPROACH
Anesthesia: Spinal | Site: Hip | Laterality: Left

## 2013-10-03 MED ORDER — PROPOFOL INFUSION 10 MG/ML OPTIME
INTRAVENOUS | Status: DC | PRN
  Administered 2013-10-03: 25 ug/kg/min via INTRAVENOUS

## 2013-10-03 MED ORDER — CEFAZOLIN SODIUM-DEXTROSE 2-3 GM-% IV SOLR
2.0000 g | INTRAVENOUS | Status: AC
  Administered 2013-10-03: 2 g via INTRAVENOUS

## 2013-10-03 MED ORDER — METHOCARBAMOL 1000 MG/10ML IJ SOLN
500.0000 mg | Freq: Four times a day (QID) | INTRAVENOUS | Status: DC | PRN
  Administered 2013-10-03: 500 mg via INTRAVENOUS
  Filled 2013-10-03: qty 5

## 2013-10-03 MED ORDER — PROPOFOL 10 MG/ML IV BOLUS
INTRAVENOUS | Status: AC
  Filled 2013-10-03: qty 20

## 2013-10-03 MED ORDER — LACTATED RINGERS IV SOLN
INTRAVENOUS | Status: DC
  Administered 2013-10-03: 11:00:00 via INTRAVENOUS
  Administered 2013-10-03: 1000 mL via INTRAVENOUS

## 2013-10-03 MED ORDER — MENTHOL 3 MG MT LOZG
1.0000 | LOZENGE | OROMUCOSAL | Status: DC | PRN
  Filled 2013-10-03: qty 9

## 2013-10-03 MED ORDER — ACETAMINOPHEN 10 MG/ML IV SOLN
1000.0000 mg | Freq: Once | INTRAVENOUS | Status: AC
  Administered 2013-10-03: 1000 mg via INTRAVENOUS
  Filled 2013-10-03: qty 100

## 2013-10-03 MED ORDER — PROPOFOL 10 MG/ML IV BOLUS
INTRAVENOUS | Status: DC | PRN
  Administered 2013-10-03: 30 mg via INTRAVENOUS

## 2013-10-03 MED ORDER — SODIUM CHLORIDE 0.9 % IV SOLN
INTRAVENOUS | Status: DC
  Administered 2013-10-03: 15:00:00 via INTRAVENOUS
  Administered 2013-10-04: 75 mL/h via INTRAVENOUS
  Administered 2013-10-04: 05:00:00 via INTRAVENOUS

## 2013-10-03 MED ORDER — ONDANSETRON HCL 4 MG/2ML IJ SOLN
4.0000 mg | Freq: Four times a day (QID) | INTRAMUSCULAR | Status: DC | PRN
  Administered 2013-10-05: 4 mg via INTRAVENOUS
  Filled 2013-10-03: qty 2

## 2013-10-03 MED ORDER — DILTIAZEM HCL ER COATED BEADS 180 MG PO CP24
180.0000 mg | ORAL_CAPSULE | Freq: Every morning | ORAL | Status: DC
  Administered 2013-10-04 – 2013-10-06 (×3): 180 mg via ORAL
  Filled 2013-10-03 (×4): qty 1

## 2013-10-03 MED ORDER — PHENYLEPHRINE HCL 10 MG/ML IJ SOLN
INTRAMUSCULAR | Status: DC | PRN
  Administered 2013-10-03: 80 ug via INTRAVENOUS
  Administered 2013-10-03: 120 ug via INTRAVENOUS

## 2013-10-03 MED ORDER — METOCLOPRAMIDE HCL 10 MG PO TABS
5.0000 mg | ORAL_TABLET | Freq: Three times a day (TID) | ORAL | Status: DC | PRN
  Administered 2013-10-05: 10 mg via ORAL
  Filled 2013-10-03: qty 1

## 2013-10-03 MED ORDER — GLYCOPYRROLATE 0.2 MG/ML IJ SOLN
INTRAMUSCULAR | Status: DC | PRN
  Administered 2013-10-03: 0.2 mg via INTRAVENOUS

## 2013-10-03 MED ORDER — CEFAZOLIN SODIUM 1-5 GM-% IV SOLN
1.0000 g | Freq: Four times a day (QID) | INTRAVENOUS | Status: AC
  Administered 2013-10-03 (×2): 1 g via INTRAVENOUS
  Filled 2013-10-03 (×2): qty 50

## 2013-10-03 MED ORDER — SODIUM CHLORIDE 0.9 % IR SOLN
Status: DC | PRN
  Administered 2013-10-03: 1000 mL

## 2013-10-03 MED ORDER — PHENYLEPHRINE HCL 10 MG/ML IJ SOLN
10.0000 mg | INTRAVENOUS | Status: DC | PRN
  Administered 2013-10-03: 10 mg
  Administered 2013-10-03: 10 ug/min via INTRAVENOUS

## 2013-10-03 MED ORDER — ACETAMINOPHEN 325 MG PO TABS
650.0000 mg | ORAL_TABLET | Freq: Four times a day (QID) | ORAL | Status: DC | PRN
  Administered 2013-10-05 – 2013-10-06 (×3): 650 mg via ORAL
  Filled 2013-10-03 (×3): qty 2

## 2013-10-03 MED ORDER — FENTANYL CITRATE 0.05 MG/ML IJ SOLN
INTRAMUSCULAR | Status: DC | PRN
  Administered 2013-10-03 (×2): 50 ug via INTRAVENOUS

## 2013-10-03 MED ORDER — HYDROMORPHONE HCL PF 1 MG/ML IJ SOLN
0.5000 mg | INTRAMUSCULAR | Status: DC | PRN
  Administered 2013-10-03: 0.5 mg via INTRAVENOUS
  Filled 2013-10-03 (×2): qty 1

## 2013-10-03 MED ORDER — SODIUM CHLORIDE 0.9 % IJ SOLN
INTRAMUSCULAR | Status: AC
  Filled 2013-10-03: qty 10

## 2013-10-03 MED ORDER — PHENOL 1.4 % MT LIQD
1.0000 | OROMUCOSAL | Status: DC | PRN
  Filled 2013-10-03: qty 177

## 2013-10-03 MED ORDER — DOCUSATE SODIUM 100 MG PO CAPS
100.0000 mg | ORAL_CAPSULE | Freq: Two times a day (BID) | ORAL | Status: DC
  Administered 2013-10-03 – 2013-10-07 (×8): 100 mg via ORAL
  Filled 2013-10-03 (×9): qty 1

## 2013-10-03 MED ORDER — HYDROCODONE-ACETAMINOPHEN 5-325 MG PO TABS
1.0000 | ORAL_TABLET | ORAL | Status: DC | PRN
  Administered 2013-10-03 – 2013-10-04 (×6): 1 via ORAL
  Filled 2013-10-03 (×2): qty 1
  Filled 2013-10-03: qty 2
  Filled 2013-10-03 (×3): qty 1

## 2013-10-03 MED ORDER — METOPROLOL TARTRATE 25 MG PO TABS
25.0000 mg | ORAL_TABLET | Freq: Two times a day (BID) | ORAL | Status: DC
  Administered 2013-10-03 – 2013-10-06 (×7): 25 mg via ORAL
  Filled 2013-10-03 (×9): qty 1

## 2013-10-03 MED ORDER — ACETAMINOPHEN 650 MG RE SUPP: 650.0000 mg | Freq: Four times a day (QID) | RECTAL | Status: DC | PRN

## 2013-10-03 MED ORDER — EPHEDRINE SULFATE 50 MG/ML IJ SOLN
INTRAMUSCULAR | Status: DC | PRN
  Administered 2013-10-03: 5 mg via INTRAVENOUS

## 2013-10-03 MED ORDER — EPHEDRINE SULFATE 50 MG/ML IJ SOLN
INTRAMUSCULAR | Status: AC
  Filled 2013-10-03: qty 1

## 2013-10-03 MED ORDER — ONDANSETRON HCL 4 MG/2ML IJ SOLN
INTRAMUSCULAR | Status: AC
  Filled 2013-10-03: qty 2

## 2013-10-03 MED ORDER — ONDANSETRON HCL 4 MG PO TABS: 4.0000 mg | ORAL_TABLET | Freq: Four times a day (QID) | ORAL | Status: DC | PRN

## 2013-10-03 MED ORDER — TRAMADOL HCL 50 MG PO TABS
100.0000 mg | ORAL_TABLET | Freq: Four times a day (QID) | ORAL | Status: DC | PRN
  Administered 2013-10-04 – 2013-10-07 (×4): 100 mg via ORAL
  Filled 2013-10-03 (×4): qty 2

## 2013-10-03 MED ORDER — APIXABAN 2.5 MG PO TABS
2.5000 mg | ORAL_TABLET | Freq: Two times a day (BID) | ORAL | Status: DC
  Administered 2013-10-03 – 2013-10-07 (×8): 2.5 mg via ORAL
  Filled 2013-10-03 (×9): qty 1

## 2013-10-03 MED ORDER — BUPIVACAINE HCL (PF) 0.5 % IJ SOLN
INTRAMUSCULAR | Status: AC
  Filled 2013-10-03: qty 30

## 2013-10-03 MED ORDER — ALUM & MAG HYDROXIDE-SIMETH 200-200-20 MG/5ML PO SUSP: 30.0000 mL | ORAL | Status: DC | PRN

## 2013-10-03 MED ORDER — FENTANYL CITRATE 0.05 MG/ML IJ SOLN
INTRAMUSCULAR | Status: AC
  Filled 2013-10-03: qty 2

## 2013-10-03 MED ORDER — DIPHENHYDRAMINE HCL 12.5 MG/5ML PO ELIX: 12.5000 mg | ORAL_SOLUTION | ORAL | Status: DC | PRN

## 2013-10-03 MED ORDER — METOCLOPRAMIDE HCL 5 MG/ML IJ SOLN: 5.0000 mg | Freq: Three times a day (TID) | INTRAMUSCULAR | Status: DC | PRN

## 2013-10-03 MED ORDER — METHOCARBAMOL 500 MG PO TABS
500.0000 mg | ORAL_TABLET | Freq: Four times a day (QID) | ORAL | Status: DC | PRN
  Administered 2013-10-04: 500 mg via ORAL
  Filled 2013-10-03: qty 1

## 2013-10-03 MED ORDER — MIDAZOLAM HCL 2 MG/2ML IJ SOLN
INTRAMUSCULAR | Status: AC
  Filled 2013-10-03: qty 2

## 2013-10-03 MED ORDER — MEPERIDINE HCL 50 MG/ML IJ SOLN: 6.2500 mg | INTRAMUSCULAR | Status: DC | PRN

## 2013-10-03 MED ORDER — CEFAZOLIN SODIUM-DEXTROSE 2-3 GM-% IV SOLR
INTRAVENOUS | Status: AC
  Filled 2013-10-03: qty 50

## 2013-10-03 MED ORDER — FENTANYL CITRATE 0.05 MG/ML IJ SOLN: 25.0000 ug | INTRAMUSCULAR | Status: DC | PRN

## 2013-10-03 MED ORDER — BUPIVACAINE HCL (PF) 0.5 % IJ SOLN
INTRAMUSCULAR | Status: DC | PRN
  Administered 2013-10-03: 2.6 mL

## 2013-10-03 MED ORDER — PROMETHAZINE HCL 25 MG/ML IJ SOLN: 6.2500 mg | INTRAMUSCULAR | Status: DC | PRN

## 2013-10-03 MED ORDER — LEVOTHYROXINE SODIUM 100 MCG PO TABS
100.0000 ug | ORAL_TABLET | Freq: Every day | ORAL | Status: DC
  Administered 2013-10-04 – 2013-10-07 (×4): 100 ug via ORAL
  Filled 2013-10-03 (×5): qty 1

## 2013-10-03 MED ORDER — 0.9 % SODIUM CHLORIDE (POUR BTL) OPTIME
TOPICAL | Status: DC | PRN
  Administered 2013-10-03: 1000 mL

## 2013-10-03 SURGICAL SUPPLY — 43 items
BAG ZIPLOCK 12X15 (MISCELLANEOUS) IMPLANT
BLADE SAW SGTL 18X1.27X75 (BLADE) ×3 IMPLANT
BLADE SAW SGTL 18X1.27X75MM (BLADE) ×1
CAPT HIP PF MOP ×4 IMPLANT
CELLS DAT CNTRL 66122 CELL SVR (MISCELLANEOUS) ×2 IMPLANT
DRAPE C-ARM 42X120 X-RAY (DRAPES) ×4 IMPLANT
DRAPE LG THREE QUARTER DISP (DRAPES) ×4 IMPLANT
DRAPE STERI IOBAN 125X83 (DRAPES) ×4 IMPLANT
DRAPE U-SHAPE 47X51 STRL (DRAPES) ×12 IMPLANT
DRSG AQUACEL AG ADV 3.5X 6 (GAUZE/BANDAGES/DRESSINGS) IMPLANT
DRSG AQUACEL AG ADV 3.5X10 (GAUZE/BANDAGES/DRESSINGS) ×4 IMPLANT
DURAPREP 26ML APPLICATOR (WOUND CARE) ×4 IMPLANT
ELECT BLADE TIP CTD 4 INCH (ELECTRODE) ×4 IMPLANT
ELECT REM PT RETURN 9FT ADLT (ELECTROSURGICAL) ×4
ELECTRODE REM PT RTRN 9FT ADLT (ELECTROSURGICAL) ×2 IMPLANT
FACESHIELD WRAPAROUND (MASK) ×12 IMPLANT
GAUZE XEROFORM 1X8 LF (GAUZE/BANDAGES/DRESSINGS) ×4 IMPLANT
GLOVE BIO SURGEON STRL SZ7.5 (GLOVE) ×4 IMPLANT
GLOVE BIOGEL PI IND STRL 8 (GLOVE) ×4 IMPLANT
GLOVE BIOGEL PI INDICATOR 8 (GLOVE) ×4
GLOVE ECLIPSE 8.0 STRL XLNG CF (GLOVE) ×4 IMPLANT
GOWN STRL REUS W/TWL XL LVL3 (GOWN DISPOSABLE) ×8 IMPLANT
HANDPIECE INTERPULSE COAX TIP (DISPOSABLE) ×2
KIT BASIN OR (CUSTOM PROCEDURE TRAY) ×4 IMPLANT
NS IRRIG 1000ML POUR BTL (IV SOLUTION) ×4 IMPLANT
PACK TOTAL JOINT (CUSTOM PROCEDURE TRAY) ×4 IMPLANT
PADDING CAST COTTON 6X4 STRL (CAST SUPPLIES) ×4 IMPLANT
POSITIONER SURGICAL ARM (MISCELLANEOUS) ×4 IMPLANT
RTRCTR WOUND ALEXIS 18CM MED (MISCELLANEOUS) ×4
SCREW PINN CAN 6.5X20 (Screw) ×4 IMPLANT
SET HNDPC FAN SPRY TIP SCT (DISPOSABLE) ×2 IMPLANT
STAPLER VISISTAT 35W (STAPLE) ×4 IMPLANT
SUT ETHIBOND NAB CT1 #1 30IN (SUTURE) ×4 IMPLANT
SUT ETHILON 3 0 PS 1 (SUTURE) ×4 IMPLANT
SUT VIC AB 0 CT1 36 (SUTURE) ×8 IMPLANT
SUT VIC AB 1 CT1 27 (SUTURE) ×4
SUT VIC AB 1 CT1 27XBRD ANTBC (SUTURE) ×4 IMPLANT
SUT VIC AB 2-0 CT1 27 (SUTURE) ×4
SUT VIC AB 2-0 CT1 TAPERPNT 27 (SUTURE) ×4 IMPLANT
TOWEL OR 17X26 10 PK STRL BLUE (TOWEL DISPOSABLE) ×4 IMPLANT
TOWEL OR NON WOVEN STRL DISP B (DISPOSABLE) ×4 IMPLANT
TRAY FOLEY CATH 14FRSI W/METER (CATHETERS) ×4 IMPLANT
WATER STERILE IRR 1500ML POUR (IV SOLUTION) ×4 IMPLANT

## 2013-10-03 NOTE — Anesthesia Procedure Notes (Signed)
Spinal  Start time: 10/03/2013 10:17 AM End time: 10/03/2013 10:22 AM Staffing CRNA/Resident: Hartleigh Edmonston E Preanesthetic Checklist Completed: patient identified, site marked, surgical consent, pre-op evaluation, timeout performed, IV checked, risks and benefits discussed and monitors and equipment checked Spinal Block Patient position: sitting Prep: Betadine Patient monitoring: continuous pulse ox and heart rate Approach: right paramedian Location: L3-4 Injection technique: single-shot Needle Needle gauge: 22 G Assessment Sensory level: T6 Additional Notes Pt tolerated procedure well. Good csf x 3 negative heme and paresthesia. Spinal kit  Lot [id number], expiration 05/2014

## 2013-10-03 NOTE — Brief Op Note (Signed)
10/03/2013  12:16 PM  PATIENT:  Sabena A Nunnery  80 y.o. 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  PROCEDURE:  Procedure(s): LEFT TOTAL HIP ARTHROPLASTY ANTERIOR APPROACH and REMOVAL OF SCREWS LEFT HIP (Left)  SURGEON:  Surgeon(s) and Role:    * Mcarthur Rossetti, MD - Primary  PHYSICIAN ASSISTANT: Benita Stabile, PA-c  ANESTHESIA:   spinal  EBL:  Total I/O In: 1000 [I.V.:1000] Out: 550 [Urine:300; Blood:250]  BLOOD ADMINISTERED:none  DRAINS: none   LOCAL MEDICATIONS USED:  NONE  SPECIMEN:  No Specimen  DISPOSITION OF SPECIMEN:  N/A  COUNTS:  YES  TOURNIQUET:  * No tourniquets in log *  DICTATION: .Other Dictation: Dictation Number [id number]  PLAN OF CARE: Admit to inpatient   PATIENT DISPOSITION:  PACU - hemodynamically stable.   Delay start of Pharmacological VTE agent (>24hrs) due to surgical blood loss or risk of bleeding: no

## 2013-10-03 NOTE — Anesthesia Preprocedure Evaluation (Signed)
Anesthesia Evaluation  Patient identified by MRN, date of birth, ID band Patient awake    Reviewed: Allergy & Precautions, H&P , NPO status , Patient's Chart, lab work & pertinent test results  Airway Mallampati: II TM Distance: >3 FB Neck ROM: Full    Dental no notable dental hx. (+) Partial Upper, Partial Lower   Pulmonary neg pulmonary ROS,  breath sounds clear to auscultation  Pulmonary exam normal       Cardiovascular hypertension, Pt. on medications + dysrhythmias Atrial Fibrillation Rhythm:Regular Rate:Normal     Neuro/Psych CVA, Residual Symptoms negative psych ROS   GI/Hepatic Neg liver ROS, hiatal hernia,   Endo/Other  Hypothyroidism   Renal/GU negative Renal ROS  negative genitourinary   Musculoskeletal negative musculoskeletal ROS (+)   Abdominal   Peds negative pediatric ROS (+)  Hematology negative hematology ROS (+)   Anesthesia Other Findings   Reproductive/Obstetrics negative OB ROS                           Anesthesia Physical Anesthesia Plan  ASA: III  Anesthesia Plan: Spinal   Post-op Pain Management:    Induction:   Airway Management Planned: Simple Face Mask  Additional Equipment:   Intra-op Plan:   Post-operative Plan:   Informed Consent: I have reviewed the patients History and Physical, chart, labs and discussed the procedure including the risks, benefits and alternatives for the proposed anesthesia with the patient or authorized representative who has indicated his/her understanding and acceptance.   Dental advisory given  Plan Discussed with: CRNA  Anesthesia Plan Comments:         Anesthesia Quick Evaluation

## 2013-10-03 NOTE — Progress Notes (Signed)
Utilization review completed.  

## 2013-10-03 NOTE — H&P (Signed)
TOTAL HIP ADMISSION H&P  Patient is admitted for left total hip arthroplasty.  Subjective:  Chief Complaint: left hip pain  HPI: Regina [name], 80 y.o. female, has a history of pain and functional disability in the left hip(s) due to trauma and patient has failed non-surgical conservative treatments for greater than 12 weeks to include NSAID's and/or analgesics, supervised PT with diminished ADL's post treatment, use of assistive devices and activity modification.  Onset of symptoms was abrupt starting 1 years ago with rapidlly worsening course since that time.The patient noted prior procedures of the hip to include cannulated screws to fix a femoral neck fracture on the left hip(s).  Patient currently rates pain in the left hip at 10 out of 10 with activity. Patient has worsening of pain with activity and weight bearing, pain that interfers with activities of daily living, pain with passive range of motion and crepitus. Patient has evidence of subchondral cysts, subchondral sclerosis and retained cannulated screws with collapse of her femoral head. by imaging studies. This condition presents safety issues increasing the risk of falls. This patient has had failure of previous hip fixation.  There is no current active infection.  Patient Active Problem List   Diagnosis Date Noted  . Avascular necrosis of bone of left hip 10/03/2013  . Atrial fibrillation 09/14/2012  . Hypokalemia 09/14/2012  . Fracture of femoral neck, left 09/13/2012  . Essential hypertension, benign 09/13/2012  . Unspecified hypothyroidism 09/13/2012   Past Medical History  Diagnosis Date  . Hypertension   . Hypothyroidism   . Stroke 2003  . Arthritis   . History of blood transfusion   . Anemia   . Dysrhythmia 4/14    atrial fib post op  . H/O hiatal hernia     Past Surgical History  Procedure Laterality Date  . Eye surgery    . Mouth sugery      Upper teeth impacted  . Hip pinning,cannulated Left 09/13/2012    Procedure: ASNIS HIP PINNING LEFT;  Surgeon: Sanjuana Kava, MD;  Location: [facility];  Service: Orthopedics;  Laterality: Left;    No prescriptions prior to admission   No Known Allergies  History  Substance Use Topics  . Smoking status: Never Smoker   . Smokeless tobacco: Never Used  . Alcohol Use: No     Comment: None since Christmas 2013    Family History  Problem Relation Age of Onset  . Heart attack Mother   . Heart attack Father   . Cancer Brother      Review of Systems  All other systems reviewed and are negative.   Objective:  Physical Exam  Constitutional: She is oriented to person, place, and time. She appears well-developed and well-nourished.  HENT:  Head: Normocephalic and atraumatic.  Eyes: EOM are normal. Pupils are equal, round, and reactive to light.  Neck: Normal range of motion.  Cardiovascular: Normal rate and regular rhythm.   Respiratory: Effort normal and breath sounds normal.  GI: Soft. Bowel sounds are normal.  Musculoskeletal:       Left hip: She exhibits decreased range of motion, decreased strength, bony tenderness, crepitus and deformity.  Neurological: She is alert and oriented to person, place, and time.  Skin: Skin is warm and dry.  Psychiatric: She has a normal mood and affect.    Vital signs in last 24 hours:    Labs:   Estimated body mass index is 19.80 kg/(m^2) as calculated from the following:   Height as  of 08/19/13: 5\' 5"  (1.651 m).   Weight as of 08/19/13: 53.978 kg (119 lb).   Imaging Review Plain radiographs demonstrate severe degenerative joint disease of the left hip(s). The bone quality appears to be good for age and reported activity level.  There is retained cannulated screws with failure of fixation and obvious collapse of the femoral head and shortening.  Assessment/Plan:  End stage arthritis, left hip(s)  The patient history, physical examination, clinical judgement of the provider and imaging studies are  consistent with end stage degenerative joint disease of the left hip(s) and total hip arthroplasty is deemed medically necessary. The treatment options including medical management, injection therapy, arthroscopy and arthroplasty were discussed at length. The risks and benefits of total hip arthroplasty were presented and reviewed. The risks due to aseptic loosening, infection, stiffness, dislocation/subluxation,  thromboembolic complications and other imponderables were discussed.  The patient acknowledged the explanation, agreed to proceed with the plan and consent was signed. Patient is being admitted for inpatient treatment for surgery, pain control, PT, OT, prophylactic antibiotics, VTE prophylaxis, progressive ambulation and ADL's and discharge planning.The patient is planning to be discharged to skilled nursing facility

## 2013-10-03 NOTE — Addendum Note (Signed)
Addendum created 10/03/13 1503 by Lissa Morales, CRNA   Modules edited: Anesthesia Flowsheet, Anesthesia Medication Administration

## 2013-10-03 NOTE — Anesthesia Postprocedure Evaluation (Signed)
  Anesthesia Post-op Note  Patient: Regina [name]  Procedure(s) Performed: Procedure(s) (LRB): LEFT TOTAL HIP ARTHROPLASTY ANTERIOR APPROACH and REMOVAL OF SCREWS LEFT HIP (Left)  Patient Location: PACU  Anesthesia Type: Spinal  Level of Consciousness: awake and alert   Airway and Oxygen Therapy: Patient Spontanous Breathing  Post-op Pain: mild  Post-op Assessment: Post-op Vital signs reviewed, Patient's Cardiovascular Status Stable, Respiratory Function Stable, Patent Airway and No signs of Nausea or vomiting  Last Vitals:  Filed Vitals:   10/03/13 1400  BP: 107/75  Pulse: 47  Temp:   Resp: 22    Post-op Vital Signs: stable   Complications: No apparent anesthesia complications

## 2013-10-03 NOTE — Transfer of Care (Signed)
Immediate Anesthesia Transfer of Care Note  Patient: Regina [name]  Procedure(s) Performed: Procedure(s): LEFT TOTAL HIP ARTHROPLASTY ANTERIOR APPROACH and REMOVAL OF SCREWS LEFT HIP (Left)  Patient Location: PACU  Anesthesia Type:Spinal  Level of Consciousness: awake, alert , oriented and patient cooperative  Airway & Oxygen Therapy: Patient Spontanous Breathing and Patient connected to face mask oxygen  Post-op Assessment: Report given to PACU RN and Post -op Vital signs reviewed and stable  Post vital signs: stable  Complications: No apparent anesthesia complications

## 2013-10-04 LAB — BASIC METABOLIC PANEL
BUN: 12 mg/dL (ref 6–23)
CO2: 28 mEq/L (ref 19–32)
CREATININE: 0.7 mg/dL (ref 0.50–1.10)
Calcium: 8.4 mg/dL (ref 8.4–10.5)
Chloride: 98 mEq/L (ref 96–112)
GFR, EST AFRICAN AMERICAN: 87 mL/min — AB (ref >90)
GFR, EST NON AFRICAN AMERICAN: 75 mL/min — AB (ref >90)
Glucose, Bld: 134 mg/dL — ABNORMAL HIGH (ref 70–99)
Potassium: 4.6 mEq/L (ref 3.7–5.3)
Sodium: 134 mEq/L — ABNORMAL LOW (ref 137–147)

## 2013-10-04 LAB — CBC
HEMATOCRIT: 34.6 % — AB (ref 36.0–46.0)
Hemoglobin: 11.2 g/dL — ABNORMAL LOW (ref 12.0–15.0)
MCH: 28.2 pg (ref 26.0–34.0)
MCHC: 32.4 g/dL (ref 30.0–36.0)
MCV: 87.2 fL (ref 78.0–100.0)
PLATELETS: 174 10*3/uL (ref 150–400)
RBC: 3.97 MIL/uL (ref 3.87–5.11)
RDW: 16.1 % — ABNORMAL HIGH (ref 11.5–15.5)
WBC: 8.2 10*3/uL (ref 4.0–10.5)

## 2013-10-04 NOTE — Progress Notes (Signed)
Subjective: 1 Day Post-Op Procedure(s) (LRB): LEFT TOTAL HIP ARTHROPLASTY ANTERIOR APPROACH and REMOVAL OF SCREWS LEFT HIP (Left) Patient reports pain as moderate.  Awake and alert no complaints.  Objective: Vital signs in last 24 hours: Temp:  [97 F (36.1 C)-98 F (36.7 C)] 98 F (36.7 C) (05/02 0610) Pulse Rate:  [46-102] 82 (05/02 0610) Resp:  [13-22] 18 (05/02 0610) BP: (66-138)/(23-94) 122/72 mmHg (05/02 0610) SpO2:  [91 %-100 %] 93 % (05/02 0610) Weight:  [54.432 kg (120 lb)] 54.432 kg (120 lb) (05/01 1620)  Intake/Output from previous day: 05/01 0701 - 05/02 0700 In: 4691.3 [P.O.:420; I.V.:4171.3; IV Piggyback:100] Out: 1600 [Urine:1350; Blood:250] Intake/Output this shift:     Recent Labs  10/04/13 0411  HGB 11.2*    Recent Labs  10/04/13 0411  WBC 8.2  RBC 3.97  HCT 34.6*  PLT 174    Recent Labs  10/04/13 0411  NA 134*  K 4.6  CL 98  CO2 28  BUN 12  CREATININE 0.70  GLUCOSE 134*  CALCIUM 8.4   No results found for this basename: LABPT, INR,  in the last 72 hours  Neurovascular intact Sensation intact distally Intact pulses distally Dorsiflexion/Plantar flexion intact Incision: dressing C/D/I Compartment soft  Assessment/Plan: 1 Day Post-Op Procedure(s) (LRB): LEFT TOTAL HIP ARTHROPLASTY ANTERIOR APPROACH and REMOVAL OF SCREWS LEFT HIP (Left) Up with therapy , reviewed films and will attempt full weight bearing  left leg. Monitor sodium  Erskine Emery 10/04/2013, 8:12 AM

## 2013-10-04 NOTE — Discharge Instructions (Signed)
Information on my medicine - ELIQUIS (apixaban)  This medication education was reviewed with me or my healthcare representative as part of my discharge preparation.  The pharmacist that spoke with me during my hospital stay was:  Rudean Haskell, [facility]  Why was Eliquis prescribed for you? Eliquis was prescribed for you to reduce the risk of forming blood clots that can cause a stroke if you have a medical condition called atrial fibrillation (a type of irregular heartbeat) OR to reduce the risk of a blood clots forming after orthopedic surgery.  What do You need to know about Eliquis ? Take your Eliquis TWICE DAILY - one tablet in the morning and one tablet in the evening with or without food.  It would be best to take the doses about the same time each day.  If you have difficulty swallowing the tablet whole please discuss with your pharmacist how to take the medication safely.  Take Eliquis exactly as prescribed by your doctor and DO NOT stop taking Eliquis without talking to the doctor who prescribed the medication.  Stopping may increase your risk of developing a new clot or stroke.  Refill your prescription before you run out.  After discharge, you should have regular check-up appointments with your healthcare provider that is prescribing your Eliquis.  In the future your dose may need to be changed if your kidney function or weight changes by a significant amount or as you get older.  What do you do if you miss a dose? If you miss a dose, take it as soon as you remember on the same day and resume taking twice daily.  Do not take more than one dose of ELIQUIS at the same time.  Important Safety Information A possible side effect of Eliquis is bleeding. You should call your healthcare provider right away if you experience any of the following:   Bleeding from an injury or your nose that does not stop.   Unusual colored urine (red or dark brown) or unusual colored stools (red or  black).   Unusual bruising for unknown reasons.   A serious fall or if you hit your head (even if there is no bleeding).  Some medicines may interact with Eliquis and might increase your risk of bleeding or clotting while on Eliquis. To help avoid this, consult your healthcare provider or pharmacist prior to using any new prescription or non-prescription medications, including herbals, vitamins, non-steroidal anti-inflammatory drugs (NSAIDs) and supplements.  This website has more information on Eliquis (apixaban): [email or web address].

## 2013-10-04 NOTE — Progress Notes (Signed)
Physical Therapy Treatment Patient Details Name: Regina [name] MRN: [id number] DOB: 01-24-1923 Today's Date: 2013-10-21    History of Present Illness      PT Comments    Pt very pleasant and motivated but with limited endurance and discomfort limiting progress.  Pt to/from bathroom including hygiene at sink but unable to complete ambulation back to bedside 2* fatigue.     Follow Up Recommendations  SNF     Equipment Recommendations  None recommended by PT    Recommendations for Other Services OT consult     Precautions / Restrictions Precautions Precautions: Fall Restrictions Weight Bearing Restrictions: No Other Position/Activity Restrictions: WBAT    Mobility  Bed Mobility Overal bed mobility: +2 for physical assistance;Needs Assistance Bed Mobility: Sit to Supine       Sit to supine: Min assist;Mod assist;+2 for physical assistance   General bed mobility comments: cues for sequence and use of R LE to self assist  Transfers Overall transfer level: Needs assistance Equipment used: Rolling walker (2 wheeled) Transfers: Sit to/from Stand Sit to Stand: Min assist         General transfer comment: cues for LE management and use of UEs to self assist  Ambulation/Gait Ambulation/Gait assistance: +2 physical assistance;Mod assist Ambulation Distance (Feet): 23 Feet (and 15) Assistive device: Rolling walker (2 wheeled) Gait Pattern/deviations: Step-to pattern;Decreased step length - right;Decreased step length - left;Shuffle;Trunk flexed;Antalgic Gait velocity: decr   General Gait Details: cues for posture, sequence, foot placement, stride length and position from RW   Stairs            Wheelchair Mobility    Modified Rankin (Stroke Patients Only)       Balance                                    Cognition Arousal/Alertness: Awake/alert Behavior During Therapy: WFL for tasks assessed/performed Overall Cognitive Status: Within  Functional Limits for tasks assessed                      Exercises      General Comments        Pertinent Vitals/Pain 6/10 with activity, ice pack provided    Home Living                      Prior Function            PT Goals (current goals can now be found in the care plan section) Acute Rehab PT Goals Patient Stated Goal: Rehab at SNF level and return home to ambulate without pain PT Goal Formulation: With patient Time For Goal Achievement: 10/11/13 Potential to Achieve Goals: Good Progress towards PT goals: Progressing toward goals    Frequency  7X/week    PT Plan Current plan remains appropriate    Co-evaluation             End of Session Equipment Utilized During Treatment: Gait belt Activity Tolerance: Patient tolerated treatment well;Patient limited by fatigue Patient left: in bed;with call bell/phone within reach;with family/visitor present     Time: 1343-1419 PT Time Calculation (min): 36 min  Charges:  $Gait Training: 8-22 mins $Therapeutic Activity: 8-22 mins                    G Codes:      Mathis Fare 21-Oct-2013, 3:49 PM

## 2013-10-04 NOTE — Progress Notes (Addendum)
Clinical Social Work Department CLINICAL SOCIAL WORK PLACEMENT NOTE 10/04/2013  Patient:  Regina [name],Regina [name]  Account Number:  [id number] Admit date:  10/03/2013  Clinical Social Worker:  Levie Heritage  Date/time:  10/04/2013 03:39 PM  Clinical Social Work is seeking post-discharge placement for this patient at the following level of care:   SKILLED NURSING   (*CSW will update this form in Epic as items are completed)   10/04/2013  Patient/family provided with [place] Department of Clinical Social Work's list of facilities offering this level of care within the geographic area requested by the patient (or if unable, by the patient's family).  10/04/2013  Patient/family informed of their freedom to choose among providers that offer the needed level of care, that participate in Medicare, Medicaid or managed care program needed by the patient, have an available bed and are willing to accept the patient.  10/04/2013  Patient/family informed of [facility]' ownership interest in [facility], as well as of the fact that they are under no obligation to receive care at this facility.  PASARR submitted to EDS on existing PASARR number received from EDS on   FL2 transmitted to all facilities in geographic area requested by pt/family on  10/04/2013 FL2 transmitted to all facilities within larger geographic area on   Patient informed that his/her managed care company has contracts with or will negotiate with  certain facilities, including the following:     Patient/family informed of bed offers received:   Patient chooses bed at  Physician recommends and patient chooses bed at    Patient to be transferred to  on   Patient to be transferred to facility by   The following physician request were entered in Epic:   Additional Comments:  Bernita Raisin, [place] [facility] [phone]

## 2013-10-04 NOTE — Progress Notes (Signed)
Clinical Social Work Department BRIEF PSYCHOSOCIAL ASSESSMENT 10/04/2013  Patient:  Regina [name],Regina [name]     Account Number:  [id number]     Admit date:  10/03/2013  Clinical Social Worker:  Levie Heritage  Date/Time:  10/04/2013 03:42 PM  Referred by:  Physician  Date Referred:  10/04/2013 Referred for  SNF Placement   Other Referral:   Interview type:  Patient Other interview type:    PSYCHOSOCIAL DATA Living Status:  ALONE Admitted from facility:   Level of care:   Primary support name:  Nadene Rubins Primary support relationship to patient:  FAMILY Degree of support available:   strong    CURRENT CONCERNS Current Concerns  Post-Acute Placement   Other Concerns:    SOCIAL WORK ASSESSMENT / PLAN CSW met with Pt to discuss d/c plans.    Pt stated that although her son and daughter-in-law live right next door, she does live alone and agrees that it wouldn't be safe for her to d/c home, at this time.  Pt stated that she agrees with the SNF recommendation and that she'd like to consider [name] in [facility], as opposed to [place], where she lives.    Pt has been to [facility] by hx but stated that she would not like to return there.  Pt stated, "You have more choices here in [place] than we do in [place]."  Pt has heard good things about [facility] and [facility] and is hopeful for either of those.    CSW provided Pt with [name]F list and thanked her for her time.   Assessment/plan status:  Psychosocial Support/Ongoing Assessment of Needs Other assessment/ plan:   Information/referral to community resources:   SNF list    PATIENT'S/FAMILY'S RESPONSE TO PLAN OF CARE: Pt was calm, cooperative and very pleasant.  Pt was realistic in her expectations and stated that she agrees that she needs SNF.  She is happy to go but even happier to return home once her rehab is complete.   Bernita Raisin, [facility] [place] [facility] [phone]

## 2013-10-04 NOTE — Evaluation (Signed)
Physical Therapy Evaluation Patient Details Name: Regina [name] MRN: [id number] DOB: [birth year] Today's Date: 10/04/2013   History of Present Illness     Clinical Impression  Pt s/p L THR presents with decreased L LE strength/ROM and post op pain limiting functional mobility.  Pt would benefit from follow up rehab at SNF level prior to return home with limited assist    Follow Up Recommendations SNF    Equipment Recommendations  None recommended by PT    Recommendations for Other Services OT consult     Precautions / Restrictions Precautions Precautions: Fall Restrictions Weight Bearing Restrictions: No Other Position/Activity Restrictions: WBAT      Mobility  Bed Mobility Overal bed mobility: Needs Assistance;+2 for physical assistance Bed Mobility: Supine to Sit     Supine to sit: +2 for physical assistance;Min assist;Mod assist     General bed mobility comments: cues for sequence and use of R LE to self assist  Transfers Overall transfer level: Needs assistance Equipment used: Rolling walker (2 wheeled) Transfers: Sit to/from Stand Sit to Stand: +2 physical assistance;Min assist;Mod assist         General transfer comment: cues for LE management and use of UEs to self assist  Ambulation/Gait Ambulation/Gait assistance: +2 physical assistance;Min assist Ambulation Distance (Feet): 7 Feet Assistive device: Rolling walker (2 wheeled) Gait Pattern/deviations: Step-to pattern;Decreased step length - right;Decreased step length - left;Shuffle;Trunk flexed Gait velocity: decr   General Gait Details: cues for posture, sequence, foot placement, stride length and position from [employer]            Wheelchair Mobility    Modified Rankin (Stroke Patients Only)       Balance                                             Pertinent Vitals/Pain 6/10; premed, ice packs provided    Home Living Family/patient expects to be discharged  to:: Skilled nursing facility Living Arrangements: Alone                    Prior Function Level of Independence: Independent with assistive device(s)         Comments: Used RW or WC     Hand Dominance   Dominant Hand: Right    Extremity/Trunk Assessment   Upper Extremity Assessment: Overall WFL for tasks assessed           Lower Extremity Assessment: LLE deficits/detail   LLE Deficits / Details: Hip strength 2-/5 with AAROM at hip to 80 flex and 15 abd  Cervical / Trunk Assessment: Kyphotic  Communication   Communication: HOH  Cognition Arousal/Alertness: Awake/alert Behavior During Therapy: WFL for tasks assessed/performed Overall Cognitive Status: Within Functional Limits for tasks assessed                      General Comments      Exercises Total Joint Exercises Ankle Circles/Pumps: AROM;Both;15 reps;Supine Quad Sets: AROM;Both;10 reps;Supine Heel Slides: AAROM;Left;15 reps;Supine Hip ABduction/ADduction: AAROM;Left;10 reps;Supine      Assessment/Plan    PT Assessment Patient needs continued PT services  PT Diagnosis Difficulty walking   PT Problem List Decreased strength;Decreased range of motion;Decreased activity tolerance;Decreased mobility;Decreased knowledge of use of DME;Pain;Decreased knowledge of precautions  PT Treatment Interventions DME instruction;Gait training;Stair training;Functional mobility training;Therapeutic activities;Therapeutic exercise;Patient/family education   PT  Goals (Current goals can be found in the Care Plan section) Acute Rehab PT Goals Patient Stated Goal: Rehab at SNF level and return home to ambulate without pain PT Goal Formulation: With patient Time For Goal Achievement: 10/11/13 Potential to Achieve Goals: Good    Frequency 7X/week   Barriers to discharge        Co-evaluation               End of Session Equipment Utilized During Treatment: Gait belt Activity Tolerance: Patient  tolerated treatment well Patient left: in chair;with call bell/phone within reach;with family/visitor present Nurse Communication: Mobility status         Time: 1130-1203 PT Time Calculation (min): 33 min   Charges:   PT Evaluation $Initial PT Evaluation Tier I: 1 Procedure PT Treatments $Gait Training: 8-22 mins $Therapeutic Exercise: 8-22 mins   PT G Codes:          Mathis Fare 10/04/2013, 12:22 PM

## 2013-10-04 NOTE — Progress Notes (Signed)
OT Cancellation Note  Patient Details Name: Regina [name] MRN: [id number] DOB: 05/11/1923   Cancelled Treatment:    Reason Eval/Treat Not Completed: Other (comment) Pt is Medicare/Medicaid and current D/C plan is SNF. No apparent immediate acute care OT needs, therefore will defer OT to SNF. If OT eval is needed please call Acute Rehab Dept. at [facility] 10/04/2013, 1:45 PM Lesle Chris, OTR/L [phone] 10/04/2013

## 2013-10-04 NOTE — Op Note (Signed)
[name], BILLS NO.:  [id number]  MEDICAL RECORD NO.:  [id number]  LOCATION:  66                         FACILITY:  [facility]  PHYSICIAN:  Lind Guest. 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  PROCEDURE: 1. Hardware removal of 3 cannulated screws from left hip. 2. Left total hip arthroplasty direct anterior approach.  IMPLANTS:  DePuy Sector Gription acetabular component size 50 with a paced eliminator guide in a single screw.  Size 32 plus 4 neutral polyethylene liner, size 11 Corail femoral component with standard offset, size 32 plus 9 metal hip ball.  SURGEON:  Lind Guest. Ninfa Linden, M.D.  ASSISTANT:  Erskine Emery, P.A.  ANESTHESIA:  Spinal.  ANTIBIOTICS:  2 g of IV Ancef.  BLOOD LOSS:  250-300 mL.  COMPLICATIONS:  None.  INDICATIONS:  Regina [name] is an 80 year old female who a year ago sustained a left displaced femoral neck fracture, did [place].  She had done well given her advanced age and knowing that some of these fractures do go on to nonunion or avascular necrosis and that is exactly what happened.  She has lost her fixation and the fractures become displaced.  She has had difficulty with ambulating and was sent to me for further evaluation.  I have recommended removal of the cannulated screws and then anterior hip replacement or placement of a total hip arthroplasty with a direct anterior  approach.  She understands this can be quite difficult due to her advanced age and soft bone as well as the anatomy.  DESCRIPTION OF PROCEDURE:  After informed consent was obtained, appropriate left leg was marked.  She was taken to the operating room and spinal anesthesia was obtained while she was on her stretcher. Foley catheter was placed and both legs had traction boots applied to them.  Next, she was placed in in-line skeletal traction, but no traction applied with the perineal post in place being placed supine on the Hana fracture table.  Her left operative hip was then prepped and draped with DuraPrep and sterile drapes.  A time-out was called and she was identified as correct patient, correct left hip.  I then made an incision on the lateral aspect of her hip through the proximal femur through the previous incision she had made and removed all 3 cancellous screws without difficulty.  We irrigated this wound with normal saline solution and closed the deep tissue with 0 Vicryl followed by 2-0 Vicryl in subcutaneous tissue and staples on the skin.  We then proceeded with a direct anterior approach to the hip.  I then made an incision just inferior and posterior to the anterior-superior iliac spine and carried this obliquely down the leg.  I dissected down to  the tensor fascia lata muscle and the tensor fascia was divided longitudinally, so we could proceed with a direct anterior approach to the hip.  We did open up the hip capsule, but found it significantly scarred and this is were the difficulty in the case arise.  We were able to move the femoral head and found a completely collapsed and displaced.  There were significant scar tissue throughout.  After a period of time of removing a lot of scar tissue, we were able to ream from a size 42 reamer and direct millimeter increments up to a size 49 with the last reamer 2 reamers under direct visualization and direct fluoroscopy.  I  then placed the real DePuy Sector Gription acetabulum component size 50, the Apex eliminator guide in a single screw.  We then placed a real 32+ 4 neutral polyethylene liner.  Attention was then turned to the femur.  Her femoral neck cut and remodeling was all the way down to the level of the calcar.  This was a quite difficult case.  We were able to bring the leg down and over and after externally rotated to 100 degrees, I protected the soft tissue around the hip with a Hohmann retractor and a Mueller and found our canal, then began broaching from a size 8 broach using the Corail broaching system up to a size of 11 with all broaches placed under visualization and each broach also placed under direct fluoroscopy due to the difficult nature of this case.  We then placed the real 11 femoral component with standard offset, and we trialed a 32+ 1 hip ball and reduced this in the pelvis and felt we needed to gain __________ dislocated hip and placed the real 32+ 9 metal hip ball, reduced this back in the acetabulum was stable throughout __________ rotation.  I then copiously irrigated the soft tissues with normal saline solution. There was no joint capsule to close.  We closed the iliotibial band with interrupted #1 Vicryl suture followed by 0 Vicryl in the deep tissue, 2- 0 Vicryl in subcutaneous tissue, and staples on the skin.  Xeroform and well-padded Aquacel dressing was applied.  She was taken to recovery room in stable condition.  All final counts were correct.  There were no complications noted.  Due to the very soft nature of her bone, right now, we are going to just have her touch down weightbearing to allow fillet time that things are healing properly and proceed with full weight bearing.  Of note, Erskine Emery, P.A. who has assisted in the entire case and his assistance was crucial for facilitating this case.     Lind Guest. Ninfa Linden, M.D.     CYB/MEDQ  D:  10/03/2013   T:  10/04/2013  Job:  [id number]

## 2013-10-05 LAB — CBC
HCT: 29.5 % — ABNORMAL LOW (ref 36.0–46.0)
Hemoglobin: 9.9 g/dL — ABNORMAL LOW (ref 12.0–15.0)
MCH: 28.8 pg (ref 26.0–34.0)
MCHC: 33.6 g/dL (ref 30.0–36.0)
MCV: 85.8 fL (ref 78.0–100.0)
Platelets: 174 10*3/uL (ref 150–400)
RBC: 3.44 MIL/uL — ABNORMAL LOW (ref 3.87–5.11)
RDW: 16.2 % — AB (ref 11.5–15.5)
WBC: 13.2 10*3/uL — AB (ref 4.0–10.5)

## 2013-10-05 MED ORDER — LIP MEDEX EX OINT
TOPICAL_OINTMENT | CUTANEOUS | Status: AC
  Filled 2013-10-05: qty 7

## 2013-10-05 NOTE — Progress Notes (Signed)
Pt has slept most of day & ate only small amounts. [employer]

## 2013-10-05 NOTE — Progress Notes (Signed)
Asked by RN to meet with Pt's family.  Met with Pt's family and answered all questions related to SNF.  CSW discussed the SNF process and provided emotional support.  Pt's family to be present in Pt's room tomorrow and would like to meet with CSW when CSW discusses bed offers.  CSW to notify Weekday CSW of family's request.  Bernita Raisin, [place] [employer] [phone]

## 2013-10-05 NOTE — Plan of Care (Signed)
Problem: Phase III Progression Outcomes Goal: Anticoagulant follow-up in place Outcome: Not Applicable Date Met:  10/05/13 eliquis     

## 2013-10-05 NOTE — Progress Notes (Signed)
Subjective: 2 Days Post-Op Procedure(s) (LRB): LEFT TOTAL HIP ARTHROPLASTY ANTERIOR APPROACH and REMOVAL OF SCREWS LEFT HIP (Left) Patient reports pain as moderate.  Very slow progress with therapy.  SNF recommended.  Acute blood loss anemia, but asymptomatic thus far.  Objective: Vital signs in last 24 hours: Temp:  [97.6 F (36.4 C)-98.7 F (37.1 C)] 98.7 F (37.1 C) (05/03 0541) Pulse Rate:  [72-90] 90 (05/03 0541) Resp:  [18] 18 (05/03 0541) BP: (105-150)/(62-84) 117/68 mmHg (05/03 0541) SpO2:  [88 %-100 %] 93 % (05/03 0541)  Intake/Output from previous day: 05/02 0701 - 05/03 0700 In: 1808.8 [P.O.:780; I.V.:1028.8] Out: 825 [Urine:825] Intake/Output this shift:     Recent Labs  10/04/13 0411 10/05/13 0439  HGB 11.2* 9.9*    Recent Labs  10/04/13 0411 10/05/13 0439  WBC 8.2 13.2*  RBC 3.97 3.44*  HCT 34.6* 29.5*  PLT 174 174    Recent Labs  10/04/13 0411  NA 134*  K 4.6  CL 98  CO2 28  BUN 12  CREATININE 0.70  GLUCOSE 134*  CALCIUM 8.4   No results found for this basename: LABPT, INR,  in the last 72 hours  Sensation intact distally Intact pulses distally Dorsiflexion/Plantar flexion intact Incision: scant drainage  Assessment/Plan: 2 Days Post-Op Procedure(s) (LRB): LEFT TOTAL HIP ARTHROPLASTY ANTERIOR APPROACH and REMOVAL OF SCREWS LEFT HIP (Left) Up with therapy Discharge to SNF in the next 1-2 days.  Mcarthur Rossetti 10/05/2013, 9:48 AM

## 2013-10-05 NOTE — Progress Notes (Signed)
Pt had brown emesis, no odor. Gave second med for nausea. [employer]

## 2013-10-05 NOTE — Progress Notes (Signed)
Physical Therapy Treatment Patient Details Name: LARI LINSON MRN: [id number] DOB: Oct 11, 1922 Today's Date: 10/14/13    History of Present Illness      PT Comments    Pt ltd this date 2* lethargy/fatigue/nausea.   Follow Up Recommendations  SNF     Equipment Recommendations  None recommended by PT    Recommendations for Other Services OT consult     Precautions / Restrictions Precautions Precautions: Fall Restrictions Weight Bearing Restrictions: No Other Position/Activity Restrictions: WBAT    Mobility  Bed Mobility Overal bed mobility:  (NT 2* pt fatigue/lethargy/nausea)                Transfers                    Ambulation/Gait                 Stairs            Wheelchair Mobility    Modified Rankin (Stroke Patients Only)       Balance                                    Cognition Arousal/Alertness: Awake/alert Behavior During Therapy: WFL for tasks assessed/performed Overall Cognitive Status: Within Functional Limits for tasks assessed                      Exercises Total Joint Exercises Ankle Circles/Pumps: AROM;Both;15 reps;Supine Short Arc Quad: AAROM;20 reps;Left;Supine Heel Slides: AAROM;Left;Supine;20 reps Hip ABduction/ADduction: AAROM;Left;Supine;15 reps    General Comments        Pertinent Vitals/Pain Min c/o pain during session with pt attempting to doze off with any rest breaks    Home Living                      Prior Function            PT Goals (current goals can now be found in the care plan section) Acute Rehab PT Goals Patient Stated Goal: Rehab at SNF level and return home to ambulate without pain PT Goal Formulation: With patient Time For Goal Achievement: 10/11/13 Potential to Achieve Goals: Good Progress towards PT goals: Progressing toward goals    Frequency  7X/week    PT Plan Current plan remains appropriate    Co-evaluation             End of Session   Activity Tolerance: Patient limited by fatigue;Patient limited by lethargy;Other (comment) (and nausea) Patient left: in bed;with call bell/phone within reach     Time: 1112-1130 PT Time Calculation (min): 18 min  Charges:  $Therapeutic Exercise: 8-22 mins                    G Codes:      Mathis Fare 10-14-2013, 11:45 AM

## 2013-10-06 LAB — CBC
HCT: 24 % — ABNORMAL LOW (ref 36.0–46.0)
Hemoglobin: 8 g/dL — ABNORMAL LOW (ref 12.0–15.0)
MCH: 28.5 pg (ref 26.0–34.0)
MCHC: 33.3 g/dL (ref 30.0–36.0)
MCV: 85.4 fL (ref 78.0–100.0)
PLATELETS: 161 10*3/uL (ref 150–400)
RBC: 2.81 MIL/uL — AB (ref 3.87–5.11)
RDW: 16.5 % — ABNORMAL HIGH (ref 11.5–15.5)
WBC: 14.2 10*3/uL — AB (ref 4.0–10.5)

## 2013-10-06 LAB — PREPARE RBC (CROSSMATCH)

## 2013-10-06 NOTE — Progress Notes (Signed)
CSW has met with pt / daughter to assist with d/c planning. Pt has chosen [facility] for [employer]. SNF bed will be available Tuesday if pt is stable for D/C. CSW will continue to follow to assist with d/c planning to SNF.  Werner Lean LCSW [phone]

## 2013-10-06 NOTE — Progress Notes (Signed)
Patient ID: Regina [name], female   DOB: 1922/10/06, 80 y.o.   MRN: [id number] I reviewed her labs.  Given her advanced age and her slow drop in hemaglobin, will transfuse only one unit of blood today and plan on discharge to SNF tomorrow 5/5.

## 2013-10-06 NOTE — Progress Notes (Signed)
Subjective: 3 Days Post-Op Procedure(s) (LRB): LEFT TOTAL HIP ARTHROPLASTY ANTERIOR APPROACH and REMOVAL OF SCREWS LEFT HIP (Left) Patient reports pain as 3 on 0-10 scale.  No complaints.  Objective: Vital signs in last 24 hours: Temp:  [98.1 F (36.7 C)-98.7 F (37.1 C)] 98.3 F (36.8 C) (05/04 0531) Pulse Rate:  [60-108] 96 (05/04 0531) Resp:  [16-18] 16 (05/04 0531) BP: (103-124)/(47-81) 115/68 mmHg (05/04 0531) SpO2:  [93 %-97 %] 97 % (05/04 0531)  Intake/Output from previous day: 05/03 0701 - 05/04 0700 In: 150 [P.O.:150] Out: 300 [Urine:300] Intake/Output this shift: Total I/O In: -  Out: 350 [Urine:350]   Recent Labs  10/04/13 0411 10/05/13 0439 10/06/13 0407  HGB 11.2* 9.9* 8.0*    Recent Labs  10/05/13 0439 10/06/13 0407  WBC 13.2* 14.2*  RBC 3.44* 2.81*  HCT 29.5* 24.0*  PLT 174 161    Recent Labs  10/04/13 0411  NA 134*  K 4.6  CL 98  CO2 28  BUN 12  CREATININE 0.70  GLUCOSE 134*  CALCIUM 8.4   No results found for this basename: LABPT, INR,  in the last 72 hours  Sensation intact distally Intact pulses distally Dorsiflexion/Plantar flexion intact Incision: moderate drainage Compartment soft  Assessment/Plan: 3 Days Post-Op Procedure(s) (LRB): LEFT TOTAL HIP ARTHROPLASTY ANTERIOR APPROACH and REMOVAL OF SCREWS LEFT HIP (Left) Up with therapy today  Transfuse 1 unit PRBC's due to acute blood loss anemia  Probable discharge to SNF tomorrow   Erskine Emery 10/06/2013, 8:37 AM

## 2013-10-06 NOTE — Progress Notes (Addendum)
Physical Therapy Treatment Patient Details Name: Regina [name] MRN: [id number] DOB: Aug 05, 1922 Today's Date: 10/21/13    History of Present Illness    DA L THA   PT Comments    progressing  Follow Up Recommendations  SNF     Equipment Recommendations  None recommended by PT    Recommendations for Other Services       Precautions / Restrictions Precautions Precautions: Fall Restrictions Other Position/Activity Restrictions: WBAT    Mobility  Bed Mobility Overal bed mobility: +2 for physical assistance;Needs Assistance Bed Mobility: Supine to Sit     Supine to sit: Mod assist     General bed mobility comments: cues for sequence and use of R LE to self assist  Transfers Overall transfer level: Needs assistance Equipment used: Rolling walker (2 wheeled) Transfers: Sit to/from Stand Sit to Stand: Mod assist         General transfer comment: cues for LE management and use of UEs to self assist  Ambulation/Gait Ambulation/Gait assistance: +2 physical assistance;Min assist Ambulation Distance (Feet): 12 Feet Assistive device: Rolling walker (2 wheeled) Gait Pattern/deviations: Step-to pattern;Trunk flexed;Decreased step length - right;Decreased step length - left Gait velocity: decr   General Gait Details: cues for posture, sequence, foot placement, stride length and position from [employer]            Wheelchair Mobility    Modified Rankin (Stroke Patients Only)       Balance                                    Cognition Arousal/Alertness: Awake/alert Behavior During Therapy: WFL for tasks assessed/performed Overall Cognitive Status: Within Functional Limits for tasks assessed                      Exercises Total Joint Exercises Quad Sets: AROM;Both;10 reps Gluteal Sets: Strengthening;10 reps;Both Heel Slides: AAROM;Left;10 reps    General Comments         Pertinent Vitals/Pain C/o left hip soreness with  activity    Home Living                      Prior Function            PT Goals (current goals can now be found in the care plan section) Acute Rehab PT Goals Time For Goal Achievement: 10/11/13 Potential to Achieve Goals: Good Progress towards PT goals: Progressing toward goals    Frequency  7X/week    PT Plan Current plan remains appropriate    Co-evaluation             End of Session Equipment Utilized During Treatment: Gait belt Activity Tolerance: Patient limited by pain;Patient limited by fatigue Patient left: in chair;with call bell/phone within reach;with family/visitor present     Time: 6659-9357 PT Time Calculation (min): 24 min  Charges:  $Gait Training: 8-22 mins $Therapeutic Exercise: 8-22 mins                    G Codes:      Neil Crouch 10/21/2013, 3:16 PM

## 2013-10-06 NOTE — Care Management Note (Signed)
    Page 1 of 1   10/06/2013     10:48:31 AM CARE MANAGEMENT NOTE 10/06/2013  Patient:  Regina [name]   Account Number:  [id number]  Date Initiated:  10/04/2013  Documentation initiated by:  [facility]  Subjective/Objective Assessment:   LEFT TOTAL KNEE ARTHROPLASTY (Left)     Action/Plan:   SNF for rehab   Anticipated DC Date:  10/06/2013   Anticipated DC Plan:  SKILLED NURSING FACILITY  In-house referral  Clinical Social Worker      DC Planning Services  CM consult      Choice offered to / List presented to:             Status of service:  Completed, signed off Medicare Important Message given?   (If response is "NO", the following Medicare IM given date fields will be blank) Date Medicare IM given:   Date Additional Medicare IM given:    Discharge Disposition:  Regina [name]  Per UR Regulation:  Reviewed for med. necessity/level of care/duration of stay  If discussed at [place] of Stay Meetings, dates discussed:    Comments:

## 2013-10-07 ENCOUNTER — Encounter (HOSPITAL_COMMUNITY): Payer: Self-pay | Admitting: Orthopaedic Surgery

## 2013-10-07 DIAGNOSIS — I4891 Unspecified atrial fibrillation: Secondary | ICD-10-CM | POA: Diagnosis not present

## 2013-10-07 DIAGNOSIS — D62 Acute posthemorrhagic anemia: Secondary | ICD-10-CM | POA: Diagnosis not present

## 2013-10-07 DIAGNOSIS — M87059 Idiopathic aseptic necrosis of unspecified femur: Secondary | ICD-10-CM | POA: Diagnosis not present

## 2013-10-07 DIAGNOSIS — Z96649 Presence of unspecified artificial hip joint: Secondary | ICD-10-CM | POA: Diagnosis not present

## 2013-10-07 DIAGNOSIS — M25559 Pain in unspecified hip: Secondary | ICD-10-CM | POA: Diagnosis not present

## 2013-10-07 DIAGNOSIS — M6281 Muscle weakness (generalized): Secondary | ICD-10-CM | POA: Diagnosis not present

## 2013-10-07 DIAGNOSIS — I6990 Unspecified sequelae of unspecified cerebrovascular disease: Secondary | ICD-10-CM | POA: Diagnosis not present

## 2013-10-07 DIAGNOSIS — M62838 Other muscle spasm: Secondary | ICD-10-CM | POA: Diagnosis not present

## 2013-10-07 DIAGNOSIS — E039 Hypothyroidism, unspecified: Secondary | ICD-10-CM | POA: Diagnosis not present

## 2013-10-07 DIAGNOSIS — M1990 Unspecified osteoarthritis, unspecified site: Secondary | ICD-10-CM | POA: Diagnosis not present

## 2013-10-07 DIAGNOSIS — R5383 Other fatigue: Secondary | ICD-10-CM | POA: Diagnosis not present

## 2013-10-07 DIAGNOSIS — M8700 Idiopathic aseptic necrosis of unspecified bone: Secondary | ICD-10-CM | POA: Diagnosis not present

## 2013-10-07 DIAGNOSIS — I10 Essential (primary) hypertension: Secondary | ICD-10-CM | POA: Diagnosis not present

## 2013-10-07 DIAGNOSIS — R269 Unspecified abnormalities of gait and mobility: Secondary | ICD-10-CM | POA: Diagnosis not present

## 2013-10-07 DIAGNOSIS — T8140XA Infection following a procedure, unspecified, initial encounter: Secondary | ICD-10-CM | POA: Diagnosis not present

## 2013-10-07 DIAGNOSIS — R5381 Other malaise: Secondary | ICD-10-CM | POA: Diagnosis not present

## 2013-10-07 DIAGNOSIS — Z471 Aftercare following joint replacement surgery: Secondary | ICD-10-CM | POA: Diagnosis not present

## 2013-10-07 LAB — CBC
HCT: 24.5 % — ABNORMAL LOW (ref 36.0–46.0)
HEMOGLOBIN: 8.3 g/dL — AB (ref 12.0–15.0)
MCH: 28.9 pg (ref 26.0–34.0)
MCHC: 33.9 g/dL (ref 30.0–36.0)
MCV: 85.4 fL (ref 78.0–100.0)
Platelets: 170 10*3/uL (ref 150–400)
RBC: 2.87 MIL/uL — AB (ref 3.87–5.11)
RDW: 16.6 % — ABNORMAL HIGH (ref 11.5–15.5)
WBC: 8.4 10*3/uL (ref 4.0–10.5)

## 2013-10-07 LAB — TYPE AND SCREEN
ABO/RH(D): A POS
Antibody Screen: NEGATIVE
UNIT DIVISION: 0

## 2013-10-07 MED ORDER — TRAMADOL HCL 50 MG PO TABS: 50.0000 mg | ORAL_TABLET | Freq: Four times a day (QID) | ORAL | Status: DC | PRN

## 2013-10-07 MED ORDER — APIXABAN 2.5 MG PO TABS: 2.5000 mg | ORAL_TABLET | Freq: Two times a day (BID) | ORAL | Status: DC

## 2013-10-07 NOTE — Progress Notes (Signed)
Subjective: 4 Days Post-Op Procedure(s) (LRB): LEFT TOTAL HIP ARTHROPLASTY ANTERIOR APPROACH and REMOVAL OF SCREWS LEFT HIP (Left) Patient reports pain as mild.  Up to bathroom this AM. Feels better since getting blood yesterday.   Objective: Vital signs in last 24 hours: Temp:  [98 F (36.7 C)-98.8 F (37.1 C)] 98 F (36.7 C) (05/05 0630) Pulse Rate:  [76-96] 94 (05/05 0630) Resp:  [14-18] 16 (05/05 0630) BP: (92-122)/(50-69) 92/50 mmHg (05/05 0630) SpO2:  [92 %-99 %] 92 % (05/05 0630)  Intake/Output from previous day: 05/04 0701 - 05/05 0700 In: 527.5 [P.O.:240; Blood:287.5] Out: 1200 [Urine:1200] Intake/Output this shift: Total I/O In: 240 [P.O.:240] Out: 300 [Urine:300]   Recent Labs  10/05/13 0439 10/06/13 0407  HGB 9.9* 8.0*    Recent Labs  10/05/13 0439 10/06/13 0407  WBC 13.2* 14.2*  RBC 3.44* 2.81*  HCT 29.5* 24.0*  PLT 174 161   No results found for this basename: NA, K, CL, CO2, BUN, CREATININE, GLUCOSE, CALCIUM,  in the last 72 hours No results found for this basename: LABPT, INR,  in the last 72 hours  Sensation intact distally Intact pulses distally Dorsiflexion/Plantar flexion intact Incision: scant drainage Compartment soft Wound benign left hip  Assessment/Plan: 4 Days Post-Op Procedure(s) (LRB): LEFT TOTAL HIP ARTHROPLASTY ANTERIOR APPROACH and REMOVAL OF SCREWS LEFT HIP (Left) Up with therapy Discharge to SNF Will check CBC prior to discharge Dressing changed left hip  Erskine Emery 10/07/2013, 8:32 AM

## 2013-10-07 NOTE — Progress Notes (Signed)
Verbal report given to nursing supervisor at [facility]

## 2013-10-07 NOTE — Discharge Summary (Signed)
Patient ID: Regina [name] MRN: [id number] DOB/AGE: 78-Aug-1924 78 y.o.  Admit date: 10/03/2013 Discharge date: 10/07/2013  Admission Diagnoses:  Principal Problem:   Avascular necrosis of bone of left hip Active Problems:   Status post THR (total hip replacement)   Discharge Diagnoses:  S/P left total hip arthroplasty  Acute blood loss anemia secondary to surgery asymptomatic s/p 1 unit of PRBC  Past Medical History  Diagnosis Date  . Hypertension   . Hypothyroidism   . Stroke 2003  . Arthritis   . History of blood transfusion   . Anemia   . Dysrhythmia 4/14    atrial fib post op  . H/O hiatal hernia     Surgeries: Procedure(s): LEFT TOTAL HIP ARTHROPLASTY ANTERIOR APPROACH and REMOVAL OF SCREWS LEFT HIP on 10/03/2013   Consultants:    Discharged Condition: Improved  Hospital Course: Regina [name] is an 80 y.o. female who was admitted 10/03/2013 for operative treatment ofAvascular necrosis of bone of left hip. Patient has severe unremitting pain that affects sleep, daily activities, and work/hobbies. After pre-op clearance the patient was taken to the operating room on 10/03/2013 and underwent  Procedure(s): LEFT TOTAL HIP ARTHROPLASTY ANTERIOR APPROACH and REMOVAL OF SCREWS LEFT HIP.    Patient was given perioperative antibiotics: Anti-infectives   Start     Dose/Rate Route Frequency Ordered Stop   10/03/13 1700  ceFAZolin (ANCEF) IVPB 1 g/50 mL premix     1 g 100 mL/hr over 30 Minutes Intravenous Every 6 hours 10/03/13 1523 10/03/13 2321   10/03/13 0738  ceFAZolin (ANCEF) IVPB 2 g/50 mL premix     2 g 100 mL/hr over 30 Minutes Intravenous On call to O.R. 10/03/13 0867 10/03/13 1008       Patient was given sequential compression devices, early ambulation, and chemoprophylaxis to prevent DVT.  Patient benefited maximally from hospital stay and there were no complications.    Recent vital signs: Patient Vitals for the past 24 hrs:  BP Temp Temp src Pulse Resp SpO2   10/07/13 0630 92/50 mmHg 98 F (36.7 C) Oral 94 16 92 %  10/06/13 2048 103/50 mmHg 98.5 F (36.9 C) Oral 76 16 97 %  10/06/13 1320 116/62 mmHg 98.1 F (36.7 C) Oral 80 18 99 %  10/06/13 1250 122/64 mmHg 98.4 F (36.9 C) Oral 88 18 98 %  10/06/13 1145 121/69 mmHg 98.8 F (37.1 C) Oral 86 18 98 %  10/06/13 1045 121/65 mmHg 98.8 F (37.1 C) Oral 96 18 96 %  10/06/13 0945 117/68 mmHg 98.3 F (36.8 C) Oral 88 14 98 %  10/06/13 0940 117/68 mmHg 98.3 F (36.8 C) Oral 88 14 98 %  10/06/13 0921 107/67 mmHg 98.1 F (36.7 C) Oral 89 16 98 %     Recent laboratory studies:  Recent Labs  10/06/13 0407 10/07/13 0840  WBC 14.2* 8.4  HGB 8.0* 8.3*  HCT 24.0* 24.5*  PLT 161 170     Discharge Medications:     Medication List         acetaminophen 650 MG CR tablet  Commonly known as:  TYLENOL  Take 650 mg by mouth every 8 (eight) hours as needed for pain.     apixaban 2.5 MG Tabs tablet  Commonly known as:  ELIQUIS  Take 1 tablet (2.5 mg total) by mouth 2 (two) times daily.     diltiazem 180 MG 24 hr capsule  Commonly known as:  CARDIZEM CD  Take 180 mg  by mouth every morning.     levothyroxine 100 MCG tablet  Commonly known as:  SYNTHROID, LEVOTHROID  Take 100 mcg by mouth daily before breakfast.     magnesium hydroxide 400 MG/5ML suspension  Commonly known as:  MILK OF MAGNESIA  Take 30 mLs by mouth daily as needed for constipation.     metoprolol tartrate 25 MG tablet  Commonly known as:  LOPRESSOR  Take 1 tablet (25 mg total) by mouth 2 (two) times daily.     traMADol 50 MG tablet  Commonly known as:  ULTRAM  Take 1 tablet (50 mg total) by mouth every 6 (six) hours as needed (Pain).        Diagnostic Studies: Dg Chest 2 View  09/25/2013   CLINICAL DATA:  Preop  EXAM: CHEST  2 VIEW  COMPARISON:  08/06/2012  FINDINGS: Cardiomegaly is noted. Large hiatal hernia measures 13.7 by 10.4 cm. No acute infiltrate or pulmonary edema. Osteopenia and mild degenerative  changes thoracic spine. Stable mild compression deformity lower thoracic spine.  IMPRESSION: No active disease.  Large hiatal hernia again noted.   Electronically Signed   By: Lahoma Crocker M.D.   On: 09/25/2013 16:48   Dg Hip Complete Left  10/03/2013   CLINICAL DATA:  Hip replacement surgery left anterior hip  EXAM: LEFT HIP - COMPLETE 2+ VIEW  COMPARISON:  11/18/2012  FINDINGS: Two intraoperative fluoroscopic spot images document changes of hip arthroplasty. Femoral and acetabular components project in expected location. No fracture or other evident complication.  IMPRESSION: Hip arthroplasty without apparent complication.   Electronically Signed   By: Arne Cleveland M.D.   On: 10/03/2013 12:24   Dg Pelvis Portable  10/03/2013   CLINICAL DATA:  Status post total left hip replacement  EXAM: PORTABLE PELVIS 1-2 VIEWS  COMPARISON:  None.  FINDINGS: Left total hip arthroplasty. No fracture or dislocation. Normal alignment. There are postsurgical changes in the surrounding soft tissues.  There is generalized osteopenia.  IMPRESSION: Left total hip arthroplasty without failure or complication. These results were called by telephone at the time of interpretation on 10/03/2013 at 1:16 PM to nurse [facility] in the PACU, who verbally acknowledged these results.   Electronically Signed   By: Kathreen Devoid   On: 10/03/2013 13:17   Dg Hip Portable 1 View Left  10/03/2013   CLINICAL DATA:  Left hip replacement.  EXAM: PORTABLE LEFT HIP - 1 VIEW  COMPARISON:  DG HIP COMPLETE*L* dated 10/03/2013  FINDINGS: Patient status post left hip replacement. Good anatomic alignment on portable cross-table view. No acute bony abnormality.  IMPRESSION: Patient status post left hip replacement with good anatomic alignment on cross-table lateral view.  These results will be called to the ordering clinician or representative by the Radiologist Assistant, and communication documented in the PACS Dashboard.   Electronically Signed   By: Marcello Moores   Register   On: 10/03/2013 13:28   Dg C-arm 61-120 Min-no Report  10/03/2013   CLINICAL DATA: left anterior hip   C-ARM 61-120 MINUTES  Fluoroscopy was utilized by the requesting physician.  No radiographic  interpretation.     Disposition: 01-Home or Self Care      Discharge Orders   Future Orders Complete By Expires   Discharge wound care:  As directed    Weight bearing as tolerated  As directed    Questions:     Laterality:     Extremity:           Signed:  Erskine Emery 10/07/2013, 8:59 AM

## 2013-10-07 NOTE — Progress Notes (Signed)
Physical Therapy Treatment Patient Details Name: Regina [name] MRN: [id number] DOB: Aug 26, 1922 Today's Date: 10/07/2013    History of Present Illness Regina [name]    PT Comments    *Regina [name] is progressing well with mobility. She walked 51' today with min A, distance limited by Regina hip pain. Ready to DC to SNF from PT standpoint. **  Follow Up Recommendations  SNF     Equipment Recommendations  None recommended by PT    Recommendations for Other Services       Precautions / Restrictions Precautions Precautions: Fall Restrictions Weight Bearing Restrictions: No Other Position/Activity Restrictions: WBAT    Mobility  Bed Mobility                  Transfers Overall transfer level: Needs assistance Equipment used: Rolling walker (2 wheeled) Transfers: Sit to/from Stand Sit to Stand: Min assist         General transfer comment: cues for LE management and use of UEs to self assist  Ambulation/Gait Ambulation/Gait assistance: Min assist Ambulation Distance (Feet): 25 Feet Assistive device: Rolling walker (2 wheeled) Gait Pattern/deviations: Step-to pattern;Trunk flexed Gait velocity: decr   General Gait Details: cues for posture, sequence, foot placement, stride length and position from RW; distance limited by pain   Stairs            Wheelchair Mobility    Modified Rankin (Stroke Patients Only)       Balance                                    Cognition Arousal/Alertness: Awake/alert Behavior During Therapy: WFL for tasks assessed/performed Overall Cognitive Status: Within Functional Limits for tasks assessed                      Exercises Total Joint Exercises Ankle Circles/Pumps: AROM;Both;15 reps;Supine Short Arc Quad: AAROM;Left;Supine;10 reps Heel Slides: AAROM;Left;Supine;10 reps Hip ABduction/ADduction: AAROM;Left;Supine;15 reps Long Arc Quad: AAROM;Left;10 reps;Seated    General Comments         Pertinent Vitals/Pain *5/10 Regina hip at rest and with walking Ice applied at end of PT session**    Home Living                      Prior Function            PT Goals (current goals can now be found in the care plan section) Acute Rehab PT Goals Patient Stated Goal: Rehab at SNF level and return home to ambulate without pain PT Goal Formulation: With patient Time For Goal Achievement: 10/11/13 Potential to Achieve Goals: Good Progress towards PT goals: Progressing toward goals    Frequency  7X/week    PT Plan Current plan remains appropriate    Co-evaluation             End of Session Equipment Utilized During Treatment: Gait belt Activity Tolerance: Patient limited by pain;Patient limited by fatigue Patient left: in chair;with call bell/phone within reach;with family/visitor present     Time: 5462-7035 PT Time Calculation (min): 23 min  Charges:  $Gait Training: 8-22 mins $Therapeutic Exercise: 8-22 mins                    G Codes:      Regina [name] 10/07/2013, 10:49 AM [phone]

## 2013-10-07 NOTE — Progress Notes (Signed)
Clinical Social Work Department CLINICAL SOCIAL WORK PLACEMENT NOTE 10/07/2013  Patient:  Regina [name], Regina [name]  Account Number:  [id number] Admit date:  10/03/2013  Clinical Social Worker:  Levie Heritage  Date/time:  10/04/2013 03:39 PM  Clinical Social Work is seeking post-discharge placement for this patient at the following level of care:   SKILLED NURSING   (*CSW will update this form in Epic as items are completed)   10/04/2013  Patient/family provided with [place] Department of Clinical Social Work's list of facilities offering this level of care within the geographic area requested by the patient (or if unable, by the patient's family).  10/04/2013  Patient/family informed of their freedom to choose among providers that offer the needed level of care, that participate in Medicare, Medicaid or managed care program needed by the patient, have an available bed and are willing to accept the patient.  10/04/2013  Patient/family informed of [facility]' ownership interest in [facility] as well as of the fact that they are under no obligation to receive care at this facility.  PASARR submitted to [employer] on  PASARR number received from [place] on   FL2 transmitted to all facilities in geographic area requested by pt/family on  10/04/2013 FL2 transmitted to all facilities within larger geographic area on   Patient informed that his/her managed care company has contracts with or will negotiate with  certain facilities, including the following:     Patient/family informed of bed offers received:  10/06/2013 Patient chooses bed at [facility]ician recommends and patient chooses bed at    Patient to be transferred to [facility] on  10/07/2013 Patient to be transferred to facility by [facility]  The following physician request were entered in Epic:   Additional Comments:  Werner Lean LCSW [phone]

## 2013-10-07 NOTE — Progress Notes (Signed)
Patient discharged to [facility]. Report given to nursing supervisor via phone. Discharge teaching reviewed with pt and daughter and both verbalized understanding. Dressing on left hip clean,dry, and intact upon discharge. IV dc'd. Pt transported via ambulance service.

## 2013-10-08 ENCOUNTER — Encounter: Payer: Self-pay | Admitting: *Deleted

## 2013-10-08 ENCOUNTER — Non-Acute Institutional Stay (SKILLED_NURSING_FACILITY): Payer: Medicare Other | Admitting: Adult Health

## 2013-10-08 DIAGNOSIS — D62 Acute posthemorrhagic anemia: Secondary | ICD-10-CM

## 2013-10-08 DIAGNOSIS — M87059 Idiopathic aseptic necrosis of unspecified femur: Secondary | ICD-10-CM | POA: Diagnosis not present

## 2013-10-08 DIAGNOSIS — I10 Essential (primary) hypertension: Secondary | ICD-10-CM | POA: Diagnosis not present

## 2013-10-08 DIAGNOSIS — M87052 Idiopathic aseptic necrosis of left femur: Secondary | ICD-10-CM

## 2013-10-08 DIAGNOSIS — K5900 Constipation, unspecified: Secondary | ICD-10-CM

## 2013-10-08 DIAGNOSIS — I4891 Unspecified atrial fibrillation: Secondary | ICD-10-CM

## 2013-10-08 DIAGNOSIS — Z96649 Presence of unspecified artificial hip joint: Secondary | ICD-10-CM

## 2013-10-08 DIAGNOSIS — E039 Hypothyroidism, unspecified: Secondary | ICD-10-CM

## 2013-10-09 ENCOUNTER — Non-Acute Institutional Stay (SKILLED_NURSING_FACILITY): Payer: Medicare Other | Admitting: Internal Medicine

## 2013-10-09 ENCOUNTER — Encounter: Payer: Self-pay | Admitting: Adult Health

## 2013-10-09 DIAGNOSIS — D62 Acute posthemorrhagic anemia: Secondary | ICD-10-CM | POA: Diagnosis not present

## 2013-10-09 DIAGNOSIS — I10 Essential (primary) hypertension: Secondary | ICD-10-CM | POA: Diagnosis not present

## 2013-10-09 DIAGNOSIS — E039 Hypothyroidism, unspecified: Secondary | ICD-10-CM | POA: Diagnosis not present

## 2013-10-09 DIAGNOSIS — M87059 Idiopathic aseptic necrosis of unspecified femur: Secondary | ICD-10-CM

## 2013-10-09 DIAGNOSIS — M87052 Idiopathic aseptic necrosis of left femur: Secondary | ICD-10-CM

## 2013-10-09 DIAGNOSIS — K5900 Constipation, unspecified: Secondary | ICD-10-CM | POA: Insufficient documentation

## 2013-10-09 NOTE — Progress Notes (Signed)
Patient ID: Frankey Poot, female   DOB: 1923-04-16, 80 y.o.   MRN: [id number]               PROGRESS NOTE  DATE: 10/08/2013  FACILITY: Nursing Home Location: [facility]  LEVEL OF CARE: SNF (31)  Acute Visit  CHIEF COMPLAINT:  Follow-up Hospitalization  HISTORY OF PRESENT ILLNESS: This is an  80 year old female who has been admitted to [facility] on 10/07/13 from [facility] with Avascular Necrosis S/P Left Total Hip Arthroplasty. She has been admitted for a short-term rehabilitation.  REASSESSMENT OF ONGOING PROBLEM(S):  HTN: Pt 's HTN remains stable.  Denies CP, sob, DOE, pedal edema, headaches, dizziness or visual disturbances.  No complications from the medications currently being used.  Last BP : 105/57  ANEMIA: The anemia has been stable. The patient denies fatigue, melena or hematochezia. No complications from the medications currently being used. 5/15 hgb 8.3  ATRIAL FIBRILLATION: the patients atrial fibrillation remains stable.  The patient denies DOE, tachycardia, orthopnea, transient neurological sx, pedal edema, palpitations, & PNDs.  No complications noted from the medications currently being used.   PAST MEDICAL HISTORY : Reviewed.  No changes/see problem list  CURRENT MEDICATIONS: Reviewed per MAR/see medication list  REVIEW OF SYSTEMS:  GENERAL: no change in appetite, no fatigue, no weight changes, no fever, chills or weakness RESPIRATORY: no cough, SOB, DOE, wheezing, hemoptysis CARDIAC: no chest pain, edema or palpitations GI: no abdominal pain, diarrhea, heart burn, nausea or vomiting, +constipation  PHYSICAL EXAMINATION  GENERAL: no acute distress, normal body habitus EYES: conjunctivae normal, sclerae normal, normal eye lids NECK: supple, trachea midline, no neck masses, no thyroid tenderness, no thyromegaly LYMPHATICS: no LAN in the neck, no supraclavicular LAN RESPIRATORY: breathing is even & unlabored, BS CTAB CARDIAC:  RRR, no murmur,no extra heart sounds, no edema GI: abdomen soft, normal BS, no masses, no tenderness, no hepatomegaly, no splenomegaly PSYCHIATRIC: the patient is alert & oriented to person, affect & behavior appropriate  LABS/RADIOLOGY: Labs reviewed: Basic Metabolic Panel:  Recent Labs  09/25/13 1515 10/04/13 0411  NA 135* 134*  K 5.5* 4.6  CL 96 98  CO2 28 28  GLUCOSE 84 134*  BUN 19 12  CREATININE 0.78 0.70  CALCIUM 9.9 8.4   CBC:  Recent Labs  10/05/13 0439 10/06/13 0407 10/07/13 0840  WBC 13.2* 14.2* 8.4  HGB 9.9* 8.0* 8.3*  HCT 29.5* 24.0* 24.5*  MCV 85.8 85.4 85.4  PLT 174 161 170    ASSESSMENT/PLAN:  Avascular necrosis of left hip status post left total hip arthroplasty - for rehabilitation Anemia, acute blood loss - stable Hypertension - well controlled; continue diltiazem and Lopressor Hypothyroidism - continue Synthroid Atrial fibrillation - continue at this and Lopressor Constipation - start Colace 100 mg by mouth twice a day, MiraLax 17 g last 6-8 ounces liquid by mouth daily and Dulcolax suppository one per rectum daily when necessary   CPT CODE: 35701  Wilkin Lippy Vargas - NP [facility] [phone]

## 2013-10-10 NOTE — Progress Notes (Signed)
HISTORY & PHYSICAL  DATE: 10/09/2013   FACILITY: [facility]  LEVEL OF CARE: SNF (31)  ALLERGIES:  No Known Allergies  CHIEF COMPLAINT:  Manage avascular necrosis of the left hip, hypothyroidism and acute blood loss anemia  HISTORY OF PRESENT ILLNESS: Patient is an 80 year old Caucasian female.  AVASCULAR NECROSIS: This patient was having avascular necrosis of the left hip. She was having severe unremitting pain that was affecting sleep, and daily activities and work.  Gout 4 she underwent left total hip arthroplasty and removal of screws of the left hip. She tolerated the procedure well. She is admitted to this facility for short-term rehabilitation. She denies left hip pain. She is tolerating her pain medications without any adverse reactions.  ANEMIA: The anemia has been stable. The patient denies fatigue, melena or hematochezia. No complications from the medications currently being used. Postoperatively the patient suffered acute blood loss anemia and received one unit of packed red blood cells.  HYPOTHYROIDISM: The hypothyroidism remains stable. No complications noted from the medications presently being used.  The patient denies fatigue or constipation.  Last TSH not available.  PAST MEDICAL HISTORY :  Past Medical History  Diagnosis Date  . Hypertension   . Hypothyroidism   . Stroke 2003  . Arthritis   . History of blood transfusion   . Anemia   . Dysrhythmia 4/14    atrial fib post op  . H/O hiatal hernia   . Avascular necrosis of bone of left hip     PAST SURGICAL HISTORY: Past Surgical History  Procedure Laterality Date  . Eye surgery    . Mouth sugery      Upper teeth impacted  . Hip pinning,cannulated Left 09/13/2012    Procedure: ASNIS HIP PINNING LEFT;  Surgeon: Sanjuana Kava, MD;  Location: [facility];  Service: Orthopedics;  Laterality: Left;  . Total hip arthroplasty Left 10/03/2013    Procedure: LEFT TOTAL HIP ARTHROPLASTY  ANTERIOR APPROACH and REMOVAL OF SCREWS LEFT HIP;  Surgeon: Mcarthur Rossetti, MD;  Location: [facility];  Service: Orthopedics;  Laterality: Left;    SOCIAL HISTORY:  reports that she has never smoked. She has never used smokeless tobacco. She reports that she does not drink alcohol or use illicit drugs.  FAMILY HISTORY:  Family History  Problem Relation Age of Onset  . Heart attack Mother   . Heart attack Father   . Cancer Brother     CURRENT MEDICATIONS: Reviewed per MAR/see medication list  REVIEW OF SYSTEMS:  See HPI otherwise 14 point ROS is negative.  PHYSICAL EXAMINATION  VS:  See VS section  GENERAL: no acute distress, normal body habitus EYES: conjunctivae normal, sclerae normal, normal eye lids MOUTH/THROAT: lips without lesions,no lesions in the mouth,tongue is without lesions,uvula elevates in midline NECK: supple, trachea midline, no neck masses, no thyroid tenderness, no thyromegaly LYMPHATICS: no LAN in the neck, no supraclavicular LAN RESPIRATORY: breathing is even & unlabored, BS CTAB CARDIAC: RRR, no murmur,no extra heart sounds, no edema GI:  ABDOMEN: abdomen soft, normal BS, no masses, no tenderness  LIVER/SPLEEN: no hepatomegaly, no splenomegaly MUSCULOSKELETAL: HEAD: normal to inspection & palpation BACK: no kyphosis, scoliosis or spinal processes tenderness EXTREMITIES: LEFT UPPER EXTREMITY: full range of motion, normal strength & tone RIGHT UPPER EXTREMITY:  full range of motion, normal strength & tone LEFT LOWER EXTREMITY:  range of motion not tested due to surgery, normal strength & tone RIGHT LOWER EXTREMITY:  Moderate range of motion, normal strength & tone PSYCHIATRIC: the patient is alert & oriented to person, affect & behavior appropriate  LABS/RADIOLOGY:  Labs reviewed: Basic Metabolic Panel:  Recent Labs  09/25/13 1515 10/04/13 0411  NA 135* 134*  K 5.5* 4.6  CL 96 98  CO2 28 28  GLUCOSE 84 134*  BUN 19 12  CREATININE 0.78  0.70  CALCIUM 9.9 8.4   CBC:  Recent Labs  10/05/13 0439 10/06/13 0407 10/07/13 0840  WBC 13.2* 14.2* 8.4  HGB 9.9* 8.0* 8.3*  HCT 29.5* 24.0* 24.5*  MCV 85.8 85.4 85.4  PLT 174 161 170    LEFT HIP - COMPLETE 2+ VIEW   COMPARISON:  11/18/2012   FINDINGS: Two intraoperative fluoroscopic spot images document changes of hip arthroplasty. Femoral and acetabular components project in expected location. No fracture or other evident complication.   IMPRESSION: Hip arthroplasty without apparent complication. PORTABLE PELVIS 1-2 VIEWS   COMPARISON:  None.   FINDINGS: Left total hip arthroplasty. No fracture or dislocation. Normal alignment. There are postsurgical changes in the surrounding soft tissues.   There is generalized osteopenia.   IMPRESSION: Left total hip arthroplasty without failure or complication. These results were called by telephone at the time of interpretation on 10/03/2013 at 1:16 PM to nurse [facility] in the PACU, who verbally acknowledged these results.     PORTABLE LEFT HIP - 1 VIEW   COMPARISON:  DG HIP COMPLETE*L* dated 10/03/2013   FINDINGS: Patient status post left hip replacement. Good anatomic alignment on portable cross-table view. No acute bony abnormality.   IMPRESSION: Patient status post left hip replacement with good anatomic alignment on cross-table lateral view.   These results will be called to the ordering clinician or representative by the Radiologist Assistant, and communication documented in the PACS Dashboard.   ASSESSMENT/PLAN:  Left hip avascular necrosis-status post arthroplasty. Continue rehabilitation. Acute blood loss anemia-status post transfusion. Recheck Hypothyroidism-continue levothyroxine  hypertension-well-controlled Arthritis-denies pain Check CBC and BMP  I have reviewed patient's medical records received at admission/from hospitalization.  CPT CODE: 02585  Kiyra Slaubaugh Y Costa Jha, [place] [phone]

## 2013-10-16 DIAGNOSIS — M25559 Pain in unspecified hip: Secondary | ICD-10-CM | POA: Diagnosis not present

## 2013-10-17 ENCOUNTER — Encounter: Payer: Self-pay | Admitting: Adult Health

## 2013-10-17 ENCOUNTER — Non-Acute Institutional Stay (SKILLED_NURSING_FACILITY): Payer: Medicare Other | Admitting: Adult Health

## 2013-10-17 DIAGNOSIS — Z96649 Presence of unspecified artificial hip joint: Secondary | ICD-10-CM | POA: Diagnosis not present

## 2013-10-17 DIAGNOSIS — M87052 Idiopathic aseptic necrosis of left femur: Secondary | ICD-10-CM

## 2013-10-17 DIAGNOSIS — M87059 Idiopathic aseptic necrosis of unspecified femur: Secondary | ICD-10-CM

## 2013-10-17 DIAGNOSIS — T8140XA Infection following a procedure, unspecified, initial encounter: Secondary | ICD-10-CM | POA: Diagnosis not present

## 2013-10-17 DIAGNOSIS — T8149XA Infection following a procedure, other surgical site, initial encounter: Secondary | ICD-10-CM | POA: Insufficient documentation

## 2013-10-17 NOTE — Progress Notes (Signed)
Patient ID: Regina [name], female   DOB: 1923-05-09, 80 y.o.   MRN: [id number]         PROGRESS NOTE  DATE: 10/17/13  FACILITY: Nursing Home Location: [facility]  LEVEL OF CARE: SNF (31)  Acute Visit  CHIEF COMPLAINT:  Manage Surgical Wound Infection  HISTORY OF PRESENT ILLNESS: This is an  80 year old female who was noted to have moderate amount of serosanguinous drainage coming from left hip surgical site. Noted site to have erythema and yellowish tissues on tip of left hip surgical site. She is a recent Left Total Hip Arthroplasty. Patient is afebrile.   PAST MEDICAL HISTORY : Reviewed.  No changes/see problem list  CURRENT MEDICATIONS: Reviewed per MAR/see medication list  REVIEW OF SYSTEMS:  GENERAL: no change in appetite, no fatigue, no weight changes, no fever, chills or weakness RESPIRATORY: no cough, SOB, DOE, wheezing, hemoptysis CARDIAC: no chest pain, edema or palpitations GI: no abdominal pain, diarrhea, heart burn, nausea or vomiting, +constipation  PHYSICAL EXAMINATION  GENERAL: no acute distress, normal body habitus SKIN: see HPI NECK: supple, trachea midline, no neck masses, no thyroid tenderness, no thyromegaly LYMPHATICS: no LAN in the neck, no supraclavicular LAN RESPIRATORY: breathing is even & unlabored, BS CTAB CARDIAC: RRR, no murmur,no extra heart sounds, no edema GI: abdomen soft, normal BS, no masses, no tenderness, no hepatomegaly, no splenomegaly PSYCHIATRIC: the patient is alert & oriented to person, affect & behavior appropriate  LABS/RADIOLOGY: Labs reviewed: Basic Metabolic Panel:  Recent Labs  09/25/13 1515 10/04/13 0411  NA 135* 134*  K 5.5* 4.6  CL 96 98  CO2 28 28  GLUCOSE 84 134*  BUN 19 12  CREATININE 0.78 0.70  CALCIUM 9.9 8.4   CBC:  Recent Labs  10/05/13 0439 10/06/13 0407 10/07/13 0840  WBC 13.2* 14.2* 8.4  HGB 9.9* 8.0* 8.3*  HCT 29.5* 24.0* 24.5*  MCV 85.8 85.4 85.4  PLT 174 161 170     ASSESSMENT/PLAN:  Avascular necrosis of left hip status post left total hip arthroplasty - continue rehabilitation Left hip surgical wound infection - start Doxycycline 100 mg PO BID x 10 days; notify ortho surgeon regarding drainage and new orders.  CPT CODE: 29562  Seth Bake - NP [facility] [phone]

## 2013-11-04 ENCOUNTER — Non-Acute Institutional Stay (SKILLED_NURSING_FACILITY): Payer: Medicare Other | Admitting: Adult Health

## 2013-11-04 ENCOUNTER — Encounter: Payer: Self-pay | Admitting: Adult Health

## 2013-11-04 DIAGNOSIS — M87052 Idiopathic aseptic necrosis of left femur: Secondary | ICD-10-CM

## 2013-11-04 DIAGNOSIS — D62 Acute posthemorrhagic anemia: Secondary | ICD-10-CM

## 2013-11-04 DIAGNOSIS — I4891 Unspecified atrial fibrillation: Secondary | ICD-10-CM

## 2013-11-04 DIAGNOSIS — K5900 Constipation, unspecified: Secondary | ICD-10-CM

## 2013-11-04 DIAGNOSIS — I10 Essential (primary) hypertension: Secondary | ICD-10-CM

## 2013-11-04 DIAGNOSIS — M87059 Idiopathic aseptic necrosis of unspecified femur: Secondary | ICD-10-CM | POA: Diagnosis not present

## 2013-11-04 DIAGNOSIS — Z96649 Presence of unspecified artificial hip joint: Secondary | ICD-10-CM

## 2013-11-04 DIAGNOSIS — E039 Hypothyroidism, unspecified: Secondary | ICD-10-CM

## 2013-11-04 NOTE — Progress Notes (Signed)
Patient ID: Regina [name], female   DOB: 1923-05-14, 80 y.o.   MRN: [id number]                 PROGRESS NOTE  DATE: 11/04/13  FACILITY: Nursing Home Location: [facility]  LEVEL OF CARE: SNF (31)  Acute Visit  CHIEF COMPLAINT:  Discharge Notes  HISTORY OF PRESENT ILLNESS: This is an  80 year old female who is for discharge home with Home health PT, OT and Nursing. She has been admitted to [facility] on 10/07/13 from [facility] with Avascular Necrosis S/P Left Total Hip Arthroplasty. Patient was admitted to this facility for short-term rehabilitation after the patient's recent hospitalization.  Patient has completed SNF rehabilitation and therapy has cleared the patient for discharge.  REASSESSMENT OF ONGOING PROBLEM(S):  HTN: Pt 's HTN remains stable.  Denies CP, sob, DOE, pedal edema, headaches, dizziness or visual disturbances.  No complications from the medications currently being used.  Last BP : 122/53  ATRIAL FIBRILLATION: the patients atrial fibrillation remains stable.  The patient denies DOE, tachycardia, orthopnea, transient neurological sx, pedal edema, palpitations, & PNDs.  No complications noted from the medications currently being used.  CONSTIPATION: The constipation remains stable. No complications from the medications presently being used. Patient denies ongoing constipation, abdominal pain, nausea or vomiting.  PAST MEDICAL HISTORY : Reviewed.  No changes/see problem list  CURRENT MEDICATIONS: Reviewed per MAR/see medication list  REVIEW OF SYSTEMS:  GENERAL: no change in appetite, no fatigue, no weight changes, no fever, chills or weakness RESPIRATORY: no cough, SOB, DOE, wheezing, hemoptysis CARDIAC: no chest pain, edema or palpitations GI: no abdominal pain, diarrhea, heart burn, nausea or vomiting, +constipation  PHYSICAL EXAMINATION  GENERAL: no acute distress, normal body habitus NECK: supple, trachea midline, no neck  masses, no thyroid tenderness, no thyromegaly RESPIRATORY: breathing is even & unlabored, BS CTAB CARDIAC: RRR, no murmur,no extra heart sounds, no edema GI: abdomen soft, normal BS, no masses, no tenderness, no hepatomegaly, no splenomegaly EXTREMITIES: able to move all 4 extremities; ambulates with walker  PSYCHIATRIC: the patient is alert & oriented to person, affect & behavior appropriate  LABS/RADIOLOGY: 10/20/13  WBC 6.4 hemoglobin 9.1 hematocrit 31.1 10/10/13  sodium 136 potassium 4.0 glucose 103 BUN 11 creatinine 0.5 calcium 8.1 WBC 8.3 hemoglobin 8.5 hematocrit 28.2 Labs reviewed: Basic Metabolic Panel:  Recent Labs  09/25/13 1515 10/04/13 0411  NA 135* 134*  K 5.5* 4.6  CL 96 98  CO2 28 28  GLUCOSE 84 134*  BUN 19 12  CREATININE 0.78 0.70  CALCIUM 9.9 8.4   CBC:  Recent Labs  10/05/13 0439 10/06/13 0407 10/07/13 0840  WBC 13.2* 14.2* 8.4  HGB 9.9* 8.0* 8.3*  HCT 29.5* 24.0* 24.5*  MCV 85.8 85.4 85.4  PLT 174 161 170    ASSESSMENT/PLAN:  Avascular necrosis of left hip status post left total hip arthroplasty - for home health PT, OT and Nursing Anemia, acute blood loss - stable Hypertension - well controlled; continue diltiazem and Lopressor Hypothyroidism - continue Synthroid Atrial fibrillation - continue Eliquis and Lopressor Constipation - stable; continue Colace and Miralax   I have filled out patient's discharge paperwork and written prescriptions.  Patient will receive home health PT, OT and Nursing.  Total discharge time: Less than 30 minutes Discharge time involved coordination of the discharge process with Education officer, museum, nursing staff and therapy department. Medical justification for home health services verified.   CPT CODE: 89211  [facility]  Louretta Parma - NP [facility] [phone]

## 2013-11-06 ENCOUNTER — Encounter: Payer: Self-pay | Admitting: *Deleted

## 2013-11-09 DIAGNOSIS — M129 Arthropathy, unspecified: Secondary | ICD-10-CM | POA: Diagnosis not present

## 2013-11-09 DIAGNOSIS — Z96649 Presence of unspecified artificial hip joint: Secondary | ICD-10-CM | POA: Diagnosis not present

## 2013-11-09 DIAGNOSIS — Z8673 Personal history of transient ischemic attack (TIA), and cerebral infarction without residual deficits: Secondary | ICD-10-CM | POA: Diagnosis not present

## 2013-11-09 DIAGNOSIS — I10 Essential (primary) hypertension: Secondary | ICD-10-CM | POA: Diagnosis not present

## 2013-11-09 DIAGNOSIS — Z9181 History of falling: Secondary | ICD-10-CM | POA: Diagnosis not present

## 2013-11-09 DIAGNOSIS — Z471 Aftercare following joint replacement surgery: Secondary | ICD-10-CM | POA: Diagnosis not present

## 2013-11-09 DIAGNOSIS — I4891 Unspecified atrial fibrillation: Secondary | ICD-10-CM | POA: Diagnosis not present

## 2013-11-10 DIAGNOSIS — Z96649 Presence of unspecified artificial hip joint: Secondary | ICD-10-CM | POA: Diagnosis not present

## 2013-11-10 DIAGNOSIS — I10 Essential (primary) hypertension: Secondary | ICD-10-CM | POA: Diagnosis not present

## 2013-11-10 DIAGNOSIS — Z471 Aftercare following joint replacement surgery: Secondary | ICD-10-CM | POA: Diagnosis not present

## 2013-11-10 DIAGNOSIS — M129 Arthropathy, unspecified: Secondary | ICD-10-CM | POA: Diagnosis not present

## 2013-11-10 DIAGNOSIS — I4891 Unspecified atrial fibrillation: Secondary | ICD-10-CM | POA: Diagnosis not present

## 2013-11-10 DIAGNOSIS — Z9181 History of falling: Secondary | ICD-10-CM | POA: Diagnosis not present

## 2013-11-12 DIAGNOSIS — M129 Arthropathy, unspecified: Secondary | ICD-10-CM | POA: Diagnosis not present

## 2013-11-12 DIAGNOSIS — I10 Essential (primary) hypertension: Secondary | ICD-10-CM | POA: Diagnosis not present

## 2013-11-12 DIAGNOSIS — I4891 Unspecified atrial fibrillation: Secondary | ICD-10-CM | POA: Diagnosis not present

## 2013-11-12 DIAGNOSIS — Z471 Aftercare following joint replacement surgery: Secondary | ICD-10-CM | POA: Diagnosis not present

## 2013-11-12 DIAGNOSIS — Z9181 History of falling: Secondary | ICD-10-CM | POA: Diagnosis not present

## 2013-11-12 DIAGNOSIS — Z96649 Presence of unspecified artificial hip joint: Secondary | ICD-10-CM | POA: Diagnosis not present

## 2013-11-14 DIAGNOSIS — Z96649 Presence of unspecified artificial hip joint: Secondary | ICD-10-CM | POA: Diagnosis not present

## 2013-11-14 DIAGNOSIS — M129 Arthropathy, unspecified: Secondary | ICD-10-CM | POA: Diagnosis not present

## 2013-11-14 DIAGNOSIS — I10 Essential (primary) hypertension: Secondary | ICD-10-CM | POA: Diagnosis not present

## 2013-11-14 DIAGNOSIS — Z9181 History of falling: Secondary | ICD-10-CM | POA: Diagnosis not present

## 2013-11-14 DIAGNOSIS — I4891 Unspecified atrial fibrillation: Secondary | ICD-10-CM | POA: Diagnosis not present

## 2013-11-14 DIAGNOSIS — Z471 Aftercare following joint replacement surgery: Secondary | ICD-10-CM | POA: Diagnosis not present

## 2013-11-17 DIAGNOSIS — Z9181 History of falling: Secondary | ICD-10-CM | POA: Diagnosis not present

## 2013-11-17 DIAGNOSIS — Z471 Aftercare following joint replacement surgery: Secondary | ICD-10-CM | POA: Diagnosis not present

## 2013-11-17 DIAGNOSIS — Z96649 Presence of unspecified artificial hip joint: Secondary | ICD-10-CM | POA: Diagnosis not present

## 2013-11-17 DIAGNOSIS — I10 Essential (primary) hypertension: Secondary | ICD-10-CM | POA: Diagnosis not present

## 2013-11-17 DIAGNOSIS — M129 Arthropathy, unspecified: Secondary | ICD-10-CM | POA: Diagnosis not present

## 2013-11-17 DIAGNOSIS — I4891 Unspecified atrial fibrillation: Secondary | ICD-10-CM | POA: Diagnosis not present

## 2013-11-18 DIAGNOSIS — M129 Arthropathy, unspecified: Secondary | ICD-10-CM | POA: Diagnosis not present

## 2013-11-18 DIAGNOSIS — I4891 Unspecified atrial fibrillation: Secondary | ICD-10-CM | POA: Diagnosis not present

## 2013-11-18 DIAGNOSIS — I10 Essential (primary) hypertension: Secondary | ICD-10-CM | POA: Diagnosis not present

## 2013-11-18 DIAGNOSIS — Z9181 History of falling: Secondary | ICD-10-CM | POA: Diagnosis not present

## 2013-11-18 DIAGNOSIS — Z96649 Presence of unspecified artificial hip joint: Secondary | ICD-10-CM | POA: Diagnosis not present

## 2013-11-18 DIAGNOSIS — Z471 Aftercare following joint replacement surgery: Secondary | ICD-10-CM | POA: Diagnosis not present

## 2013-11-19 DIAGNOSIS — Z96649 Presence of unspecified artificial hip joint: Secondary | ICD-10-CM | POA: Diagnosis not present

## 2013-11-19 DIAGNOSIS — I10 Essential (primary) hypertension: Secondary | ICD-10-CM | POA: Diagnosis not present

## 2013-11-19 DIAGNOSIS — Z471 Aftercare following joint replacement surgery: Secondary | ICD-10-CM | POA: Diagnosis not present

## 2013-11-19 DIAGNOSIS — I4891 Unspecified atrial fibrillation: Secondary | ICD-10-CM | POA: Diagnosis not present

## 2013-11-19 DIAGNOSIS — M129 Arthropathy, unspecified: Secondary | ICD-10-CM | POA: Diagnosis not present

## 2013-11-19 DIAGNOSIS — Z9181 History of falling: Secondary | ICD-10-CM | POA: Diagnosis not present

## 2013-11-20 DIAGNOSIS — Z9181 History of falling: Secondary | ICD-10-CM | POA: Diagnosis not present

## 2013-11-20 DIAGNOSIS — I4891 Unspecified atrial fibrillation: Secondary | ICD-10-CM | POA: Diagnosis not present

## 2013-11-20 DIAGNOSIS — M129 Arthropathy, unspecified: Secondary | ICD-10-CM | POA: Diagnosis not present

## 2013-11-20 DIAGNOSIS — I10 Essential (primary) hypertension: Secondary | ICD-10-CM | POA: Diagnosis not present

## 2013-11-20 DIAGNOSIS — Z471 Aftercare following joint replacement surgery: Secondary | ICD-10-CM | POA: Diagnosis not present

## 2013-11-20 DIAGNOSIS — Z96649 Presence of unspecified artificial hip joint: Secondary | ICD-10-CM | POA: Diagnosis not present

## 2013-11-21 DIAGNOSIS — M129 Arthropathy, unspecified: Secondary | ICD-10-CM | POA: Diagnosis not present

## 2013-11-21 DIAGNOSIS — Z9181 History of falling: Secondary | ICD-10-CM | POA: Diagnosis not present

## 2013-11-21 DIAGNOSIS — I4891 Unspecified atrial fibrillation: Secondary | ICD-10-CM | POA: Diagnosis not present

## 2013-11-21 DIAGNOSIS — Z96649 Presence of unspecified artificial hip joint: Secondary | ICD-10-CM | POA: Diagnosis not present

## 2013-11-21 DIAGNOSIS — Z471 Aftercare following joint replacement surgery: Secondary | ICD-10-CM | POA: Diagnosis not present

## 2013-11-21 DIAGNOSIS — I10 Essential (primary) hypertension: Secondary | ICD-10-CM | POA: Diagnosis not present

## 2013-11-25 DIAGNOSIS — I10 Essential (primary) hypertension: Secondary | ICD-10-CM | POA: Diagnosis not present

## 2013-11-25 DIAGNOSIS — Z96649 Presence of unspecified artificial hip joint: Secondary | ICD-10-CM | POA: Diagnosis not present

## 2013-11-25 DIAGNOSIS — M129 Arthropathy, unspecified: Secondary | ICD-10-CM | POA: Diagnosis not present

## 2013-11-25 DIAGNOSIS — Z471 Aftercare following joint replacement surgery: Secondary | ICD-10-CM | POA: Diagnosis not present

## 2013-11-25 DIAGNOSIS — Z9181 History of falling: Secondary | ICD-10-CM | POA: Diagnosis not present

## 2013-11-25 DIAGNOSIS — I4891 Unspecified atrial fibrillation: Secondary | ICD-10-CM | POA: Diagnosis not present

## 2013-11-27 DIAGNOSIS — Z96649 Presence of unspecified artificial hip joint: Secondary | ICD-10-CM | POA: Diagnosis not present

## 2013-11-27 DIAGNOSIS — I10 Essential (primary) hypertension: Secondary | ICD-10-CM | POA: Diagnosis not present

## 2013-11-27 DIAGNOSIS — Z471 Aftercare following joint replacement surgery: Secondary | ICD-10-CM | POA: Diagnosis not present

## 2013-11-27 DIAGNOSIS — M129 Arthropathy, unspecified: Secondary | ICD-10-CM | POA: Diagnosis not present

## 2013-11-27 DIAGNOSIS — Z9181 History of falling: Secondary | ICD-10-CM | POA: Diagnosis not present

## 2013-11-27 DIAGNOSIS — I4891 Unspecified atrial fibrillation: Secondary | ICD-10-CM | POA: Diagnosis not present

## 2013-12-01 DIAGNOSIS — Z9181 History of falling: Secondary | ICD-10-CM | POA: Diagnosis not present

## 2013-12-01 DIAGNOSIS — Z471 Aftercare following joint replacement surgery: Secondary | ICD-10-CM | POA: Diagnosis not present

## 2013-12-01 DIAGNOSIS — M129 Arthropathy, unspecified: Secondary | ICD-10-CM | POA: Diagnosis not present

## 2013-12-01 DIAGNOSIS — Z96649 Presence of unspecified artificial hip joint: Secondary | ICD-10-CM | POA: Diagnosis not present

## 2013-12-01 DIAGNOSIS — I4891 Unspecified atrial fibrillation: Secondary | ICD-10-CM | POA: Diagnosis not present

## 2013-12-01 DIAGNOSIS — I10 Essential (primary) hypertension: Secondary | ICD-10-CM | POA: Diagnosis not present

## 2013-12-03 DIAGNOSIS — M129 Arthropathy, unspecified: Secondary | ICD-10-CM | POA: Diagnosis not present

## 2013-12-03 DIAGNOSIS — Z9181 History of falling: Secondary | ICD-10-CM | POA: Diagnosis not present

## 2013-12-03 DIAGNOSIS — Z96649 Presence of unspecified artificial hip joint: Secondary | ICD-10-CM | POA: Diagnosis not present

## 2013-12-03 DIAGNOSIS — I10 Essential (primary) hypertension: Secondary | ICD-10-CM | POA: Diagnosis not present

## 2013-12-03 DIAGNOSIS — I4891 Unspecified atrial fibrillation: Secondary | ICD-10-CM | POA: Diagnosis not present

## 2013-12-03 DIAGNOSIS — Z471 Aftercare following joint replacement surgery: Secondary | ICD-10-CM | POA: Diagnosis not present

## 2013-12-08 DIAGNOSIS — Z9181 History of falling: Secondary | ICD-10-CM | POA: Diagnosis not present

## 2013-12-08 DIAGNOSIS — M129 Arthropathy, unspecified: Secondary | ICD-10-CM | POA: Diagnosis not present

## 2013-12-08 DIAGNOSIS — Z471 Aftercare following joint replacement surgery: Secondary | ICD-10-CM | POA: Diagnosis not present

## 2013-12-08 DIAGNOSIS — I10 Essential (primary) hypertension: Secondary | ICD-10-CM | POA: Diagnosis not present

## 2013-12-08 DIAGNOSIS — Z96649 Presence of unspecified artificial hip joint: Secondary | ICD-10-CM | POA: Diagnosis not present

## 2013-12-08 DIAGNOSIS — I4891 Unspecified atrial fibrillation: Secondary | ICD-10-CM | POA: Diagnosis not present

## 2013-12-09 DIAGNOSIS — I10 Essential (primary) hypertension: Secondary | ICD-10-CM | POA: Diagnosis not present

## 2013-12-09 DIAGNOSIS — I4891 Unspecified atrial fibrillation: Secondary | ICD-10-CM | POA: Diagnosis not present

## 2013-12-09 DIAGNOSIS — Z471 Aftercare following joint replacement surgery: Secondary | ICD-10-CM | POA: Diagnosis not present

## 2013-12-09 DIAGNOSIS — Z96649 Presence of unspecified artificial hip joint: Secondary | ICD-10-CM | POA: Diagnosis not present

## 2013-12-09 DIAGNOSIS — M129 Arthropathy, unspecified: Secondary | ICD-10-CM | POA: Diagnosis not present

## 2013-12-09 DIAGNOSIS — Z9181 History of falling: Secondary | ICD-10-CM | POA: Diagnosis not present

## 2013-12-11 DIAGNOSIS — Z471 Aftercare following joint replacement surgery: Secondary | ICD-10-CM | POA: Diagnosis not present

## 2013-12-11 DIAGNOSIS — I4891 Unspecified atrial fibrillation: Secondary | ICD-10-CM | POA: Diagnosis not present

## 2013-12-11 DIAGNOSIS — I10 Essential (primary) hypertension: Secondary | ICD-10-CM | POA: Diagnosis not present

## 2013-12-11 DIAGNOSIS — Z96649 Presence of unspecified artificial hip joint: Secondary | ICD-10-CM | POA: Diagnosis not present

## 2013-12-11 DIAGNOSIS — M129 Arthropathy, unspecified: Secondary | ICD-10-CM | POA: Diagnosis not present

## 2013-12-11 DIAGNOSIS — Z9181 History of falling: Secondary | ICD-10-CM | POA: Diagnosis not present

## 2013-12-29 DIAGNOSIS — E039 Hypothyroidism, unspecified: Secondary | ICD-10-CM | POA: Diagnosis not present

## 2013-12-29 DIAGNOSIS — M1990 Unspecified osteoarthritis, unspecified site: Secondary | ICD-10-CM | POA: Diagnosis not present

## 2013-12-29 DIAGNOSIS — I4891 Unspecified atrial fibrillation: Secondary | ICD-10-CM | POA: Diagnosis not present

## 2013-12-29 DIAGNOSIS — I10 Essential (primary) hypertension: Secondary | ICD-10-CM | POA: Diagnosis not present

## 2014-01-01 DIAGNOSIS — M25559 Pain in unspecified hip: Secondary | ICD-10-CM | POA: Diagnosis not present

## 2014-02-23 ENCOUNTER — Encounter: Payer: Self-pay | Admitting: Cardiovascular Disease

## 2014-02-23 ENCOUNTER — Ambulatory Visit (INDEPENDENT_AMBULATORY_CARE_PROVIDER_SITE_OTHER): Payer: Medicare Other | Admitting: Cardiovascular Disease

## 2014-02-23 VITALS — BP 120/80 | HR 59 | Ht 65.0 in | Wt 124.0 lb

## 2014-02-23 DIAGNOSIS — I10 Essential (primary) hypertension: Secondary | ICD-10-CM

## 2014-02-23 DIAGNOSIS — I359 Nonrheumatic aortic valve disorder, unspecified: Secondary | ICD-10-CM

## 2014-02-23 DIAGNOSIS — I4891 Unspecified atrial fibrillation: Secondary | ICD-10-CM

## 2014-02-23 DIAGNOSIS — I350 Nonrheumatic aortic (valve) stenosis: Secondary | ICD-10-CM

## 2014-02-23 NOTE — Patient Instructions (Signed)
There were no changes to your medications. Continue as directed. Your physician wants you to follow up in: 6 months.  You will receive a reminder letter in the mail one-two months in advance.  If you don't receive a letter, please call our office to schedule the follow up appointment

## 2014-02-23 NOTE — Progress Notes (Signed)
Patient ID: Regina [name], female   DOB: 03/18/1923, 80 y.o.   MRN: [id number]      SUBJECTIVE: The patient is an 80 year old woman with a history of atrial fibrillation, hypertension, hypothyroidism and CVA.  She presently denies chest pain, shortness of breath, lightheadedness, dizziness, and leg swelling. She underwent left hip replacement surgery earlier this year and ambulates using a walker.  She is here with her daughter-in-law who lives next door. An echocardiogram performed in April 2014 showed normal left ventricular systolic function with an ejection fraction of 60% and very mild aortic stenosis, along with mitral and tricuspid valvular regurgitation.  She underwent a normal nuclear stress test on 08/26/2013. ECG performed in the office today demonstrates atrial flutter with variable conduction, heart rate 60 beats per minute.  Review of Systems: As per "subjective", otherwise negative.  No Known Allergies  Current Outpatient Prescriptions  Medication Sig Dispense Refill  . acetaminophen (TYLENOL) 650 MG CR tablet Take 650 mg by mouth every 8 (eight) hours as needed for pain.      Marland Kitchen apixaban (ELIQUIS) 2.5 MG TABS tablet Take 1 tablet (2.5 mg total) by mouth 2 (two) times daily.  60 tablet  6  . diltiazem (CARDIZEM CD) 180 MG 24 hr capsule Take 180 mg by mouth every morning.       Marland Kitchen levothyroxine (SYNTHROID, LEVOTHROID) 100 MCG tablet Take 100 mcg by mouth daily before breakfast.      . magnesium hydroxide (MILK OF MAGNESIA) 400 MG/5ML suspension Take 30 mLs by mouth daily as needed for constipation.       . metoprolol tartrate (LOPRESSOR) 25 MG tablet Take 1 tablet (25 mg total) by mouth 2 (two) times daily.  180 tablet  1   No current facility-administered medications for this visit.    Past Medical History  Diagnosis Date  . Hypertension   . Hypothyroidism   . Stroke 2003  . Arthritis   . History of blood transfusion   . Anemia   . Dysrhythmia 4/14    atrial fib  post op  . H/O hiatal hernia   . Avascular necrosis of bone of left hip     Past Surgical History  Procedure Laterality Date  . Eye surgery    . Mouth sugery      Upper teeth impacted  . Hip pinning,cannulated Left 09/13/2012    Procedure: ASNIS HIP PINNING LEFT;  Surgeon: Sanjuana Kava, MD;  Location: [facility];  Service: Orthopedics;  Laterality: Left;  . Total hip arthroplasty Left 10/03/2013    Procedure: LEFT TOTAL HIP ARTHROPLASTY ANTERIOR APPROACH and REMOVAL OF SCREWS LEFT HIP;  Surgeon: Mcarthur Rossetti, MD;  Location: [facility];  Service: Orthopedics;  Laterality: Left;    History   Social History  . Marital Status: Widowed    Spouse Name: N/A    Number of Children: N/A  . Years of Education: N/A   Occupational History  . Not on file.   Social History Main Topics  . Smoking status: Never Smoker   . Smokeless tobacco: Never Used     Comment: Smoked a few times in college (socially)  . Alcohol Use: No     Comment: None since Christmas 2013  . Drug Use: No  . Sexual Activity: Not on file   Other Topics Concern  . Not on file   Social History Narrative  . No narrative on file     Filed Vitals:   02/23/14 1509  BP: 120/80  Pulse: 59  Height: 5\' 5"  (1.651 m)  Weight: 124 lb (56.246 kg)  SpO2: 94%    PHYSICAL EXAM General: NAD  Neck: No JVD, no thyromegaly or thyroid nodule.  Lungs: Clear to auscultation bilaterally with normal respiratory effort.  CV: Nondisplaced PMI. Heart irregular rhythm, normal rate, normal S1/S2, no S3, II/VI ejection systolic murmur over RUSB. No peripheral edema. No carotid bruit. Normal pedal pulses.  Abdomen: Soft, nontender, no hepatosplenomegaly, no distention.  Neurologic: Alert and oriented x 3.  Psych: Normal affect.  Skin: Normal. Musculoskeletal: Normal range of motion, no gross deformities. Extremities: No clubbing or cyanosis.   ECG: Most recent ECG reviewed.      ASSESSMENT AND PLAN: 1. Atrial  fibrillation/flutter: Rate is controlled. No bleeding problems with Eliquis. 2. Essential HTN: Controlled on present therapy. 3. Mild aortic stenosis: Stable.  Dispo: f/u 6 months.  Kate Sable, M.D., F.A.C.C.

## 2014-03-25 DIAGNOSIS — M1990 Unspecified osteoarthritis, unspecified site: Secondary | ICD-10-CM | POA: Diagnosis not present

## 2014-03-25 DIAGNOSIS — E039 Hypothyroidism, unspecified: Secondary | ICD-10-CM | POA: Diagnosis not present

## 2014-03-25 DIAGNOSIS — I10 Essential (primary) hypertension: Secondary | ICD-10-CM | POA: Diagnosis not present

## 2014-03-25 DIAGNOSIS — I4891 Unspecified atrial fibrillation: Secondary | ICD-10-CM | POA: Diagnosis not present

## 2014-03-25 DIAGNOSIS — Z79899 Other long term (current) drug therapy: Secondary | ICD-10-CM | POA: Diagnosis not present

## 2014-04-01 DIAGNOSIS — S72009A Fracture of unspecified part of neck of unspecified femur, initial encounter for closed fracture: Secondary | ICD-10-CM | POA: Diagnosis not present

## 2014-04-01 DIAGNOSIS — Z23 Encounter for immunization: Secondary | ICD-10-CM | POA: Diagnosis not present

## 2014-04-01 DIAGNOSIS — I10 Essential (primary) hypertension: Secondary | ICD-10-CM | POA: Diagnosis not present

## 2014-04-01 DIAGNOSIS — I6789 Other cerebrovascular disease: Secondary | ICD-10-CM | POA: Diagnosis not present

## 2014-04-02 DIAGNOSIS — Z96642 Presence of left artificial hip joint: Secondary | ICD-10-CM | POA: Diagnosis not present

## 2014-06-16 ENCOUNTER — Other Ambulatory Visit: Payer: Self-pay

## 2014-06-16 MED ORDER — METOPROLOL TARTRATE 25 MG PO TABS: 25.0000 mg | ORAL_TABLET | Freq: Two times a day (BID) | ORAL | Status: DC

## 2014-07-21 DIAGNOSIS — B029 Zoster without complications: Secondary | ICD-10-CM | POA: Diagnosis not present

## 2014-07-21 DIAGNOSIS — I482 Chronic atrial fibrillation: Secondary | ICD-10-CM | POA: Diagnosis not present

## 2014-08-03 DIAGNOSIS — S72009A Fracture of unspecified part of neck of unspecified femur, initial encounter for closed fracture: Secondary | ICD-10-CM | POA: Diagnosis not present

## 2014-08-03 DIAGNOSIS — I10 Essential (primary) hypertension: Secondary | ICD-10-CM | POA: Diagnosis not present

## 2014-08-03 DIAGNOSIS — I482 Chronic atrial fibrillation: Secondary | ICD-10-CM | POA: Diagnosis not present

## 2014-08-03 DIAGNOSIS — R21 Rash and other nonspecific skin eruption: Secondary | ICD-10-CM | POA: Diagnosis not present

## 2014-10-07 ENCOUNTER — Ambulatory Visit: Payer: Medicare Other | Admitting: Cardiovascular Disease

## 2014-10-30 ENCOUNTER — Ambulatory Visit (INDEPENDENT_AMBULATORY_CARE_PROVIDER_SITE_OTHER): Payer: Medicare Other | Admitting: Cardiovascular Disease

## 2014-10-30 VITALS — BP 118/68 | HR 57 | Ht 65.0 in | Wt 126.0 lb

## 2014-10-30 DIAGNOSIS — I4891 Unspecified atrial fibrillation: Secondary | ICD-10-CM | POA: Diagnosis not present

## 2014-10-30 DIAGNOSIS — I10 Essential (primary) hypertension: Secondary | ICD-10-CM | POA: Diagnosis not present

## 2014-10-30 DIAGNOSIS — I350 Nonrheumatic aortic (valve) stenosis: Secondary | ICD-10-CM | POA: Diagnosis not present

## 2014-10-30 NOTE — Progress Notes (Signed)
Patient ID: Regina [name], female   DOB: 1922/10/01, 81 y.o.   MRN: [id number]      SUBJECTIVE: The patient is an 81 year old woman with a history of atrial fibrillation, hypertension, hypothyroidism and CVA.   She presently denies chest pain, shortness of breath, lightheadedness, dizziness, and leg swelling. She underwent left hip replacement surgery in 2015 and ambulates using a walker. She does not feel like the hip replacement helped.  She is here with her daughter-in-law who lives next door.  An echocardiogram performed in April 2014 showed normal left ventricular systolic function with an ejection fraction of 60% and very mild aortic stenosis, along with mitral and tricuspid valvular regurgitation.   She underwent a normal nuclear stress test on 08/26/2013.   Review of Systems: As per "subjective", otherwise negative.  No Known Allergies  Current Outpatient Prescriptions  Medication Sig Dispense Refill  . acetaminophen (TYLENOL) 650 MG CR tablet Take 650 mg by mouth every 8 (eight) hours as needed for pain.    Marland Kitchen apixaban (ELIQUIS) 2.5 MG TABS tablet Take 1 tablet (2.5 mg total) by mouth 2 (two) times daily. 60 tablet 6  . diltiazem (CARDIZEM CD) 180 MG 24 hr capsule Take 180 mg by mouth every morning.     Marland Kitchen levothyroxine (SYNTHROID, LEVOTHROID) 100 MCG tablet Take 100 mcg by mouth daily before breakfast.    . magnesium hydroxide (MILK OF MAGNESIA) 400 MG/5ML suspension Take 30 mLs by mouth daily as needed for constipation.     . metoprolol tartrate (LOPRESSOR) 25 MG tablet Take 1 tablet (25 mg total) by mouth 2 (two) times daily. 180 tablet 3   No current facility-administered medications for this visit.    Past Medical History  Diagnosis Date  . Hypertension   . Hypothyroidism   . Stroke 2003  . Arthritis   . History of blood transfusion   . Anemia   . Dysrhythmia 4/14    atrial fib post op  . H/O hiatal hernia   . Avascular necrosis of bone of left hip      Past Surgical History  Procedure Laterality Date  . Eye surgery    . Mouth sugery      Upper teeth impacted  . Hip pinning,cannulated Left 09/13/2012    Procedure: ASNIS HIP PINNING LEFT;  Surgeon: Sanjuana Kava, MD;  Location: [facility];  Service: Orthopedics;  Laterality: Left;  . Total hip arthroplasty Left 10/03/2013    Procedure: LEFT TOTAL HIP ARTHROPLASTY ANTERIOR APPROACH and REMOVAL OF SCREWS LEFT HIP;  Surgeon: Mcarthur Rossetti, MD;  Location: [facility];  Service: Orthopedics;  Laterality: Left;    History   Social History  . Marital Status: Widowed    Spouse Name: N/A  . Number of Children: N/A  . Years of Education: N/A   Occupational History  . Not on file.   Social History Main Topics  . Smoking status: Never Smoker   . Smokeless tobacco: Never Used     Comment: Smoked a few times in college (socially)  . Alcohol Use: No     Comment: None since Christmas 2013  . Drug Use: No  . Sexual Activity: Not on file   Other Topics Concern  . Not on file   Social History Narrative  . No narrative on file     Filed Vitals:   10/30/14 1056  BP: 118/68  Pulse: 57  Height: 5\' 5"  (1.651 m)  Weight: 126 lb (57.153 kg)  SpO2: 91%  PHYSICAL EXAM General: NAD  Neck: No JVD, no thyromegaly or thyroid nodule.  Lungs: Clear to auscultation bilaterally with normal respiratory effort.  CV: Nondisplaced PMI. Heart irregular rhythm, normal rate, normal S1/S2, no S3, II/VI ejection systolic murmur over RUSB. No peripheral edema. No carotid bruits. Abdomen: Soft, nontender, no distention.  Neurologic: Alert and oriented x 3.  Psych: Normal affect.  Skin: Normal. Musculoskeletal: No gross deformities.    ECG: Most recent ECG reviewed.      ASSESSMENT AND PLAN: 1. Atrial fibrillation/flutter: Symptomatically stable. Heart rate is controlled. No bleeding problems with Eliquis. No changes to therapy which includes diltiazem 180 mg daily (long-acting) and  metoprolol.  2. Essential HTN: Controlled on present therapy. No changes.  3. Mild aortic stenosis: Stable.  Dispo: f/u 1 year.  Kate Sable, M.D., F.A.C.C.

## 2014-10-30 NOTE — Patient Instructions (Signed)
Your physician wants you to follow-up in: 1 year with Dr Koneswaran You will receive a reminder letter in the mail two months in advance. If you don't receive a letter, please call our office to schedule the follow-up appointment.    Your physician recommends that you continue on your current medications as directed. Please refer to the Current Medication list given to you today.     Thank you for choosing [facility] !        

## 2014-10-30 NOTE — Addendum Note (Signed)
Addended by: Barbarann Ehlers A on: 10/30/2014 11:18 AM   Modules accepted: Level of Service

## 2014-12-02 DIAGNOSIS — I482 Chronic atrial fibrillation: Secondary | ICD-10-CM | POA: Diagnosis not present

## 2014-12-02 DIAGNOSIS — I6789 Other cerebrovascular disease: Secondary | ICD-10-CM | POA: Diagnosis not present

## 2014-12-02 DIAGNOSIS — I10 Essential (primary) hypertension: Secondary | ICD-10-CM | POA: Diagnosis not present

## 2014-12-02 DIAGNOSIS — S72009B Fracture of unspecified part of neck of unspecified femur, initial encounter for open fracture type I or II: Secondary | ICD-10-CM | POA: Diagnosis not present

## 2015-03-24 DIAGNOSIS — S72009B Fracture of unspecified part of neck of unspecified femur, initial encounter for open fracture type I or II: Secondary | ICD-10-CM | POA: Diagnosis not present

## 2015-03-24 DIAGNOSIS — I10 Essential (primary) hypertension: Secondary | ICD-10-CM | POA: Diagnosis not present

## 2015-03-24 DIAGNOSIS — E119 Type 2 diabetes mellitus without complications: Secondary | ICD-10-CM | POA: Diagnosis not present

## 2015-03-24 DIAGNOSIS — I482 Chronic atrial fibrillation: Secondary | ICD-10-CM | POA: Diagnosis not present

## 2015-03-31 DIAGNOSIS — I10 Essential (primary) hypertension: Secondary | ICD-10-CM | POA: Diagnosis not present

## 2015-03-31 DIAGNOSIS — I480 Paroxysmal atrial fibrillation: Secondary | ICD-10-CM | POA: Diagnosis not present

## 2015-03-31 DIAGNOSIS — I6789 Other cerebrovascular disease: Secondary | ICD-10-CM | POA: Diagnosis not present

## 2015-03-31 DIAGNOSIS — Z23 Encounter for immunization: Secondary | ICD-10-CM | POA: Diagnosis not present

## 2015-03-31 DIAGNOSIS — R5383 Other fatigue: Secondary | ICD-10-CM | POA: Diagnosis not present

## 2015-04-12 DIAGNOSIS — T1490 Injury, unspecified: Secondary | ICD-10-CM | POA: Diagnosis not present

## 2015-04-12 DIAGNOSIS — X32XXXA Exposure to sunlight, initial encounter: Secondary | ICD-10-CM | POA: Diagnosis not present

## 2015-04-12 DIAGNOSIS — L570 Actinic keratosis: Secondary | ICD-10-CM | POA: Diagnosis not present

## 2015-04-18 DIAGNOSIS — R531 Weakness: Secondary | ICD-10-CM | POA: Diagnosis not present

## 2015-04-18 DIAGNOSIS — R404 Transient alteration of awareness: Secondary | ICD-10-CM | POA: Diagnosis not present

## 2015-07-28 DIAGNOSIS — I10 Essential (primary) hypertension: Secondary | ICD-10-CM | POA: Diagnosis not present

## 2015-07-28 DIAGNOSIS — R5383 Other fatigue: Secondary | ICD-10-CM | POA: Diagnosis not present

## 2015-08-02 ENCOUNTER — Other Ambulatory Visit (HOSPITAL_COMMUNITY): Payer: Self-pay | Admitting: Pulmonary Disease

## 2015-08-02 DIAGNOSIS — R945 Abnormal results of liver function studies: Principal | ICD-10-CM

## 2015-08-02 DIAGNOSIS — I10 Essential (primary) hypertension: Secondary | ICD-10-CM | POA: Diagnosis not present

## 2015-08-02 DIAGNOSIS — I482 Chronic atrial fibrillation: Secondary | ICD-10-CM | POA: Diagnosis not present

## 2015-08-02 DIAGNOSIS — R7989 Other specified abnormal findings of blood chemistry: Secondary | ICD-10-CM

## 2015-08-02 DIAGNOSIS — I6789 Other cerebrovascular disease: Secondary | ICD-10-CM | POA: Diagnosis not present

## 2015-08-05 ENCOUNTER — Ambulatory Visit (HOSPITAL_COMMUNITY)
Admission: RE | Admit: 2015-08-05 | Discharge: 2015-08-05 | Disposition: A | Discharge status: Home / Self Care | Payer: Medicare Other | Source: Ambulatory Visit | Attending: Pulmonary Disease | Admitting: Pulmonary Disease

## 2015-08-05 DIAGNOSIS — I482 Chronic atrial fibrillation: Secondary | ICD-10-CM | POA: Diagnosis not present

## 2015-08-05 DIAGNOSIS — R7989 Other specified abnormal findings of blood chemistry: Secondary | ICD-10-CM

## 2015-08-05 DIAGNOSIS — I6789 Other cerebrovascular disease: Secondary | ICD-10-CM | POA: Diagnosis not present

## 2015-08-05 DIAGNOSIS — K8020 Calculus of gallbladder without cholecystitis without obstruction: Secondary | ICD-10-CM | POA: Insufficient documentation

## 2015-08-05 DIAGNOSIS — R945 Abnormal results of liver function studies: Secondary | ICD-10-CM | POA: Insufficient documentation

## 2015-08-05 DIAGNOSIS — I10 Essential (primary) hypertension: Secondary | ICD-10-CM | POA: Diagnosis not present

## 2015-08-13 ENCOUNTER — Other Ambulatory Visit: Payer: Self-pay | Admitting: Cardiovascular Disease

## 2015-08-16 ENCOUNTER — Encounter (INDEPENDENT_AMBULATORY_CARE_PROVIDER_SITE_OTHER): Payer: Self-pay | Admitting: Internal Medicine

## 2015-08-16 ENCOUNTER — Ambulatory Visit (INDEPENDENT_AMBULATORY_CARE_PROVIDER_SITE_OTHER): Payer: Medicare Other | Admitting: Internal Medicine

## 2015-08-16 VITALS — BP 118/70 | HR 64 | Temp 97.0°F | Resp 18 | Ht 64.0 in | Wt 130.9 lb

## 2015-08-16 DIAGNOSIS — K8050 Calculus of bile duct without cholangitis or cholecystitis without obstruction: Secondary | ICD-10-CM

## 2015-08-16 NOTE — Patient Instructions (Signed)
Physician will call with results of blood tests when completed. Notify if you have upper abdominal or back pain

## 2015-08-17 NOTE — Progress Notes (Signed)
Reason for Consultation:    Choledocholithiasis.  HPI:   Patient is 82 year old Caucasian female who is referred through courtesy of Dr. Luan Pulling for further evaluation of choledocholithiasis noted on recent ultrasonography. Patient is accompanied by her daughter-in-law Benjamine Mola. Patient states she experienced infrascapular pain with nausea and vomiting Sunday before last Christmas. She called 911. By the time help arrived she was pain-free. Her vital signs were normal. She was therefore advised to follow-up with Dr. Luan Pulling which she did not until recently. However she has not had any more episodes of pain. She does not recall that she has had this pain in the past but according to Dr. Luan Pulling she has had at least 2 episodes in the past when she had ultrasound. She saw Dr. Luan Pulling over 2 weeks ago for routine visit and was noted to have elevated transaminases and therefore ultrasound was obtained which revealed cholelithiasis mildly dilated bile duct containing stones. She has good appetite. She denies nausea vomiting heartburn or dysphagia. She recalls she had single episode of melena a few months ago but not thereafter. She denies abdominal pain diarrhea or constipation. She has chronic left hip pain and uses walker and/or wheelchair to ambulate. She denies fever chills or night sweats. She is widowed and lives alone but her son and daughter-in-law live next door. She is able to do some household work.   Current Outpatient Prescriptions  Medication Sig Dispense Refill  . acetaminophen (TYLENOL) 650 MG CR tablet Take 650 mg by mouth every 8 (eight) hours as needed for pain.    Marland Kitchen apixaban (ELIQUIS) 2.5 MG TABS tablet Take 1 tablet (2.5 mg total) by mouth 2 (two) times daily. 60 tablet 6  . diltiazem (CARDIZEM CD) 180 MG 24 hr capsule Take 180 mg by mouth every morning.     Marland Kitchen levothyroxine (SYNTHROID, LEVOTHROID) 100 MCG tablet Take 100 mcg by mouth daily before breakfast.    . metoprolol  tartrate (LOPRESSOR) 25 MG tablet take 1 tablet by mouth twice a day 180 tablet 0  . mupirocin ointment (BACTROBAN) 2 % apply to affected area twice a day  0   No current facility-administered medications for this visit.    Past Medical History  Diagnosis Date  . Hypertension   . Hypothyroidism   . Stroke [facility]) 2003  . Arthritis   . History of blood transfusion   . Anemia   . Dysrhythmia 4/14    atrial fib post op  . H/O hiatal hernia   . Avascular necrosis of bone of left hip [facility])     Past Surgical History  Procedure Laterality Date  . Eye surgery    . Mouth sugery      Upper teeth impacted  . Hip pinning,cannulated Left 09/13/2012    Procedure: ASNIS HIP PINNING LEFT;  Surgeon: Sanjuana Kava, MD;  Location: [facility];  Service: Orthopedics;  Laterality: Left;  . Total hip arthroplasty Left 10/03/2013    Procedure: LEFT TOTAL HIP ARTHROPLASTY ANTERIOR APPROACH and REMOVAL OF SCREWS LEFT HIP;  Surgeon: Mcarthur Rossetti, MD;  Location: [facility];  Service: Orthopedics;  Laterality: Left;     Allergies as of 08/16/2015  . (No Known Allergies)    Physical Exam:   Blood pressure 118/70, pulse 64, temperature 97 F (36.1 C), temperature source Oral, resp. rate 18, height 5\' 4"  (1.626 m), weight 130 lb 14.4 oz (59.376 kg). Patient is alert and in no acute distress. Conjunctiva is pink. Sclerae nonicteric. Oropharyngeal mucosa is normal. Cardiac  exam with regular rhythm normal S1 and S2. She has faint systolic ejection murmur heard at aortic area as well as left sternal border. Lungs are clear to auscultation. Abdomen is symmetrical soft and nontender without organomegaly or masses. No peripheral edema or clubbing noted.   Lab Results: Lab data from 07/28/2015 WBC 9.5, H&H 14.3 and 42.6 and platelet count 209K Bilirubin 0.5, AP 153, AST 51, ALT 76, total protein 7.2 and albumin 4.4. Serum calcium 9.5 Serum sodium 141, potassium 4.8, chloride 100 CO2 26, BUN 15 and  creatinine 0.89 Glucose 81.  Studies/Results:  Ultrasound on 06/14/2007 revealed cholelithiasis and mildly dilated bile duct measuring 8 mm and there was a question of echogenic focus in the bile duct.  Ultrasound on 09/10/2012 revealed cholelithiasis, dilated intra-and extrahepatic biliary system. CBD measured 16 mm there was suggestion of stone in distal CBD.  Ultrasound from 08/05/2015 reveals cholelithiasis, mild gallbladder wall thickening measuring 3.2 mm. CBD measured 9 mm with echogenic foci consistent with stones.   Assessment;  Patient has well documented choledocholithiasis possibly dating back to 2014. She has had relatively mild symptoms. She is at risk for biliary pancreatitis and cholangitis. She will benefit from therapeutic ERCP. She is deemed to be somewhat high risk for anesthesia.  Recommendations;  Patient will go to the lab for LFTs. Further recommendations to follow.   REHMAN,NAJEEB U  ,

## 2015-08-23 DIAGNOSIS — K8050 Calculus of bile duct without cholangitis or cholecystitis without obstruction: Secondary | ICD-10-CM | POA: Diagnosis not present

## 2015-11-02 ENCOUNTER — Ambulatory Visit: Payer: Self-pay | Admitting: Cardiovascular Disease

## 2015-11-08 ENCOUNTER — Encounter: Payer: Self-pay | Admitting: Cardiovascular Disease

## 2015-11-08 ENCOUNTER — Ambulatory Visit (INDEPENDENT_AMBULATORY_CARE_PROVIDER_SITE_OTHER): Payer: Medicare Other | Admitting: Cardiovascular Disease

## 2015-11-08 VITALS — BP 134/78 | HR 52 | Ht 65.0 in | Wt 131.0 lb

## 2015-11-08 DIAGNOSIS — I4891 Unspecified atrial fibrillation: Secondary | ICD-10-CM

## 2015-11-08 DIAGNOSIS — I10 Essential (primary) hypertension: Secondary | ICD-10-CM

## 2015-11-08 DIAGNOSIS — I350 Nonrheumatic aortic (valve) stenosis: Secondary | ICD-10-CM

## 2015-11-08 DIAGNOSIS — R001 Bradycardia, unspecified: Secondary | ICD-10-CM | POA: Diagnosis not present

## 2015-11-08 MED ORDER — DILTIAZEM HCL ER COATED BEADS 120 MG PO CP24: 120.0000 mg | ORAL_CAPSULE | Freq: Every day | ORAL | Status: DC

## 2015-11-08 NOTE — Progress Notes (Signed)
Patient ID: Regina [name], female   DOB: [birth year], 82 y.o.   MRN: [id number]      SUBJECTIVE: The patient presents for routine follow up. She has a history of atrial fibrillation, hypertension, hypothyroidism and CVA.   She presently denies chest pain, shortness of breath, lightheadedness, and dizziness. She has had some mild leg swelling. She underwent left hip replacement surgery in 2015 and ambulates using a walker. She does not feel like the hip replacement helped.  She is here with her daughter-in-law who lives next door.  An echocardiogram performed in April 2014 showed normal left ventricular systolic function with an ejection fraction of 60% and very mild aortic stenosis, along with mitral and tricuspid valvular regurgitation.   She underwent a normal nuclear stress test on 08/26/2013.  ECG performed in the office today demonstrates variable atrial flutter, heart rate in the 50 beat per minute range.  Review of Systems: As per "subjective", otherwise negative.  No Known Allergies  Current Outpatient Prescriptions  Medication Sig Dispense Refill  . acetaminophen (TYLENOL) 650 MG CR tablet Take 650 mg by mouth every 8 (eight) hours as needed for pain.    Marland Kitchen apixaban (ELIQUIS) 2.5 MG TABS tablet Take 1 tablet (2.5 mg total) by mouth 2 (two) times daily. 60 tablet 6  . diltiazem (CARDIZEM CD) 180 MG 24 hr capsule Take 180 mg by mouth every morning.     Marland Kitchen levothyroxine (SYNTHROID, LEVOTHROID) 100 MCG tablet Take 100 mcg by mouth daily before breakfast.    . metoprolol tartrate (LOPRESSOR) 25 MG tablet take 1 tablet by mouth twice a day 180 tablet 0  . mupirocin ointment (BACTROBAN) 2 % apply to affected area twice a day  0   No current facility-administered medications for this visit.    Past Medical History  Diagnosis Date  . Hypertension   . Hypothyroidism   . Stroke [facility]) 2003  . Arthritis   . History of blood transfusion   . Anemia   . Dysrhythmia 4/14    atrial  fib post op  . H/O hiatal hernia   . Avascular necrosis of bone of left hip [facility])     Past Surgical History  Procedure Laterality Date  . Eye surgery    . Mouth sugery      Upper teeth impacted  . Hip pinning,cannulated Left 09/13/2012    Procedure: ASNIS HIP PINNING LEFT;  Surgeon: Sanjuana Kava, MD;  Location: [facility];  Service: Orthopedics;  Laterality: Left;  . Total hip arthroplasty Left 10/03/2013    Procedure: LEFT TOTAL HIP ARTHROPLASTY ANTERIOR APPROACH and REMOVAL OF SCREWS LEFT HIP;  Surgeon: Mcarthur Rossetti, MD;  Location: [facility];  Service: Orthopedics;  Laterality: Left;    Social History   Social History  . Marital Status: Widowed    Spouse Name: N/A  . Number of Children: N/A  . Years of Education: N/A   Occupational History  . Not on file.   Social History Main Topics  . Smoking status: Never Smoker   . Smokeless tobacco: Never Used     Comment: Smoked a few times in college (socially)  . Alcohol Use: No     Comment: None since Christmas 2013  . Drug Use: No  . Sexual Activity: Not on file   Other Topics Concern  . Not on file   Social History Narrative     Filed Vitals:   11/08/15 1334  BP: 134/78  Pulse: 52  Height: 5\' 5"  (  1.651 m)  Weight: 131 lb (59.421 kg)  SpO2: 94%    PHYSICAL EXAM General: NAD  Neck: No JVD, no thyromegaly or thyroid nodule.  Lungs: Clear to auscultation bilaterally with normal respiratory effort.  CV: Nondisplaced PMI. Heart irregular rhythm, normal rate, normal S1/S2, no S3, I/VI ejection systolic murmur over RUSB. No peripheral edema.  Abdomen: Soft, nontender, no distention.  Neurologic: Alert and oriented x 3.  Psych: Normal affect.  Skin: Normal. Musculoskeletal: No gross deformities.   ECG: Most recent ECG reviewed.      ASSESSMENT AND PLAN: 1. Atrial fibrillation/flutter: Symptomatically stable. Heart rate is controlled but bradycardic. I will reduce diltiazem to 120 mg daily. No bleeding  problems with Eliquis. Continue metoprolol.  2. Essential HTN: Controlled on present therapy. No changes.  3. Mild aortic stenosis: Stable.  Dispo: f/u 1 year.   Kate Sable, M.D., F.A.C.C.

## 2015-11-08 NOTE — Patient Instructions (Signed)
Your physician wants you to follow-up in:1 year   You will receive a reminder letter in the mail two months in advance. If you don't receive a letter, please call our office to schedule the follow-up appointment.     DECREASE Diltiazem to 120 mg daily    If you need a refill on your cardiac medications before your next appointment, please call your pharmacy.     Thank you for choosing [facility] !

## 2015-11-30 DIAGNOSIS — I6789 Other cerebrovascular disease: Secondary | ICD-10-CM | POA: Diagnosis not present

## 2015-11-30 DIAGNOSIS — M84359A Stress fracture, hip, unspecified, initial encounter for fracture: Secondary | ICD-10-CM | POA: Diagnosis not present

## 2015-11-30 DIAGNOSIS — I482 Chronic atrial fibrillation: Secondary | ICD-10-CM | POA: Diagnosis not present

## 2015-11-30 DIAGNOSIS — I10 Essential (primary) hypertension: Secondary | ICD-10-CM | POA: Diagnosis not present

## 2015-12-02 ENCOUNTER — Other Ambulatory Visit: Payer: Self-pay | Admitting: Cardiovascular Disease

## 2015-12-04 ENCOUNTER — Other Ambulatory Visit: Payer: Self-pay | Admitting: Cardiovascular Disease

## 2015-12-24 ENCOUNTER — Encounter (INDEPENDENT_AMBULATORY_CARE_PROVIDER_SITE_OTHER): Payer: Self-pay

## 2016-01-31 ENCOUNTER — Telehealth (INDEPENDENT_AMBULATORY_CARE_PROVIDER_SITE_OTHER): Payer: Self-pay | Admitting: *Deleted

## 2016-01-31 NOTE — Telephone Encounter (Signed)
Patient daughter Benjamine Mola called -- states you saw Ms Villena back in March for liver stones and Ms Usman was to call when she was ready to have these removed -- Benjamine Mola wants to know if you need to recheck Ms Cosley or can we can procedure -- if so what kind of procedure and also you mentioned due to her age she would need to stay overnight -- please advise what I need to do

## 2016-02-01 NOTE — Telephone Encounter (Signed)
Can proceed with ERCP unless patient once to come for office visit first. She has been seen for this problem in the past.

## 2016-02-08 ENCOUNTER — Other Ambulatory Visit (INDEPENDENT_AMBULATORY_CARE_PROVIDER_SITE_OTHER): Payer: Self-pay | Admitting: Internal Medicine

## 2016-02-08 ENCOUNTER — Encounter (INDEPENDENT_AMBULATORY_CARE_PROVIDER_SITE_OTHER): Payer: Self-pay | Admitting: *Deleted

## 2016-02-08 DIAGNOSIS — K8050 Calculus of bile duct without cholangitis or cholecystitis without obstruction: Secondary | ICD-10-CM

## 2016-02-08 NOTE — Telephone Encounter (Signed)
ERCP sch'd 03/31/16 at [phone]), preop 03/27/16 at 10 am, Benjamine Mola is aware, instructions mailed

## 2016-02-10 ENCOUNTER — Telehealth (INDEPENDENT_AMBULATORY_CARE_PROVIDER_SITE_OTHER): Payer: Self-pay | Admitting: *Deleted

## 2016-02-10 NOTE — Telephone Encounter (Signed)
Per Joy with Dr Luan Pulling it is ok for patient to hold Eliquis 2 days prior to ERCP on 03/31/16 -- patient is aware

## 2016-03-23 NOTE — Patient Instructions (Signed)
Regina [name]  03/23/2016     @PREFPERIOPPHARMACY @   Your procedure is scheduled on  03/31/2016  Report to [facility] at  615  A.M.  Call this number if you have problems the morning of surgery:  [phone]   Remember:  Do not eat food or drink liquids after midnight.  Take these medicines the morning of surgery with A SIP OF WATER  Cardiazem, levothyroxine, metoprolol.  Last dose of eliquis should be 03/28/2016.   Do not wear jewelry, make-up or nail polish.  Do not wear lotions, powders, or perfumes, or deoderant.  Do not shave 48 hours prior to surgery.  Men may shave face and neck.  Do not bring valuables to the hospital.  [facility] is not responsible for any belongings or valuables.  Contacts, dentures or bridgework may not be worn into surgery.  Leave your suitcase in the car.  After surgery it may be brought to your room.  For patients admitted to the hospital, discharge time will be determined by your treatment team.  Patients discharged the day of surgery will not be allowed to drive home.   Name and phone number of your driver:   family Special instructions:  Follow the diet instructions given to you by Dr Olevia Perches office.  Please read over the following fact sheets that you were given. Anesthesia Post-op Instructions and Care and Recovery After Surgery      Endoscopic Retrograde Cholangiopancreatography (ERCP) Endoscopic retrograde cholangiopancreatography (ERCP) is a procedure used to diagnosis many diseases of the pancreas, bile ducts, liver, and gallbladder. During ERCP a thin, lighted tube (endoscope) is passed through the mouth and down the back of the throat into the first part of the small intestine (duodenum). A small, plastic tube (cannula) is then passed through the endoscope and directed into the bile duct or pancreatic duct. Dye is then injected through the cannula and X-rays are taken to study the biliary and pancreatic  passageways.  LET [facility] CARE PROVIDER KNOW ABOUT:   Any allergies you have.   All medicines you are taking, including vitamins, herbs, eyedrops, creams, and over-the-counter medicines.   Previous problems you or members of your family have had with the use of anesthetics.   Any blood disorders you have.   Previous surgeries you have had.   Medical conditions you have. RISKS AND COMPLICATIONS Generally, ERCP is a safe procedure. However, as with any procedure, complications can occur. A simple removal of gallstones has the lowest rate of complications. Higher rates of complication occur in people who have poorly functioning bile or pancreatic ducts. Possible complications include:   Pancreatitis.  Bleeding.  Accidental punctures in the bowel wall, pancreas, or gall bladder.  Gall bladder or bile duct infection. BEFORE THE PROCEDURE   Do not eat or drink anything, including water, for at least 8 hours before the procedure or as directed by your health care provider.   Ask your health care provider whether you should stop taking certain medicines prior to your procedure.   Arrange for someone to drive you home. You will not be allowed to drive for [id number] hours after the procedure. PROCEDURE   You will be given medicine through a vein (intravenously) to make you relaxed and sleepy.   You might have a breathing tube placed to give you medicine that makes you sleep (general anesthetic).   Your throat may be sprayed with medicine that  numbs the area and prevents gagging (local anesthetic), or you may gargle this medicine.   You will lie on your left side.   The endoscope will be inserted through your mouth and into the duodenum. The tube will not interfere with your breathing. Gagging is prevented by the anesthesia.   While X-rays are being taken, you may be positioned on your stomach.   A small sample of tissue (biopsy) may be removed for examination. AFTER  THE PROCEDURE   You will rest in bed until you are fully conscious.   When you first wake up, your throat may feel slightly sore.   You will not be allowed to eat or drink until numbness subsides.   Once you are able to drink, urinate, and sit on the edge of the bed without feeling sick to your stomach (nauseous) or dizzy, you may be allowed to go home.   This information is not intended to replace advice given to you by your health care provider. Make sure you discuss any questions you have with your health care provider.   Document Released: 02/14/2001 Document Revised: 03/12/2013 Document Reviewed: 12/31/2012 Elsevier Interactive Patient Education 2016 Elsevier Inc. Endoscopic Retrograde Cholangiopancreatography (ERCP), Care After Refer to this sheet in the next few weeks. These instructions provide you with information on caring for yourself after your procedure. Your health care provider may also give you more specific instructions. Your treatment has been planned according to current medical practices, but problems sometimes occur. Call your health care provider if you have any problems or questions after your procedure.  WHAT TO EXPECT AFTER THE PROCEDURE  After your procedure, it is typical to feel:   Soreness in your throat.   Sick to your stomach (nauseous).   Bloated.  Dizzy.   Fatigued. HOME CARE INSTRUCTIONS  Have a friend or family member stay with you for the first 24 hours after your procedure.  Start taking your usual medicines and eating normally as soon as you feel well enough to do so or as directed by your health care provider. SEEK MEDICAL CARE IF:  You have abdominal pain.   You develop signs of infection, such as:   Chills.   Feeling unwell.  SEEK IMMEDIATE MEDICAL CARE IF:  You have difficulty swallowing.  You have worsening throat, chest, or abdominal pain.  You vomit.  You have bloody or very black stools.  You have a fever.    This information is not intended to replace advice given to you by your health care provider. Make sure you discuss any questions you have with your health care provider.   Document Released: 03/12/2013 Document Reviewed: 03/12/2013 Elsevier Interactive Patient Education 2016 [place] Anesthesia, Adult General anesthesia is a sleep-like state of non-feeling produced by medicines (anesthetics). General anesthesia prevents you from being alert and feeling pain during a medical procedure. Your caregiver may recommend general anesthesia if your procedure:  Is long.  Is painful or uncomfortable.  Would be frightening to see or hear.  Requires you to be still.  Affects your breathing.  Causes significant blood loss. LET YOUR CAREGIVER KNOW ABOUT:  Allergies to food or medicine.  Medicines taken, including vitamins, herbs, eyedrops, over-the-counter medicines, and creams.  Use of steroids (by mouth or creams).  Previous problems with anesthetics or numbing medicines, including problems experienced by relatives.  History of bleeding problems or blood clots.  Previous surgeries and types of anesthetics received.  Possibility of pregnancy, if this applies.  Use of cigarettes, alcohol, or illegal drugs.  Any health condition(s), especially diabetes, sleep apnea, and high blood pressure. RISKS AND COMPLICATIONS General anesthesia rarely causes complications. However, if complications do occur, they can be life threatening. Complications include:  A lung infection.  A stroke.  A heart attack.  Waking up during the procedure. When this occurs, the patient may be unable to move and communicate that he or she is awake. The patient may feel severe pain. Older adults and adults with serious medical problems are more likely to have complications than adults who are young and healthy. Some complications can be prevented by answering all of your caregiver's questions  thoroughly and by following all pre-procedure instructions. It is important to tell your caregiver if any of the pre-procedure instructions, especially those related to diet, were not followed. Any food or liquid in the stomach can cause problems when you are under general anesthesia. BEFORE THE PROCEDURE  Ask your caregiver if you will have to spend the night at the hospital. If you will not have to spend the night, arrange to have an adult drive you and stay with you for 24 hours.  Follow your caregiver's instructions if you are taking dietary supplements or medicines. Your caregiver may tell you to stop taking them or to reduce your dosage.  Do not smoke for as long as possible before your procedure. If possible, stop smoking 3-6 weeks before the procedure.  Do not take new dietary supplements or medicines within 1 week of your procedure unless your caregiver approves them.  Do not eat within 8 hours of your procedure or as directed by your caregiver. Drink only clear liquids, such as water, black coffee (without milk or cream), and fruit juices (without pulp).  Do not drink within 3 hours of your procedure or as directed by your caregiver.  You may brush your teeth on the morning of the procedure, but make sure to spit out the toothpaste and water when finished. PROCEDURE  You will receive anesthetics through a mask, through an intravenous (IV) access tube, or through both. A doctor who specializes in anesthesia (anesthesiologist) or a nurse who specializes in anesthesia (nurse anesthetist) or both will stay with you throughout the procedure to make sure you remain unconscious. He or she will also watch your blood pressure, pulse, and oxygen levels to make sure that the anesthetics do not cause any problems. Once you are asleep, a breathing tube or mask may be used to help you breathe. AFTER THE PROCEDURE You will wake up after the procedure is complete. You may be in the room where the  procedure was performed or in a recovery area. You may have a sore throat if a breathing tube was used. You may also feel:  Dizzy.  Weak.  Drowsy.  Confused.  Nauseous.  Cold. These are all normal responses and can be expected to last for up to 24 hours after the procedure is complete. A caregiver will tell you when you are ready to go home. This will usually be when you are fully awake and in stable condition.   This information is not intended to replace advice given to you by your health care provider. Make sure you discuss any questions you have with your health care provider.   Document Released: 08/29/2007 Document Revised: 06/12/2014 Document Reviewed: 09/20/2011 Elsevier Interactive Patient Education 2016 [place] Anesthesia, Adult, Care After Refer to this sheet in the next few weeks. These instructions provide you  with information on caring for yourself after your procedure. Your health care provider may also give you more specific instructions. Your treatment has been planned according to current medical practices, but problems sometimes occur. Call your health care provider if you have any problems or questions after your procedure. WHAT TO EXPECT AFTER THE PROCEDURE After the procedure, it is typical to experience:  Sleepiness.  Nausea and vomiting. HOME CARE INSTRUCTIONS  For the first 24 hours after general anesthesia:  Have a responsible person with you.  Do not drive a car. If you are alone, do not take public transportation.  Do not drink alcohol.  Do not take medicine that has not been prescribed by your health care provider.  Do not sign important papers or make important decisions.  You may resume a normal diet and activities as directed by your health care provider.  Change bandages (dressings) as directed.  If you have questions or problems that seem related to general anesthesia, call the hospital and ask for the anesthetist or  anesthesiologist on call. SEEK MEDICAL CARE IF:  You have nausea and vomiting that continue the day after anesthesia.  You develop a rash. SEEK IMMEDIATE MEDICAL CARE IF:   You have difficulty breathing.  You have chest pain.  You have any allergic problems.   This information is not intended to replace advice given to you by your health care provider. Make sure you discuss any questions you have with your health care provider.   Document Released: 08/28/2000 Document Revised: 06/12/2014 Document Reviewed: 09/20/2011 Elsevier Interactive Patient Education 2016 Elsevier Inc. PATIENT INSTRUCTIONS POST-ANESTHESIA  IMMEDIATELY FOLLOWING SURGERY:  Do not drive or operate machinery for the first twenty four hours after surgery.  Do not make any important decisions for twenty four hours after surgery or while taking narcotic pain medications or sedatives.  If you develop intractable nausea and vomiting or a severe headache please notify your doctor immediately.  FOLLOW-UP:  Please make an appointment with your surgeon as instructed. You do not need to follow up with anesthesia unless specifically instructed to do so.  WOUND CARE INSTRUCTIONS (if applicable):  Keep a dry clean dressing on the anesthesia/puncture wound site if there is drainage.  Once the wound has quit draining you may leave it open to air.  Generally you should leave the bandage intact for twenty four hours unless there is drainage.  If the epidural site drains for more than 36-48 hours please call the anesthesia department.  QUESTIONS?:  Please feel free to call your physician or the hospital operator if you have any questions, and they will be happy to assist you.

## 2016-03-27 ENCOUNTER — Encounter (HOSPITAL_COMMUNITY)
Admission: RE | Admit: 2016-03-27 | Discharge: 2016-03-27 | Disposition: A | Discharge status: Home / Self Care | Payer: Medicare Other | Source: Ambulatory Visit | Attending: Internal Medicine | Admitting: Internal Medicine

## 2016-03-27 ENCOUNTER — Encounter (HOSPITAL_COMMUNITY): Payer: Self-pay

## 2016-03-27 DIAGNOSIS — I4891 Unspecified atrial fibrillation: Secondary | ICD-10-CM | POA: Diagnosis not present

## 2016-03-27 DIAGNOSIS — Z01812 Encounter for preprocedural laboratory examination: Secondary | ICD-10-CM | POA: Diagnosis not present

## 2016-03-27 DIAGNOSIS — K8050 Calculus of bile duct without cholangitis or cholecystitis without obstruction: Secondary | ICD-10-CM

## 2016-03-27 DIAGNOSIS — K5900 Constipation, unspecified: Secondary | ICD-10-CM | POA: Insufficient documentation

## 2016-03-27 DIAGNOSIS — I10 Essential (primary) hypertension: Secondary | ICD-10-CM | POA: Diagnosis not present

## 2016-03-27 DIAGNOSIS — D62 Acute posthemorrhagic anemia: Secondary | ICD-10-CM | POA: Insufficient documentation

## 2016-03-27 DIAGNOSIS — E876 Hypokalemia: Secondary | ICD-10-CM | POA: Insufficient documentation

## 2016-03-27 DIAGNOSIS — M87052 Idiopathic aseptic necrosis of left femur: Secondary | ICD-10-CM | POA: Diagnosis not present

## 2016-03-27 LAB — CBC WITH DIFFERENTIAL/PLATELET
BASOS PCT: 0 %
Basophils Absolute: 0 10*3/uL (ref 0.0–0.1)
Eosinophils Absolute: 0 10*3/uL (ref 0.0–0.7)
Eosinophils Relative: 0 %
HEMATOCRIT: 43.6 % (ref 36.0–46.0)
HEMOGLOBIN: 14.2 g/dL (ref 12.0–15.0)
Lymphocytes Relative: 8 %
Lymphs Abs: 1 10*3/uL (ref 0.7–4.0)
MCH: 29.6 pg (ref 26.0–34.0)
MCHC: 32.6 g/dL (ref 30.0–36.0)
MCV: 90.8 fL (ref 78.0–100.0)
MONOS PCT: 9 %
Monocytes Absolute: 1.1 10*3/uL — ABNORMAL HIGH (ref 0.1–1.0)
NEUTROS ABS: 9.5 10*3/uL — AB (ref 1.7–7.7)
NEUTROS PCT: 83 %
Platelets: 238 10*3/uL (ref 150–400)
RBC: 4.8 MIL/uL (ref 3.87–5.11)
RDW: 14.2 % (ref 11.5–15.5)
WBC: 11.6 10*3/uL — AB (ref 4.0–10.5)

## 2016-03-27 LAB — BASIC METABOLIC PANEL
ANION GAP: 7 (ref 5–15)
BUN: 21 mg/dL — ABNORMAL HIGH (ref 6–20)
CHLORIDE: 103 mmol/L (ref 101–111)
CO2: 27 mmol/L (ref 22–32)
CREATININE: 0.97 mg/dL (ref 0.44–1.00)
Calcium: 9.1 mg/dL (ref 8.9–10.3)
GFR calc non Af Amer: 50 mL/min — ABNORMAL LOW (ref >60)
GFR, EST AFRICAN AMERICAN: 57 mL/min — AB (ref >60)
Glucose, Bld: 164 mg/dL — ABNORMAL HIGH (ref 65–99)
POTASSIUM: 4 mmol/L (ref 3.5–5.1)
Sodium: 137 mmol/L (ref 135–145)

## 2016-03-31 ENCOUNTER — Ambulatory Visit (HOSPITAL_COMMUNITY): Payer: Medicare Other | Admitting: Anesthesiology

## 2016-03-31 ENCOUNTER — Encounter (HOSPITAL_COMMUNITY)
Admission: RE | Disposition: A | Discharge status: Home / Self Care | Payer: Self-pay | Source: Ambulatory Visit | Attending: Internal Medicine

## 2016-03-31 ENCOUNTER — Ambulatory Visit (HOSPITAL_COMMUNITY): Payer: Medicare Other

## 2016-03-31 ENCOUNTER — Encounter (HOSPITAL_COMMUNITY): Payer: Self-pay | Admitting: *Deleted

## 2016-03-31 ENCOUNTER — Ambulatory Visit (HOSPITAL_COMMUNITY)
Admission: RE | Admit: 2016-03-31 | Discharge: 2016-03-31 | Disposition: A | Discharge status: Home / Self Care | Payer: Medicare Other | Source: Ambulatory Visit | Attending: Internal Medicine | Admitting: Internal Medicine

## 2016-03-31 DIAGNOSIS — M1990 Unspecified osteoarthritis, unspecified site: Secondary | ICD-10-CM | POA: Diagnosis not present

## 2016-03-31 DIAGNOSIS — E039 Hypothyroidism, unspecified: Secondary | ICD-10-CM | POA: Insufficient documentation

## 2016-03-31 DIAGNOSIS — K8070 Calculus of gallbladder and bile duct without cholecystitis without obstruction: Secondary | ICD-10-CM | POA: Diagnosis not present

## 2016-03-31 DIAGNOSIS — Z8673 Personal history of transient ischemic attack (TIA), and cerebral infarction without residual deficits: Secondary | ICD-10-CM | POA: Diagnosis not present

## 2016-03-31 DIAGNOSIS — K449 Diaphragmatic hernia without obstruction or gangrene: Secondary | ICD-10-CM | POA: Diagnosis not present

## 2016-03-31 DIAGNOSIS — I4891 Unspecified atrial fibrillation: Secondary | ICD-10-CM | POA: Insufficient documentation

## 2016-03-31 DIAGNOSIS — K8050 Calculus of bile duct without cholangitis or cholecystitis without obstruction: Secondary | ICD-10-CM | POA: Diagnosis not present

## 2016-03-31 DIAGNOSIS — I10 Essential (primary) hypertension: Secondary | ICD-10-CM | POA: Insufficient documentation

## 2016-03-31 DIAGNOSIS — Z7901 Long term (current) use of anticoagulants: Secondary | ICD-10-CM | POA: Insufficient documentation

## 2016-03-31 HISTORY — PX: REMOVAL OF STONES: SHX5545

## 2016-03-31 HISTORY — PX: ERCP: SHX5425

## 2016-03-31 HISTORY — PX: LITHOTRIPSY: SHX5546

## 2016-03-31 HISTORY — PX: SPHINCTEROTOMY: SHX5544

## 2016-03-31 LAB — GLUCOSE, CAPILLARY: Glucose-Capillary: 114 mg/dL — ABNORMAL HIGH (ref 65–99)

## 2016-03-31 SURGERY — ERCP, WITH INTERVENTION IF INDICATED
Anesthesia: General

## 2016-03-31 MED ORDER — FENTANYL CITRATE (PF) 250 MCG/5ML IJ SOLN
INTRAMUSCULAR | Status: AC
  Filled 2016-03-31: qty 5

## 2016-03-31 MED ORDER — ONDANSETRON HCL 4 MG/2ML IJ SOLN
INTRAMUSCULAR | Status: AC
  Filled 2016-03-31: qty 2

## 2016-03-31 MED ORDER — NEOSTIGMINE METHYLSULFATE 10 MG/10ML IV SOLN
INTRAVENOUS | Status: DC | PRN
  Administered 2016-03-31: 2 mg via INTRAVENOUS

## 2016-03-31 MED ORDER — GLUCAGON HCL RDNA (DIAGNOSTIC) 1 MG IJ SOLR
INTRAMUSCULAR | Status: DC | PRN
  Administered 2016-03-31: 0.25 mg via INTRAVENOUS

## 2016-03-31 MED ORDER — ONDANSETRON HCL 4 MG/2ML IJ SOLN
4.0000 mg | Freq: Once | INTRAMUSCULAR | Status: AC
  Administered 2016-03-31: 4 mg via INTRAVENOUS

## 2016-03-31 MED ORDER — IOPAMIDOL (ISOVUE-300) INJECTION 61%
INTRAVENOUS | Status: DC | PRN
  Administered 2016-03-31: 35 mL

## 2016-03-31 MED ORDER — LACTATED RINGERS IV SOLN
INTRAVENOUS | Status: DC
  Administered 2016-03-31: 07:00:00 via INTRAVENOUS

## 2016-03-31 MED ORDER — MIDAZOLAM HCL 2 MG/2ML IJ SOLN
1.0000 mg | INTRAMUSCULAR | Status: DC | PRN
  Administered 2016-03-31: 2 mg via INTRAVENOUS

## 2016-03-31 MED ORDER — EPHEDRINE SULFATE 50 MG/ML IJ SOLN
INTRAMUSCULAR | Status: DC | PRN
  Administered 2016-03-31: 10 mg via INTRAVENOUS
  Administered 2016-03-31 (×5): 5 mg via INTRAVENOUS

## 2016-03-31 MED ORDER — GLYCOPYRROLATE 0.2 MG/ML IJ SOLN
INTRAMUSCULAR | Status: DC | PRN
  Administered 2016-03-31: .3 mg via INTRAVENOUS

## 2016-03-31 MED ORDER — FENTANYL CITRATE (PF) 100 MCG/2ML IJ SOLN
INTRAMUSCULAR | Status: DC | PRN
  Administered 2016-03-31: 125 ug via INTRAVENOUS
  Administered 2016-03-31: 25 ug via INTRAVENOUS

## 2016-03-31 MED ORDER — STERILE WATER FOR IRRIGATION IR SOLN
Status: DC | PRN
  Administered 2016-03-31: 08:00:00

## 2016-03-31 MED ORDER — IOPAMIDOL (ISOVUE-300) INJECTION 61%
INTRAVENOUS | Status: AC
  Filled 2016-03-31: qty 100

## 2016-03-31 MED ORDER — ROCURONIUM 10MG/ML (10ML) SYRINGE FOR MEDFUSION PUMP - OPTIME
INTRAVENOUS | Status: DC | PRN
  Administered 2016-03-31: 30 mg via INTRAVENOUS

## 2016-03-31 MED ORDER — ETOMIDATE 2 MG/ML IV SOLN
INTRAVENOUS | Status: DC | PRN
  Administered 2016-03-31: 8 mg via INTRAVENOUS

## 2016-03-31 MED ORDER — CHLORHEXIDINE GLUCONATE CLOTH 2 % EX PADS: 6.0000 | MEDICATED_PAD | Freq: Once | CUTANEOUS | Status: DC

## 2016-03-31 MED ORDER — MIDAZOLAM HCL 2 MG/2ML IJ SOLN
INTRAMUSCULAR | Status: AC
  Filled 2016-03-31: qty 2

## 2016-03-31 MED ORDER — LIDOCAINE HCL (CARDIAC) 10 MG/ML IV SOLN
INTRAVENOUS | Status: DC | PRN
  Administered 2016-03-31: 30 mg via INTRAVENOUS

## 2016-03-31 MED ORDER — CEFAZOLIN SODIUM-DEXTROSE 2-4 GM/100ML-% IV SOLN
2.0000 g | INTRAVENOUS | Status: AC
  Administered 2016-03-31: 2 g via INTRAVENOUS

## 2016-03-31 MED ORDER — GLUCAGON HCL RDNA (DIAGNOSTIC) 1 MG IJ SOLR
INTRAMUSCULAR | Status: AC
  Filled 2016-03-31: qty 2

## 2016-03-31 MED ORDER — CEFAZOLIN SODIUM-DEXTROSE 2-4 GM/100ML-% IV SOLN
INTRAVENOUS | Status: AC
  Filled 2016-03-31: qty 100

## 2016-03-31 NOTE — Anesthesia Procedure Notes (Signed)
Procedure Name: Intubation Date/Time: 03/31/2016 7:46 AM Performed by: Tressie Stalker E Pre-anesthesia Checklist: Patient identified, Patient being monitored, Timeout performed, Emergency Drugs available and Suction available Patient Re-evaluated:Patient Re-evaluated prior to inductionOxygen Delivery Method: Circle system utilized Preoxygenation: Pre-oxygenation with 100% oxygen Intubation Type: IV induction Ventilation: Mask ventilation without difficulty Laryngoscope Size: Mac and 3 Grade View: Grade I Tube type: Oral Tube size: 7.0 mm Number of attempts: 1 Airway Equipment and Method: Stylet Placement Confirmation: ETT inserted through vocal cords under direct vision,  positive ETCO2 and breath sounds checked- equal and bilateral Secured at: 21 cm Tube secured with: Tape Dental Injury: Teeth and Oropharynx as per pre-operative assessment

## 2016-03-31 NOTE — Anesthesia Postprocedure Evaluation (Signed)
Anesthesia Post Note  Patient: Regina [name]  Procedure(s) Performed: Procedure(s) (LRB): ENDOSCOPIC RETROGRADE CHOLANGIOPANCREATOGRAPHY (ERCP) (N/A) PARTIAL RETRIEVAL OF STONE AND STENTING (N/A) SPHINCTEROTOMY (N/A) MECHANICAL LITHOTRIPSY (N/A)  Patient location during evaluation: PACU Anesthesia Type: General Level of consciousness: awake and alert and oriented Pain management: pain level controlled Vital Signs Assessment: post-procedure vital signs reviewed and stable Respiratory status: spontaneous breathing Cardiovascular status: blood pressure returned to baseline Postop Assessment: no signs of nausea or vomiting Anesthetic complications: no    Last Vitals:  Vitals:   03/31/16 0945 03/31/16 1000  BP: (!) 134/53 (!) 131/51  Pulse: 69 (!) 55  Resp: 15 15  Temp:      Last Pain:  Vitals:   03/31/16 0635  TempSrc: Oral                 [name]

## 2016-03-31 NOTE — Op Note (Signed)
[facility] Patient Name: Regina [name] Procedure Date: 03/31/2016 7:21 AM MRN: [id number] Date of Birth: Sep 16, 1922 Attending MD: Hildred Laser , MD CSN: [id number] Age: 82 Admit Type: Outpatient Procedure:                ERCP Indications:              For therapy of bile duct stone(s) Providers:                Hildred Laser, MD, Lurline Del, RN, Charlyne Petrin                            RN, RN Referring MD:             Jasper Loser. Luan Pulling, MD Medicines:                Fentanyl 100 micrograms IV, General Anesthesia Complications:            No immediate complications. Estimated Blood Loss:     Estimated blood loss was minimal. minimal bleeding                            noted from gastric mucosa. Procedure:                Pre-Anesthesia Assessment:                           - Prior to the procedure, a History and Physical                            was performed, and patient medications and                            allergies were reviewed. The patient's tolerance of                            previous anesthesia was also reviewed. The risks                            and benefits of the procedure and the sedation                            options and risks were discussed with the patient.                            All questions were answered, and informed consent                            was obtained. Prior Anticoagulants: The patient                            last took Eliquis (apixaban) 3 days prior to the                            procedure. ASA Grade Assessment: III - A patient  with severe systemic disease. After reviewing the                            risks and benefits, the patient was deemed in                            satisfactory condition to undergo the procedure.                           After obtaining informed consent, the scope was                            passed under direct vision. Throughout the     procedure, the patient's blood pressure, pulse, and                            oxygen saturations were monitored continuously. The                            [id number]) scope was introduced through                            the mouth, and used to inject contrast into and                            used to inject contrast into the bile duct. The                            [id number]) scope was introduced through                            the and used to inject contrast into. The ERCP was                            technically difficult and complex due to                            challenging cannulation because of abnormal                            anatomy. Successful completion of the procedure was                            aided by applying abdominal pressure. The patient                            tolerated the procedure well. Scope In: 8:02:30 AM Scope Out: 9:15:45 AM Total Procedure Duration: 1 hour 13 minutes 15 seconds  Findings:      Fluoro image was normal. The scope was passed under direct vision       through the upper GI tract. A large hiatal hernia was present. it was       difficult to advance the scope and the second part of the duodenum.  Upper GI tract was therefore examined with Q scope and 035 guidewire       left in second part of the duodenum as a guide. Duodenoscope was finally       advanced into the second part of duodenum using abdominal pressure and       loop reduced. The major papilla was normal. The bile duct was deeply       cannulated with the Autotome sphincterotome. Contrast was injected. I       personally interpreted the bile duct images. There was brisk flow of       contrast through the ducts. Image quality was adequate. Contrast       extended to the main bile duct. Contrast extended to the bifurcation.       Contrast extended to the hepatic ducts. Choledocholithiasis was found in       a dilated duct. The common hepatic  duct contained one stone, which was       16 mm in diameter. A 10 mm biliary sphincterotomy was made with a       braided traction (standard) sphincterotome using ERBE electrocautery. No       bleeding at sphincterotomy. Lithotripsy with a basket-type device was       successful. The biliary tree was swept with a lithotriptor starting at       the bifurcation. Sludge was swept from the duct. A few stone fragments       were removed. All fragments were not removed. One 10 Fr by 7 cm biliary       stent with two internal flaps was placed 6.5 cm into the common bile       duct. Bile and sludge flowed through the stent. The stent was in good       position. Impression:               - Large hiatal hernia resulting difficult duodenal                            intubation.                           - The major papilla appeared normal.                           - Choledocholithiasis was found. There was single                            large stone filling up common hepatic duct and                            measuring about 16 mm.                           - Lithotripsy was successful                           - Partial removal was accomplished with biliary                            sphincterotomy; a stent was inserted.                           -  The biliary tree was swept with basket few times                            with removal of stone fragments and sludge.                           - One biliary stent was placed into the common bile                            duct. Moderate Sedation:      Per Anesthesia Care Recommendation:           - Avoid aspirin and nonsteroidal anti-inflammatory                            medicines for 3 days.                           - Continue present medications.                           - Clear liquids today and usual tomorrow mor                           - Resume Eliquis (apixaban) at prior dose in 3 days.                           - Repeat procedure in  6 to 8 weeks. Procedure Code(s):        --- Professional ---                           [phone], Endoscopic retrograde                            cholangiopancreatography (ERCP); with placement of                            endoscopic stent into biliary or pancreatic duct,                            including pre- and post-dilation and guide wire                            passage, when performed, including sphincterotomy,                            when performed, each stent                           [id number], Endoscopic retrograde                            cholangiopancreatography (ERCP); with destruction                            of calculi, any method (eg, mechanical,  electrohydraulic, lithotripsy) Diagnosis Code(s):        --- Professional ---                           K44.9, Diaphragmatic hernia without obstruction or                            gangrene                           K80.50, Calculus of bile duct without cholangitis                            or cholecystitis without obstruction CPT copyright 2016 American Medical Association. All rights reserved. The codes documented in this report are preliminary and upon coder review may  be revised to meet current compliance requirements. Hildred Laser, MD Hildred Laser, MD 03/31/2016 10:35:39 AM This report has been signed electronically. Number of Addenda: 0

## 2016-03-31 NOTE — Discharge Instructions (Signed)
Resume Apixaban on 04/04/2016 Resume other medications as before. Clear liquids the day then usual diet starting tomorrow morning. No driving for 24 hours. Office visit in 6-8 weeks.   Endoscopic Retrograde Cholangiopancreatography (ERCP), Care After Refer to this sheet in the next few weeks. These instructions provide you with information on caring for yourself after your procedure. Your health care provider may also give you more specific instructions. Your treatment has been planned according to current medical practices, but problems sometimes occur. Call your health care provider if you have any problems or questions after your procedure.  WHAT TO EXPECT AFTER THE PROCEDURE  After your procedure, it is typical to feel:   Soreness in your throat.   Sick to your stomach (nauseous).   Bloated.  Dizzy.   Fatigued. HOME CARE INSTRUCTIONS  Have a friend or family member stay with you for the first 24 hours after your procedure.  Start taking your usual medicines and eating normally as soon as you feel well enough to do so or as directed by your health care provider. SEEK MEDICAL CARE IF:  You have abdominal pain.   You develop signs of infection, such as:   Chills.   Feeling unwell.  SEEK IMMEDIATE MEDICAL CARE IF:  You have difficulty swallowing.  You have worsening throat, chest, or abdominal pain.  You vomit.  You have bloody or very black stools.  You have a fever.   This information is not intended to replace advice given to you by your health care provider. Make sure you discuss any questions you have with your health care provider.   Document Released: 03/12/2013 Document Reviewed: 03/12/2013 Elsevier Interactive Patient Education [employer].

## 2016-03-31 NOTE — Anesthesia Preprocedure Evaluation (Signed)
Anesthesia Evaluation  Patient identified by MRN, date of birth, ID band Patient awake    Reviewed: Allergy & Precautions, H&P , NPO status , Patient's Chart, lab work & pertinent test results, reviewed documented beta blocker date and time   Airway Mallampati: II  TM Distance: >3 FB Neck ROM: Full    Dental no notable dental hx. (+) Partial Upper, Partial Lower   Pulmonary neg pulmonary ROS,    Pulmonary exam normal breath sounds clear to auscultation       Cardiovascular hypertension, Pt. on medications and Pt. on home beta blockers Normal cardiovascular exam+ dysrhythmias Atrial Fibrillation  Rhythm:Regular Rate:Normal     Neuro/Psych CVA, Residual Symptoms negative psych ROS   GI/Hepatic Neg liver ROS, hiatal hernia,   Endo/Other  Hypothyroidism   Renal/GU negative Renal ROS  negative genitourinary   Musculoskeletal negative musculoskeletal ROS (+) Arthritis ,   Abdominal   Peds negative pediatric ROS (+)  Hematology negative hematology ROS (+)   Anesthesia Other Findings   Reproductive/Obstetrics negative OB ROS                             Anesthesia Physical Anesthesia Plan  ASA: III  Anesthesia Plan: General   Post-op Pain Management:    Induction: Intravenous  Airway Management Planned: Oral ETT  Additional Equipment:   Intra-op Plan:   Post-operative Plan: Extubation in OR  Informed Consent: I have reviewed the patients History and Physical, chart, labs and discussed the procedure including the risks, benefits and alternatives for the proposed anesthesia with the patient or authorized representative who has indicated his/her understanding and acceptance.     Plan Discussed with: CRNA  Anesthesia Plan Comments: (amidate induction)        Anesthesia Quick Evaluation

## 2016-03-31 NOTE — Transfer of Care (Signed)
Immediate Anesthesia Transfer of Care Note  Patient: Regina [name]  Procedure(s) Performed: Procedure(s): ENDOSCOPIC RETROGRADE CHOLANGIOPANCREATOGRAPHY (ERCP) (N/A) PARTIAL RETRIEVAL OF STONE AND STENTING (N/A) SPHINCTEROTOMY (N/A) MECHANICAL LITHOTRIPSY (N/A)  Patient Location: PACU  Anesthesia Type:General  Level of Consciousness: awake  Airway & Oxygen Therapy: Patient Spontanous Breathing and Patient connected to face mask oxygen  Post-op Assessment: Report given to RN  Post vital signs: Reviewed and stable  Last Vitals:  Vitals:   03/31/16 0725 03/31/16 0730  BP: (!) 144/61 (!) 157/72  Resp: (!) 30 (!) 29  Temp:      Last Pain:  Vitals:   03/31/16 0635  TempSrc: Oral      Patients Stated Pain Goal: 6 ([id number])  Complications: No apparent anesthesia complications

## 2016-03-31 NOTE — H&P (Signed)
Regina [name] is an 82 y.o. female.   Chief Complaint: Patient is here for ERCP with sphincterotomy and stone extraction. HPI: Patient is 82 year old Caucasian female who was diagnosed with cholelithiasis and choledocholithiasis back in April 2014 when she presented with infrascapular pain nausea and vomiting. She has had intermittent fleeting symptoms ever since. She has declined to undergo treatment. She was seen in the office in March this year and wondered weight. She is had 2 or 3 episodes since then. She had an episode Sunday. She generally has better within a couple of hours. None of these episodes is associated with fever or chills. She has good appetite and her weight lately is been stable. Patient has been off Apixaban for 2 days.  Past Medical History:  Diagnosis Date  . Anemia   . Arthritis   . Avascular necrosis of bone of left hip ([place])   . Dysrhythmia 4/14   atrial fib post op  . H/O hiatal hernia   . History of blood transfusion   . Hypertension   . Hypothyroidism   . Stroke [facility]) 2003   No deficits    Past Surgical History:  Procedure Laterality Date  . EYE SURGERY Right   . HIP PINNING,CANNULATED Left 09/13/2012   Procedure: ASNIS HIP PINNING LEFT;  Surgeon: Sanjuana Kava, MD;  Location: [facility];  Service: Orthopedics;  Laterality: Left;  Marland Kitchen Mouth Sugery     Upper teeth impacted  . TOTAL HIP ARTHROPLASTY Left 10/03/2013   Procedure: LEFT TOTAL HIP ARTHROPLASTY ANTERIOR APPROACH and REMOVAL OF SCREWS LEFT HIP;  Surgeon: Mcarthur Rossetti, MD;  Location: [facility];  Service: Orthopedics;  Laterality: Left;    Family History  Problem Relation Age of Onset  . Heart attack Mother   . Heart attack Father   . Cancer Brother    Social History:  reports that she has never smoked. She has never used smokeless tobacco. She reports that she does not drink alcohol or use drugs.  Allergies: No Known Allergies  Medications Prior to Admission  Medication Sig Dispense  Refill  . acetaminophen (TYLENOL) 650 MG CR tablet Take 650 mg by mouth every 8 (eight) hours as needed for pain.    Marland Kitchen apixaban (ELIQUIS) 2.5 MG TABS tablet Take 1 tablet (2.5 mg total) by mouth 2 (two) times daily. 60 tablet 6  . diltiazem (CARDIZEM CD) 120 MG 24 hr capsule Take 1 capsule (120 mg total) by mouth daily. 90 capsule 3  . levothyroxine (SYNTHROID, LEVOTHROID) 100 MCG tablet Take 100 mcg by mouth daily before breakfast.    . metoprolol tartrate (LOPRESSOR) 25 MG tablet take 1 tablet by mouth twice a day 180 tablet 3  . mupirocin ointment (BACTROBAN) 2 % apply to affected area twice a day  0  . metoprolol tartrate (LOPRESSOR) 25 MG tablet take 1 tablet by mouth twice a day (Patient not taking: Reported on 03/27/2016) 180 tablet 3    Results for orders placed or performed during the hospital encounter of 03/31/16 (from the past 48 hour(s))  Glucose, capillary     Status: Abnormal   Collection Time: 03/31/16  6:59 AM  Result Value Ref Range   Glucose-Capillary 114 (H) 65 - 99 mg/dL   No results found.  ROS  Blood pressure (!) 151/93, temperature 97.7 F (36.5 C), temperature source Oral, resp. rate (!) 28, SpO2 94 %. Physical Exam  Constitutional:  Well-developed well-nourished Caucasian female who is in no acute distress.  HENT:  Mouth/Throat: Oropharynx is clear and moist.  Eyes: Conjunctivae are normal. No scleral icterus.  Neck: No thyromegaly present.  Cardiovascular: Normal rate and regular rhythm.   Grade 2/6 systolic ejection murmur best heard at left sternal border.  Respiratory: Effort normal and breath sounds normal.  GI: Soft. She exhibits no distension and no mass. There is no tenderness.  Musculoskeletal: She exhibits no edema.  Lymphadenopathy:    She has no cervical adenopathy.  Neurological: She is alert.  Skin: Skin is warm and dry.     Assessment/Plan Choledocholithiasis with intermittent symptoms dating back to April 2014. Cholelithiasis. ERCP  with sphincterotomy and stone extraction. One of the stones is 16 mm and may be calcified. She may need mechanical lithotripsy. Procedure and risks reviewed with the patient and daughter son and daughter-in-law. They're all in agreement.  Hildred Laser, MD 03/31/2016, 7:22 AM

## 2016-04-05 ENCOUNTER — Encounter (HOSPITAL_COMMUNITY): Payer: Self-pay | Admitting: Internal Medicine

## 2016-04-05 ENCOUNTER — Encounter (INDEPENDENT_AMBULATORY_CARE_PROVIDER_SITE_OTHER): Payer: Self-pay | Admitting: *Deleted

## 2016-04-05 NOTE — Telephone Encounter (Signed)
This encounter was created in error - please disregard.

## 2016-04-10 ENCOUNTER — Encounter (INDEPENDENT_AMBULATORY_CARE_PROVIDER_SITE_OTHER): Payer: Self-pay | Admitting: *Deleted

## 2016-04-10 ENCOUNTER — Telehealth (INDEPENDENT_AMBULATORY_CARE_PROVIDER_SITE_OTHER): Payer: Self-pay | Admitting: *Deleted

## 2016-04-10 ENCOUNTER — Other Ambulatory Visit (INDEPENDENT_AMBULATORY_CARE_PROVIDER_SITE_OTHER): Payer: Self-pay | Admitting: *Deleted

## 2016-04-10 DIAGNOSIS — K8050 Calculus of bile duct without cholangitis or cholecystitis without obstruction: Secondary | ICD-10-CM | POA: Insufficient documentation

## 2016-04-10 NOTE — Telephone Encounter (Addendum)
Patient sch'd for 4-6 wk repeat ERCP 05/05/16 -- she wants to know if she needs to keep OV w/ Terri on 05/16/16 -- also she becomes constipated about 2 days after she has procedures with any type of sedation  -- is there something they can do to help her with this so she doesn't get so constipated after she has ERCP on 05/05/16 -- please advise

## 2016-04-13 NOTE — Telephone Encounter (Signed)
Please disregard loss note. She is doing well. She therefore does not need office visit. Proceed with ERCP as planned. Patient advised to take Colace 20 mg daily or as needed. Can use MOM on as-needed basis.

## 2016-04-14 NOTE — Telephone Encounter (Signed)
OV canceled, patient aware

## 2016-04-26 NOTE — Patient Instructions (Signed)
Regina [name]  04/26/2016     @PREFPERIOPPHARMACY @   Your procedure is scheduled on 05/05/2016.  Report to [facility] at 10:20 A.M.  Call this number if you have problems the morning of surgery:  [phone]   Remember:  Do not eat food or drink liquids after midnight.  Take these medicines the morning of surgery with A SIP OF WATER Cardizem, Synthroid, Lopressor   Do not wear jewelry, make-up or nail polish.  Do not wear lotions, powders, or perfumes, or deoderant.  Do not shave 48 hours prior to surgery.  Men may shave face and neck.  Do not bring valuables to the hospital.  [facility] is not responsible for any belongings or valuables.  Contacts, dentures or bridgework may not be worn into surgery.  Leave your suitcase in the car.  After surgery it may be brought to your room.  For patients admitted to the hospital, discharge time will be determined by your treatment team.  Patients discharged the day of surgery will not be allowed to drive home.    Please read over the following fact sheets that you were given. Anesthesia Post-op Instructions     PATIENT INSTRUCTIONS POST-ANESTHESIA  IMMEDIATELY FOLLOWING SURGERY:  Do not drive or operate machinery for the first twenty four hours after surgery.  Do not make any important decisions for twenty four hours after surgery or while taking narcotic pain medications or sedatives.  If you develop intractable nausea and vomiting or a severe headache please notify your doctor immediately.  FOLLOW-UP:  Please make an appointment with your surgeon as instructed. You do not need to follow up with anesthesia unless specifically instructed to do so.  WOUND CARE INSTRUCTIONS (if applicable):  Keep a dry clean dressing on the anesthesia/puncture wound site if there is drainage.  Once the wound has quit draining you may leave it open to air.  Generally you should leave the bandage intact for twenty four hours unless there is drainage.   If the epidural site drains for more than 36-48 hours please call the anesthesia department.  QUESTIONS?:  Please feel free to call your physician or the hospital operator if you have any questions, and they will be happy to assist you.      Endoscopic Retrograde Cholangiopancreatography (ERCP) Endoscopic retrograde cholangiopancreatography (ERCP) is a procedure used to diagnosis many diseases of the pancreas, bile ducts, liver, and gallbladder. During ERCP a thin, lighted tube (endoscope) is passed through the mouth and down the back of the throat into the first part of the small intestine (duodenum). A small, plastic tube (cannula) is then passed through the endoscope and directed into the bile duct or pancreatic duct. Dye is then injected through the cannula and X-rays are taken to study the biliary and pancreatic passageways.  LET [facility] CARE PROVIDER KNOW ABOUT:   Any allergies you have.   All medicines you are taking, including vitamins, herbs, eyedrops, creams, and over-the-counter medicines.   Previous problems you or members of your family have had with the use of anesthetics.   Any blood disorders you have.   Previous surgeries you have had.   Medical conditions you have. RISKS AND COMPLICATIONS Generally, ERCP is a safe procedure. However, as with any procedure, complications can occur. A simple removal of gallstones has the lowest rate of complications. Higher rates of complication occur in people who have poorly functioning bile or pancreatic ducts. Possible complications include:   Pancreatitis.  Bleeding.  Accidental punctures in the bowel wall, pancreas, or gall bladder.  Gall bladder or bile duct infection. BEFORE THE PROCEDURE   Do not eat or drink anything, including water, for at least 8 hours before the procedure or as directed by your health care provider.   Ask your health care provider whether you should stop taking certain medicines prior to your  procedure.   Arrange for someone to drive you home. You will not be allowed to drive for [id number] hours after the procedure. PROCEDURE   You will be given medicine through a vein (intravenously) to make you relaxed and sleepy.   You might have a breathing tube placed to give you medicine that makes you sleep (general anesthetic).   Your throat may be sprayed with medicine that numbs the area and prevents gagging (local anesthetic), or you may gargle this medicine.   You will lie on your left side.   The endoscope will be inserted through your mouth and into the duodenum. The tube will not interfere with your breathing. Gagging is prevented by the anesthesia.   While X-rays are being taken, you may be positioned on your stomach.   A small sample of tissue (biopsy) may be removed for examination. AFTER THE PROCEDURE   You will rest in bed until you are fully conscious.   When you first wake up, your throat may feel slightly sore.   You will not be allowed to eat or drink until numbness subsides.   Once you are able to drink, urinate, and sit on the edge of the bed without feeling sick to your stomach (nauseous) or dizzy, you may be allowed to go home. This information is not intended to replace advice given to you by your health care provider. Make sure you discuss any questions you have with your health care provider. Document Released: 02/14/2001 Document Revised: 03/12/2013 Document Reviewed: 12/31/2012 Elsevier Interactive Patient Education  2017 [employer].

## 2016-05-02 ENCOUNTER — Encounter (HOSPITAL_COMMUNITY)
Admission: RE | Admit: 2016-05-02 | Discharge: 2016-05-02 | Disposition: A | Discharge status: Home / Self Care | Payer: Medicare Other | Source: Ambulatory Visit | Attending: Internal Medicine | Admitting: Internal Medicine

## 2016-05-02 ENCOUNTER — Encounter (HOSPITAL_COMMUNITY): Payer: Self-pay

## 2016-05-02 DIAGNOSIS — Z8673 Personal history of transient ischemic attack (TIA), and cerebral infarction without residual deficits: Secondary | ICD-10-CM | POA: Diagnosis not present

## 2016-05-02 DIAGNOSIS — K449 Diaphragmatic hernia without obstruction or gangrene: Secondary | ICD-10-CM | POA: Diagnosis not present

## 2016-05-02 DIAGNOSIS — K8050 Calculus of bile duct without cholangitis or cholecystitis without obstruction: Secondary | ICD-10-CM

## 2016-05-02 DIAGNOSIS — Z8249 Family history of ischemic heart disease and other diseases of the circulatory system: Secondary | ICD-10-CM | POA: Diagnosis not present

## 2016-05-02 DIAGNOSIS — Z4659 Encounter for fitting and adjustment of other gastrointestinal appliance and device: Secondary | ICD-10-CM | POA: Diagnosis not present

## 2016-05-02 DIAGNOSIS — I10 Essential (primary) hypertension: Secondary | ICD-10-CM | POA: Diagnosis not present

## 2016-05-02 DIAGNOSIS — E039 Hypothyroidism, unspecified: Secondary | ICD-10-CM | POA: Diagnosis not present

## 2016-05-02 DIAGNOSIS — I4891 Unspecified atrial fibrillation: Secondary | ICD-10-CM | POA: Diagnosis not present

## 2016-05-02 DIAGNOSIS — Z809 Family history of malignant neoplasm, unspecified: Secondary | ICD-10-CM | POA: Diagnosis not present

## 2016-05-02 DIAGNOSIS — Z96642 Presence of left artificial hip joint: Secondary | ICD-10-CM | POA: Diagnosis not present

## 2016-05-02 DIAGNOSIS — M1990 Unspecified osteoarthritis, unspecified site: Secondary | ICD-10-CM | POA: Diagnosis not present

## 2016-05-02 LAB — COMPREHENSIVE METABOLIC PANEL
ALBUMIN: 4.1 g/dL (ref 3.5–5.0)
ALK PHOS: 81 U/L (ref 38–126)
ALT: 12 U/L — ABNORMAL LOW (ref 14–54)
ANION GAP: 9 (ref 5–15)
AST: 22 U/L (ref 15–41)
BUN: 18 mg/dL (ref 6–20)
CHLORIDE: 102 mmol/L (ref 101–111)
CO2: 25 mmol/L (ref 22–32)
Calcium: 9.5 mg/dL (ref 8.9–10.3)
Creatinine, Ser: 0.81 mg/dL (ref 0.44–1.00)
GFR calc non Af Amer: >60 mL/min (ref >60)
GLUCOSE: 103 mg/dL — AB (ref 65–99)
POTASSIUM: 4.5 mmol/L (ref 3.5–5.1)
SODIUM: 136 mmol/L (ref 135–145)
Total Bilirubin: 0.8 mg/dL (ref 0.3–1.2)
Total Protein: 7.4 g/dL (ref 6.5–8.1)

## 2016-05-02 LAB — CBC WITH DIFFERENTIAL/PLATELET
BASOS PCT: 0 %
Basophils Absolute: 0 10*3/uL (ref 0.0–0.1)
EOS ABS: 0.1 10*3/uL (ref 0.0–0.7)
EOS PCT: 1 %
HCT: 45.7 % (ref 36.0–46.0)
HEMOGLOBIN: 15.1 g/dL — AB (ref 12.0–15.0)
Lymphocytes Relative: 21 %
Lymphs Abs: 1.7 10*3/uL (ref 0.7–4.0)
MCH: 30.2 pg (ref 26.0–34.0)
MCHC: 33 g/dL (ref 30.0–36.0)
MCV: 91.4 fL (ref 78.0–100.0)
MONOS PCT: 8 %
Monocytes Absolute: 0.7 10*3/uL (ref 0.1–1.0)
NEUTROS PCT: 70 %
Neutro Abs: 5.5 10*3/uL (ref 1.7–7.7)
PLATELETS: 187 10*3/uL (ref 150–400)
RBC: 5 MIL/uL (ref 3.87–5.11)
RDW: 14.3 % (ref 11.5–15.5)
WBC: 8 10*3/uL (ref 4.0–10.5)

## 2016-05-05 ENCOUNTER — Ambulatory Visit (HOSPITAL_COMMUNITY): Payer: Medicare Other | Admitting: Anesthesiology

## 2016-05-05 ENCOUNTER — Ambulatory Visit (HOSPITAL_COMMUNITY): Payer: Medicare Other

## 2016-05-05 ENCOUNTER — Encounter (HOSPITAL_COMMUNITY): Payer: Self-pay | Admitting: *Deleted

## 2016-05-05 ENCOUNTER — Ambulatory Visit (HOSPITAL_COMMUNITY)
Admission: RE | Admit: 2016-05-05 | Discharge: 2016-05-05 | Disposition: A | Discharge status: Home / Self Care | Payer: Medicare Other | Source: Ambulatory Visit | Attending: Internal Medicine | Admitting: Internal Medicine

## 2016-05-05 ENCOUNTER — Encounter (HOSPITAL_COMMUNITY)
Admission: RE | Disposition: A | Discharge status: Home / Self Care | Payer: Self-pay | Source: Ambulatory Visit | Attending: Internal Medicine

## 2016-05-05 DIAGNOSIS — I4891 Unspecified atrial fibrillation: Secondary | ICD-10-CM | POA: Insufficient documentation

## 2016-05-05 DIAGNOSIS — Z96642 Presence of left artificial hip joint: Secondary | ICD-10-CM | POA: Diagnosis not present

## 2016-05-05 DIAGNOSIS — Z4659 Encounter for fitting and adjustment of other gastrointestinal appliance and device: Secondary | ICD-10-CM | POA: Insufficient documentation

## 2016-05-05 DIAGNOSIS — M1990 Unspecified osteoarthritis, unspecified site: Secondary | ICD-10-CM | POA: Insufficient documentation

## 2016-05-05 DIAGNOSIS — Z8673 Personal history of transient ischemic attack (TIA), and cerebral infarction without residual deficits: Secondary | ICD-10-CM | POA: Insufficient documentation

## 2016-05-05 DIAGNOSIS — E039 Hypothyroidism, unspecified: Secondary | ICD-10-CM | POA: Diagnosis not present

## 2016-05-05 DIAGNOSIS — K8050 Calculus of bile duct without cholangitis or cholecystitis without obstruction: Secondary | ICD-10-CM

## 2016-05-05 DIAGNOSIS — Z8249 Family history of ischemic heart disease and other diseases of the circulatory system: Secondary | ICD-10-CM | POA: Diagnosis not present

## 2016-05-05 DIAGNOSIS — I10 Essential (primary) hypertension: Secondary | ICD-10-CM | POA: Diagnosis not present

## 2016-05-05 DIAGNOSIS — K449 Diaphragmatic hernia without obstruction or gangrene: Secondary | ICD-10-CM | POA: Diagnosis not present

## 2016-05-05 DIAGNOSIS — Z809 Family history of malignant neoplasm, unspecified: Secondary | ICD-10-CM | POA: Diagnosis not present

## 2016-05-05 HISTORY — PX: BALLOON DILATION: SHX5330

## 2016-05-05 HISTORY — PX: GASTROINTESTINAL STENT REMOVAL: SHX6384

## 2016-05-05 HISTORY — PX: REMOVAL OF STONES: SHX5545

## 2016-05-05 HISTORY — PX: SPYGLASS CHOLANGIOSCOPY: SHX5441

## 2016-05-05 HISTORY — PX: ERCP: SHX5425

## 2016-05-05 SURGERY — ERCP, WITH INTERVENTION IF INDICATED
Anesthesia: General

## 2016-05-05 MED ORDER — SUCCINYLCHOLINE CHLORIDE 20 MG/ML IJ SOLN
INTRAMUSCULAR | Status: AC
  Filled 2016-05-05: qty 1

## 2016-05-05 MED ORDER — EPHEDRINE SULFATE 50 MG/ML IJ SOLN
INTRAMUSCULAR | Status: DC | PRN
  Administered 2016-05-05 (×2): 10 mg via INTRAVENOUS

## 2016-05-05 MED ORDER — GLUCAGON HCL RDNA (DIAGNOSTIC) 1 MG IJ SOLR
INTRAMUSCULAR | Status: AC
  Filled 2016-05-05: qty 2

## 2016-05-05 MED ORDER — CEFAZOLIN SODIUM-DEXTROSE 2-4 GM/100ML-% IV SOLN
2.0000 g | INTRAVENOUS | Status: AC
  Administered 2016-05-05: 2 g via INTRAVENOUS
  Filled 2016-05-05: qty 100

## 2016-05-05 MED ORDER — ETOMIDATE 2 MG/ML IV SOLN
INTRAVENOUS | Status: AC
  Filled 2016-05-05: qty 10

## 2016-05-05 MED ORDER — PHENYLEPHRINE HCL 10 MG/ML IJ SOLN
INTRAMUSCULAR | Status: DC | PRN
  Administered 2016-05-05: 60 ug via INTRAVENOUS
  Administered 2016-05-05: 40 ug via INTRAVENOUS
  Administered 2016-05-05 (×2): 80 ug via INTRAVENOUS
  Administered 2016-05-05: 40 ug via INTRAVENOUS

## 2016-05-05 MED ORDER — FENTANYL CITRATE (PF) 100 MCG/2ML IJ SOLN: 25.0000 ug | INTRAMUSCULAR | Status: DC | PRN

## 2016-05-05 MED ORDER — FENTANYL CITRATE (PF) 100 MCG/2ML IJ SOLN
INTRAMUSCULAR | Status: AC
  Filled 2016-05-05: qty 2

## 2016-05-05 MED ORDER — MIDAZOLAM HCL 2 MG/2ML IJ SOLN
1.0000 mg | INTRAMUSCULAR | Status: DC | PRN
  Administered 2016-05-05 (×2): 1 mg via INTRAVENOUS
  Filled 2016-05-05: qty 2

## 2016-05-05 MED ORDER — LIDOCAINE HCL 1 % IJ SOLN
INTRAMUSCULAR | Status: DC | PRN
  Administered 2016-05-05: 30 mg via INTRADERMAL

## 2016-05-05 MED ORDER — EPHEDRINE SULFATE 50 MG/ML IJ SOLN
INTRAMUSCULAR | Status: AC
Start: 2016-05-05 — End: 2016-05-05
  Filled 2016-05-05: qty 1

## 2016-05-05 MED ORDER — SODIUM CHLORIDE 0.9 % IJ SOLN
INTRAMUSCULAR | Status: AC
  Filled 2016-05-05: qty 10

## 2016-05-05 MED ORDER — LIDOCAINE HCL (PF) 1 % IJ SOLN
INTRAMUSCULAR | Status: AC
  Filled 2016-05-05: qty 5

## 2016-05-05 MED ORDER — LACTATED RINGERS IV SOLN
INTRAVENOUS | Status: DC
  Administered 2016-05-05 (×2): via INTRAVENOUS

## 2016-05-05 MED ORDER — ROCURONIUM BROMIDE 50 MG/5ML IV SOLN
INTRAVENOUS | Status: AC
  Filled 2016-05-05: qty 1

## 2016-05-05 MED ORDER — IOPAMIDOL (ISOVUE-300) INJECTION 61%
INTRAVENOUS | Status: AC
  Filled 2016-05-05: qty 100

## 2016-05-05 MED ORDER — ETOMIDATE 2 MG/ML IV SOLN
INTRAVENOUS | Status: DC | PRN
  Administered 2016-05-05: 10 mg via INTRAVENOUS

## 2016-05-05 MED ORDER — SUCCINYLCHOLINE CHLORIDE 20 MG/ML IJ SOLN
INTRAMUSCULAR | Status: DC | PRN
  Administered 2016-05-05: 80 mg via INTRAVENOUS

## 2016-05-05 MED ORDER — ROCURONIUM BROMIDE 100 MG/10ML IV SOLN
INTRAVENOUS | Status: DC | PRN
  Administered 2016-05-05: 5 mg via INTRAVENOUS

## 2016-05-05 MED ORDER — PHENYLEPHRINE 40 MCG/ML (10ML) SYRINGE FOR IV PUSH (FOR BLOOD PRESSURE SUPPORT)
PREFILLED_SYRINGE | INTRAVENOUS | Status: AC
  Filled 2016-05-05: qty 10

## 2016-05-05 MED ORDER — GLYCOPYRROLATE 0.2 MG/ML IJ SOLN
INTRAMUSCULAR | Status: DC | PRN
  Administered 2016-05-05: 0.2 mg via INTRAVENOUS

## 2016-05-05 MED ORDER — FENTANYL CITRATE (PF) 100 MCG/2ML IJ SOLN
INTRAMUSCULAR | Status: DC | PRN
  Administered 2016-05-05: 50 ug via INTRAVENOUS
  Administered 2016-05-05: 25 ug via INTRAVENOUS
  Administered 2016-05-05 (×2): 50 ug via INTRAVENOUS

## 2016-05-05 NOTE — Anesthesia Postprocedure Evaluation (Signed)
Anesthesia Post Note  Patient: Mirage A Amison  Procedure(s) Performed: Procedure(s) (LRB): ENDOSCOPIC RETROGRADE CHOLANGIOPANCREATOGRAPHY (ERCP) (N/A) SPYGLASS CHOLANGIOSCOPY (N/A) REMOVAL OF STONES (N/A) Biliary stent removal. (N/A) BALLOON DILATION OF SPHINCTEROTOMY (N/A)  Patient location during evaluation: PACU Anesthesia Type: General Level of consciousness: awake and alert Pain management: pain level controlled Vital Signs Assessment: post-procedure vital signs reviewed and stable Respiratory status: spontaneous breathing Cardiovascular status: blood pressure returned to baseline Postop Assessment: no signs of nausea or vomiting Anesthetic complications: no    Last Vitals:  Vitals:   05/05/16 1345 05/05/16 1400  BP: 116/65 115/76  Pulse: (!) 111 94  Resp: 12 (!) 21  Temp:      Last Pain:  Vitals:   05/05/16 1037  TempSrc: Oral                 Ivy Meriwether

## 2016-05-05 NOTE — Anesthesia Procedure Notes (Signed)
Procedure Name: Intubation Date/Time: 05/05/2016 12:14 PM Performed by: Tressie Stalker E Pre-anesthesia Checklist: Patient identified, Patient being monitored, Timeout performed, Emergency Drugs available and Suction available Patient Re-evaluated:Patient Re-evaluated prior to inductionOxygen Delivery Method: Circle system utilized Preoxygenation: Pre-oxygenation with 100% oxygen Intubation Type: IV induction Ventilation: Mask ventilation without difficulty Laryngoscope Size: Mac and 3 Grade View: Grade I Tube type: Oral Tube size: 7.0 mm Number of attempts: 1 Airway Equipment and Method: Stylet Placement Confirmation: ETT inserted through vocal cords under direct vision,  positive ETCO2 and breath sounds checked- equal and bilateral Secured at: 21 cm Tube secured with: Tape Dental Injury: Teeth and Oropharynx as per pre-operative assessment

## 2016-05-05 NOTE — Transfer of Care (Signed)
Immediate Anesthesia Transfer of Care Note  Patient: Regina [name]  Procedure(s) Performed: Procedure(s) with comments: ENDOSCOPIC RETROGRADE CHOLANGIOPANCREATOGRAPHY (ERCP) (N/A) - do NOT move per Melanie SPYGLASS CHOLANGIOSCOPY (N/A) REMOVAL OF STONES (N/A) Biliary stent removal. (N/A) BALLOON DILATION OF SPHINCTEROTOMY (N/A)  Patient Location: PACU  Anesthesia Type:General  Level of Consciousness: awake  Airway & Oxygen Therapy: Patient Spontanous Breathing  Post-op Assessment: Report given to RN  Post vital signs: Reviewed  Last Vitals:  Vitals:   05/05/16 1150 05/05/16 1155  BP:  119/65  Pulse:    Resp: 20 19  Temp:      Last Pain:  Vitals:   05/05/16 1037  TempSrc: Oral      Patients Stated Pain Goal: 5 ([id number])  Complications: No apparent anesthesia complications

## 2016-05-05 NOTE — Discharge Instructions (Signed)
Resume Apixaban on 05/06/2016. Resume other medications as before. Clear liquids today. Resume usual diet starting tomorrow morning. No driving for 24 hours. Office visit in 2 months. Office will call.  Endoscopic Retrograde Cholangiopancreatography (ERCP), Care After Refer to this sheet in the next few weeks. These instructions provide you with information on caring for yourself after your procedure. Your health care provider may also give you more specific instructions. Your treatment has been planned according to current medical practices, but problems sometimes occur. Call your health care provider if you have any problems or questions after your procedure.  WHAT TO EXPECT AFTER THE PROCEDURE  After your procedure, it is typical to feel:   Soreness in your throat.   Sick to your stomach (nauseous).   Bloated.  Dizzy.   Fatigued. HOME CARE INSTRUCTIONS  Have a friend or family member stay with you for the first 24 hours after your procedure.  Start taking your usual medicines and eating normally as soon as you feel well enough to do so or as directed by your health care provider. SEEK MEDICAL CARE IF:  You have abdominal pain.   You develop signs of infection, such as:   Chills.   Feeling unwell.  SEEK IMMEDIATE MEDICAL CARE IF:  You have difficulty swallowing.  You have worsening throat, chest, or abdominal pain.  You vomit.  You have bloody or very black stools.  You have a fever. This information is not intended to replace advice given to you by your health care provider. Make sure you discuss any questions you have with your health care provider. Document Released: 03/12/2013 Document Reviewed: 03/12/2013 Elsevier Interactive Patient Education  2017 [place] POST-ANESTHESIA  IMMEDIATELY FOLLOWING SURGERY:  Do not drive or operate machinery for the first twenty four hours after surgery.  Do not make any important decisions  for twenty four hours after surgery or while taking narcotic pain medications or sedatives.  If you develop intractable nausea and vomiting or a severe headache please notify your doctor immediately.  FOLLOW-UP:  Please make an appointment with your surgeon as instructed. You do not need to follow up with anesthesia unless specifically instructed to do so.  WOUND CARE INSTRUCTIONS (if applicable):  Keep a dry clean dressing on the anesthesia/puncture wound site if there is drainage.  Once the wound has quit draining you may leave it open to air.  Generally you should leave the bandage intact for twenty four hours unless there is drainage.  If the epidural site drains for more than 36-48 hours please call the anesthesia department.  QUESTIONS?:  Please feel free to call your physician or the hospital operator if you have any questions, and they will be happy to assist you.

## 2016-05-05 NOTE — H&P (Signed)
Regina [name] is an 82 y.o. female.   Chief Complaint: She is here for ERCP with stent and stone fragment removal. HPI: Patient is 82 year old Caucasian female with history of choledocholithiasis who underwent ERCP on 03/31/2016 and found to have large CBD stone. Mechanical trips he was performed in some of the fragments were removed. All stone fragments not removed. There was stent was left in place. She has had no symptoms whatsoever. LFTs are normal. She is not returning for repeat procedure in order to remove stent and remaining stone fragments. Patient has large hiatal hernia resulting in difficult passage of oh from stomach into duodenum.  Past Medical History:  Diagnosis Date  . Anemia   . Arthritis   . Avascular necrosis of bone of left hip ([place])   . Dysrhythmia 4/14   atrial fib post op  . H/O hiatal hernia   . History of blood transfusion   . Hypertension   . Hypothyroidism   . Stroke [facility] 2003   No deficits    Past Surgical History:  Procedure Laterality Date  . ERCP N/A 03/31/2016   Procedure: ENDOSCOPIC RETROGRADE CHOLANGIOPANCREATOGRAPHY (ERCP);  Surgeon: Rogene Houston, MD;  [facility];  Service: Endoscopy;  Laterality: N/A;  . EYE SURGERY Right   . HIP PINNING,CANNULATED Left 09/13/2012   Procedure: ASNIS HIP PINNING LEFT;  Surgeon: Sanjuana Kava, MD;  Location: [facility];  Service: Orthopedics;  Laterality: Left;  . LITHOTRIPSY N/A 03/31/2016   Procedure: MECHANICAL LITHOTRIPSY;  Surgeon: Rogene Houston, MD;  Location[facility];  Service: Endoscopy;  Laterality: N/A;  . Mouth Sugery     Upper teeth impacted  . REMOVAL OF STONES N/A 03/31/2016   Procedure: PARTIAL RETRIEVAL OF STONE AND STENTING;  Surgeon: Rogene Houston, MD;  Location[facility];  Service: Endoscopy;  Laterality: N/A;  . SPHINCTEROTOMY N/A 03/31/2016   Procedure: SPHINCTEROTOMY;  Surgeon: Rogene Houston, MD;  Location[facility];  Service: Endoscopy;  Laterality: N/A;   . TOTAL HIP ARTHROPLASTY Left 10/03/2013   Procedure: LEFT TOTAL HIP ARTHROPLASTY ANTERIOR APPROACH and REMOVAL OF SCREWS LEFT HIP;  Surgeon: Mcarthur Rossetti, MD;  Location: [facility];  Service: Orthopedics;  Laterality: Left;    Family History  Problem Relation Age of Onset  . Heart attack Mother   . Heart attack Father   . Cancer Brother    Social History:  reports that she has never smoked. She has never used smokeless tobacco. She reports that she does not drink alcohol or use drugs.  Allergies: No Known Allergies  Medications Prior to Admission  Medication Sig Dispense Refill  . acetaminophen (TYLENOL) 650 MG CR tablet Take 650 mg by mouth every 8 (eight) hours as needed for pain.    Marland Kitchen apixaban (ELIQUIS) 2.5 MG TABS tablet Take 1 tablet (2.5 mg total) by mouth 2 (two) times daily. 60 tablet 6  . diltiazem (CARDIZEM CD) 120 MG 24 hr capsule Take 1 capsule (120 mg total) by mouth daily. 90 capsule 3  . levothyroxine (SYNTHROID, LEVOTHROID) 100 MCG tablet Take 100 mcg by mouth daily before breakfast.    . metoprolol tartrate (LOPRESSOR) 25 MG tablet take 1 tablet by mouth twice a day 180 tablet 3    No results found for this or any previous visit (from the past 48 hour(s)). No results found.  ROS  Blood pressure (!) 142/71, pulse 70, temperature 97.6 F (36.4 C), temperature source Oral, resp. rate 18,  height 5\' 4"  (1.626 m), weight 130 lb (59 kg), SpO2 92 %. Physical Exam  Constitutional: She appears well-developed and well-nourished.  HENT:  Mouth/Throat: Oropharynx is clear and moist.  Eyes: Conjunctivae are normal. No scleral icterus.  Neck: No thyromegaly present.  Cardiovascular: Normal rate, regular rhythm and normal heart sounds.   No murmur heard. Respiratory: Effort normal and breath sounds normal.  GI:  Abdomen is soft and nontender without organomegaly or masses.  Musculoskeletal: She exhibits no edema.  Lymphadenopathy:    She has no cervical adenopathy.   Neurological: She is alert.  Skin: Skin is warm and dry.     Assessment/Plan Choledocholithiasis. ERCP with stent and stone fragment removal.  Hildred Laser, MD 05/05/2016, 11:11 AM

## 2016-05-05 NOTE — Anesthesia Preprocedure Evaluation (Signed)
Anesthesia Evaluation  Patient identified by MRN, date of birth, ID band Patient awake    Reviewed: Allergy & Precautions, H&P , NPO status , Patient's Chart, lab work & pertinent test results, reviewed documented beta blocker date and time   Airway Mallampati: II  TM Distance: >3 FB Neck ROM: Full    Dental no notable dental hx. (+) Partial Upper, Partial Lower   Pulmonary neg pulmonary ROS,    Pulmonary exam normal breath sounds clear to auscultation       Cardiovascular hypertension, Pt. on medications and Pt. on home beta blockers Normal cardiovascular exam+ dysrhythmias Atrial Fibrillation  Rhythm:Regular Rate:Normal     Neuro/Psych CVA, Residual Symptoms negative psych ROS   GI/Hepatic Neg liver ROS, hiatal hernia,   Endo/Other  Hypothyroidism   Renal/GU negative Renal ROS  negative genitourinary   Musculoskeletal negative musculoskeletal ROS (+) Arthritis ,   Abdominal   Peds negative pediatric ROS (+)  Hematology negative hematology ROS (+)   Anesthesia Other Findings   Reproductive/Obstetrics negative OB ROS                             Anesthesia Physical Anesthesia Plan  ASA: III  Anesthesia Plan: General   Post-op Pain Management:    Induction: Intravenous  Airway Management Planned: Oral ETT  Additional Equipment:   Intra-op Plan:   Post-operative Plan: Extubation in OR  Informed Consent: I have reviewed the patients History and Physical, chart, labs and discussed the procedure including the risks, benefits and alternatives for the proposed anesthesia with the patient or authorized representative who has indicated his/her understanding and acceptance.     Plan Discussed with: CRNA  Anesthesia Plan Comments: (amidate induction)        Anesthesia Quick Evaluation

## 2016-05-05 NOTE — Op Note (Signed)
[facility] Patient Name: Regina [name] Procedure Date: 05/05/2016 11:56 AM MRN: [id number] Date of Birth: April 04, 1923 Attending MD: Hildred Laser , MD CSN: [id number] Age: 82 Admit Type: Outpatient Procedure:                ERCP with Spyglass examination Indications:              For therapy of bile duct stone(s), Stent removal Providers:                Hildred Laser, MD, Rosina Lowenstein, RN, Isabella Stalling, Technician Referring MD:             Jasper Loser. Luan Pulling, MD Medicines:                General Anesthesia Complications:            No immediate complications. Estimated Blood Loss:      Procedure:                Pre-Anesthesia Assessment:                           - Prior to the procedure, a History and Physical                            was performed, and patient medications and                            allergies were reviewed. The patient's tolerance of                            previous anesthesia was also reviewed. The risks                            and benefits of the procedure and the sedation                            options and risks were discussed with the patient.                            All questions were answered, and informed consent                            was obtained. Prior Anticoagulants: The patient                            last took Eliquis (apixaban) 2 days prior to the                            procedure. ASA Grade Assessment: III - A patient                            with severe systemic disease. After reviewing the  risks and benefits, the patient was deemed in                            satisfactory condition to undergo the procedure.                           After obtaining informed consent, the scope was                            passed under direct vision. Throughout the                            procedure, the patient's blood pressure, pulse, and   oxygen saturations were monitored continuously. The                            [id number] [id number]) scope was introduced through                            the mouth, and used to inject contrast into and                            used for direct visualization of the bile, dorsal &                            ventral pancreatic ducts. The ERCP was technically                            difficult and complex due to challenging                            cannulation because of abnormal anatomy. The                            patient tolerated the procedure well. Scope In: 12:27:27 PM Scope Out: 1:22:46 PM Total Procedure Duration: 0 hours 55 minutes 19 seconds  Findings:      A biliary stent was visible on the scout film. One stent was removed       from the biliary tree using a snare. The stent was found to be patent.      Prior sphincterotomy was dilated with TTS balloon to 11 mm.      biliary system was diffusely dilated. Maximal CHD diaeter about 12 mm.      The biliary tree was swept with a 10-12 mm balloon and basket starting       at the bifurcation. Many stones were removed. No stones remained. Impression:               - Choledocholithiasis was found. Complete removal                            was accomplished by Basket and balloon extraction.                           - One stent was removed from the biliary tree.                           -  The biliary tree was swept. Moderate Sedation:      Per Anesthesia Care Recommendation:           - Discharge patient to home.                           - Continue present medications.                           - Resume Eliquis (apixaban) at prior dose tomorrow.                           - Clear liquid diet today.                           - Resume previous diet tomorrow.                           - Return to GI clinic in 2 months. Procedure Code(s):        --- Professional ---                           [phone], Endoscopic retrograde                             cholangiopancreatography (ERCP); with removal of                            foreign body(s) or stent(s) from biliary/pancreatic                            duct(s)                           [id number], Endoscopic retrograde                            cholangiopancreatography (ERCP); with removal of                            calculi/debris from biliary/pancreatic duct(s) Diagnosis Code(s):        --- Professional ---                           K80.50, Calculus of bile duct without cholangitis                            or cholecystitis without obstruction                           Z46.59, Encounter for fitting and adjustment of                            other gastrointestinal appliance and device CPT copyright 2016 [employer]. All rights reserved. The codes documented in this report are preliminary and upon coder review may  be revised to meet current compliance requirements. Hildred Laser, MD Hildred Laser, MD 05/05/2016 1:54:20 PM This report  has been signed electronically. Number of Addenda: 0

## 2016-05-08 ENCOUNTER — Encounter (INDEPENDENT_AMBULATORY_CARE_PROVIDER_SITE_OTHER): Payer: Self-pay | Admitting: Internal Medicine

## 2016-05-08 ENCOUNTER — Telehealth (INDEPENDENT_AMBULATORY_CARE_PROVIDER_SITE_OTHER): Payer: Self-pay | Admitting: *Deleted

## 2016-05-08 NOTE — Telephone Encounter (Signed)
Patient was given an appointment for 07/10/16 at 11:30am with Deberah Castle, NP.  A letter was mailed to the patient.

## 2016-05-08 NOTE — Telephone Encounter (Signed)
Per op note patient needs OV in 2 months

## 2016-05-10 DIAGNOSIS — Z23 Encounter for immunization: Secondary | ICD-10-CM | POA: Diagnosis not present

## 2016-05-12 ENCOUNTER — Encounter (HOSPITAL_COMMUNITY): Payer: Self-pay | Admitting: Internal Medicine

## 2016-05-16 ENCOUNTER — Ambulatory Visit (INDEPENDENT_AMBULATORY_CARE_PROVIDER_SITE_OTHER): Payer: Self-pay | Admitting: Internal Medicine

## 2016-07-10 ENCOUNTER — Ambulatory Visit (INDEPENDENT_AMBULATORY_CARE_PROVIDER_SITE_OTHER): Payer: Medicare Other | Admitting: Internal Medicine

## 2016-07-10 ENCOUNTER — Other Ambulatory Visit (INDEPENDENT_AMBULATORY_CARE_PROVIDER_SITE_OTHER): Payer: Self-pay | Admitting: Internal Medicine

## 2016-07-10 ENCOUNTER — Encounter (INDEPENDENT_AMBULATORY_CARE_PROVIDER_SITE_OTHER): Payer: Self-pay | Admitting: Internal Medicine

## 2016-07-10 VITALS — BP 120/90 | HR 60 | Temp 97.4°F | Ht 64.0 in | Wt 125.3 lb

## 2016-07-10 DIAGNOSIS — K8050 Calculus of bile duct without cholangitis or cholecystitis without obstruction: Secondary | ICD-10-CM

## 2016-07-10 NOTE — Patient Instructions (Signed)
OV in 1 year,.

## 2016-07-10 NOTE — Progress Notes (Signed)
Subjective:    Patient ID: Regina [name], female    DOB: August 03, 1922, 83 y.o.   MRN: [id number]  HPI Here today for f/u. She underwent an ERCP in December for bile duct stones.  Impression:               - Choledocholithiasis was found. Complete removal                            was accomplished by Basket and balloon extraction.                           - One stent was removed from the biliary tree.                           - The biliary tree was swept. In October she underwent an ERCP and found to have large CBD stone. All stone fragments were not removed. Stent left in place. She tells me she is doing good. No abdominal pain. She has a BM daily. No melena or BRRB. No nausea or vomiting. Appetite is okay. She has lost from 130 to 125.  She did take the flu shot.   Hepatic Function Panel     Component Value Date/Time   PROT 7.4 05/02/2016 1253   ALBUMIN 4.1 05/02/2016 1253   AST 22 05/02/2016 1253   ALT 12 (L) 05/02/2016 1253   ALKPHOS 81 05/02/2016 1253   BILITOT 0.8 05/02/2016 1253   BILIDIR 0.5 (H) 09/12/2012 1545   IBILI 0.6 09/12/2012 1545      Review of Systems Past Medical History:  Diagnosis Date  . Anemia   . Arthritis   . Avascular necrosis of bone of left hip ([place])   . Dysrhythmia 4/14   atrial fib post op  . H/O hiatal hernia   . History of blood transfusion   . Hypertension   . Hypothyroidism   . Stroke [facility]) 2003   No deficits    Past Surgical History:  Procedure Laterality Date  . BALLOON DILATION N/A 05/05/2016   Procedure: BALLOON DILATION OF SPHINCTEROTOMY;  Surgeon: Rogene Houston, MD;  [facility];  Service: Endoscopy;  Laterality: N/A;  . ERCP N/A 03/31/2016   Procedure: ENDOSCOPIC RETROGRADE CHOLANGIOPANCREATOGRAPHY (ERCP);  Surgeon: Rogene Houston, MD;  [facility];  Service: Endoscopy;  Laterality: N/A;  . ERCP N/A 05/05/2016   Procedure: ENDOSCOPIC RETROGRADE CHOLANGIOPANCREATOGRAPHY (ERCP);  Surgeon: Rogene Houston, MD;  [facility];  Service: Endoscopy;  Laterality: N/A;  do NOT move per [facility]  . EYE SURGERY Right   . GASTROINTESTINAL STENT REMOVAL N/A 05/05/2016   Procedure: Biliary stent removal.;  Surgeon: Rogene Houston, MD;  [facility]: [facility];  Service: Endoscopy;  Laterality: N/A;  . HIP PINNING,CANNULATED Left 09/13/2012   Procedure: ASNIS HIP PINNING LEFT;  Surgeon: Sanjuana Kava, MD;  Location: [facility];  Service: Orthopedics;  Laterality: Left;  . LITHOTRIPSY N/A 03/31/2016   Procedure: MECHANICAL LITHOTRIPSY;  Surgeon: Rogene Houston, MD;  [facility];  Service: Endoscopy;  Laterality: N/A;  . Mouth Sugery     Upper teeth impacted  . REMOVAL OF STONES N/A 03/31/2016   Procedure: PARTIAL RETRIEVAL OF STONE AND STENTING;  Surgeon: Rogene Houston, MD;  [facility];  Service: Endoscopy;  Laterality: N/A;  .  REMOVAL OF STONES N/A 05/05/2016   Procedure: REMOVAL OF STONES;  Surgeon: Rogene Houston, MD;  [facility];  Service: Endoscopy;  Laterality: N/A;  . SPHINCTEROTOMY N/A 03/31/2016   Procedure: SPHINCTEROTOMY;  Surgeon: Rogene Houston, MD;  [facility];  Service: Endoscopy;  Laterality: N/A;  . SPYGLASS CHOLANGIOSCOPY N/A 05/05/2016   Procedure: [id number] CHOLANGIOSCOPY;  Surgeon: Rogene Houston, MD;  [facility];  Service: Endoscopy;  Laterality: N/A;  . TOTAL HIP ARTHROPLASTY Left 10/03/2013   Procedure: LEFT TOTAL HIP ARTHROPLASTY ANTERIOR APPROACH and REMOVAL OF SCREWS LEFT HIP;  Surgeon: Mcarthur Rossetti, MD;  Location: [facility];  Service: Orthopedics;  Laterality: Left;    No Known Allergies  Current Outpatient Prescriptions on File Prior to Visit  Medication Sig Dispense Refill  . acetaminophen (TYLENOL) 650 MG CR tablet Take 650 mg by mouth every 8 (eight) hours as needed for pain.    Marland Kitchen apixaban (ELIQUIS) 2.5 MG TABS tablet Take 1 tablet (2.5 mg total) by mouth 2 (two) times daily. 60 tablet 6   . diltiazem (CARDIZEM CD) 120 MG 24 hr capsule Take 1 capsule (120 mg total) by mouth daily. 90 capsule 3  . levothyroxine (SYNTHROID, LEVOTHROID) 100 MCG tablet Take 100 mcg by mouth daily before breakfast.    . metoprolol tartrate (LOPRESSOR) 25 MG tablet take 1 tablet by mouth twice a day 180 tablet 3   No current facility-administered medications on file prior to visit.        Objective:   Physical Exam Blood pressure 120/90, pulse 60, temperature 97.4 F (36.3 C), height 5\' 4"  (1.626 m), weight 125 lb 4.8 oz (56.8 kg).  Alert and oriented. Skin warm and dry. Oral mucosa is moist.   . Sclera anicteric, conjunctivae is pink. Thyroid not enlarged. No cervical lymphadenopathy. Lungs clear. Heart regular rate and rhythm.  Abdomen is soft. Bowel sounds are positive. No hepatomegaly. No abdominal masses felt. No tenderness.  No edema to lower extremities.         Assessment & Plan:  Choledocholithiasis. She is doing well.  No complaints. Hepatic function today. OV one year.

## 2016-07-11 LAB — HEPATIC FUNCTION PANEL
ALK PHOS: 88 IU/L (ref 39–117)
ALT: 9 IU/L (ref 0–32)
AST: 17 IU/L (ref 0–40)
Albumin: 4.1 g/dL (ref 3.2–4.6)
BILIRUBIN, DIRECT: 0.16 mg/dL (ref 0.00–0.40)
Bilirubin Total: 0.6 mg/dL (ref 0.0–1.2)
Total Protein: 7.2 g/dL (ref 6.0–8.5)

## 2016-08-30 DIAGNOSIS — R21 Rash and other nonspecific skin eruption: Secondary | ICD-10-CM | POA: Diagnosis not present

## 2016-08-30 DIAGNOSIS — M25552 Pain in left hip: Secondary | ICD-10-CM | POA: Diagnosis not present

## 2016-08-30 DIAGNOSIS — I482 Chronic atrial fibrillation: Secondary | ICD-10-CM | POA: Diagnosis not present

## 2016-08-30 DIAGNOSIS — I10 Essential (primary) hypertension: Secondary | ICD-10-CM | POA: Diagnosis not present

## 2016-10-01 ENCOUNTER — Other Ambulatory Visit: Payer: Self-pay | Admitting: Cardiovascular Disease

## 2016-10-14 ENCOUNTER — Other Ambulatory Visit: Payer: Self-pay | Admitting: Cardiovascular Disease

## 2016-11-07 DIAGNOSIS — R5383 Other fatigue: Secondary | ICD-10-CM | POA: Diagnosis not present

## 2016-11-10 ENCOUNTER — Encounter (HOSPITAL_COMMUNITY): Payer: Self-pay | Admitting: Emergency Medicine

## 2016-11-10 ENCOUNTER — Other Ambulatory Visit (HOSPITAL_COMMUNITY): Payer: Self-pay

## 2016-11-10 ENCOUNTER — Inpatient Hospital Stay (HOSPITAL_COMMUNITY): Payer: Medicare Other

## 2016-11-10 ENCOUNTER — Emergency Department (HOSPITAL_COMMUNITY): Payer: Medicare Other

## 2016-11-10 ENCOUNTER — Inpatient Hospital Stay (HOSPITAL_COMMUNITY)
Admission: EM | Admit: 2016-11-10 | Discharge: 2016-11-13 | DRG: 444 | Disposition: A | Discharge status: Home / Self Care | Payer: Medicare Other | Attending: Family Medicine | Admitting: Family Medicine

## 2016-11-10 DIAGNOSIS — R531 Weakness: Secondary | ICD-10-CM

## 2016-11-10 DIAGNOSIS — K219 Gastro-esophageal reflux disease without esophagitis: Secondary | ICD-10-CM | POA: Diagnosis present

## 2016-11-10 DIAGNOSIS — Z8249 Family history of ischemic heart disease and other diseases of the circulatory system: Secondary | ICD-10-CM

## 2016-11-10 DIAGNOSIS — Z809 Family history of malignant neoplasm, unspecified: Secondary | ICD-10-CM | POA: Diagnosis not present

## 2016-11-10 DIAGNOSIS — N390 Urinary tract infection, site not specified: Secondary | ICD-10-CM | POA: Diagnosis present

## 2016-11-10 DIAGNOSIS — K8070 Calculus of gallbladder and bile duct without cholecystitis without obstruction: Secondary | ICD-10-CM | POA: Diagnosis present

## 2016-11-10 DIAGNOSIS — K8309 Other cholangitis: Secondary | ICD-10-CM

## 2016-11-10 DIAGNOSIS — K8050 Calculus of bile duct without cholangitis or cholecystitis without obstruction: Secondary | ICD-10-CM | POA: Diagnosis not present

## 2016-11-10 DIAGNOSIS — E860 Dehydration: Secondary | ICD-10-CM | POA: Diagnosis present

## 2016-11-10 DIAGNOSIS — G9340 Encephalopathy, unspecified: Secondary | ICD-10-CM | POA: Diagnosis present

## 2016-11-10 DIAGNOSIS — D649 Anemia, unspecified: Secondary | ICD-10-CM | POA: Diagnosis present

## 2016-11-10 DIAGNOSIS — Z7901 Long term (current) use of anticoagulants: Secondary | ICD-10-CM | POA: Diagnosis not present

## 2016-11-10 DIAGNOSIS — I10 Essential (primary) hypertension: Secondary | ICD-10-CM | POA: Diagnosis present

## 2016-11-10 DIAGNOSIS — K8030 Calculus of bile duct with cholangitis, unspecified, without obstruction: Secondary | ICD-10-CM | POA: Diagnosis present

## 2016-11-10 DIAGNOSIS — R1011 Right upper quadrant pain: Secondary | ICD-10-CM

## 2016-11-10 DIAGNOSIS — K830 Cholangitis: Secondary | ICD-10-CM | POA: Diagnosis not present

## 2016-11-10 DIAGNOSIS — K838 Other specified diseases of biliary tract: Secondary | ICD-10-CM | POA: Diagnosis not present

## 2016-11-10 DIAGNOSIS — N179 Acute kidney failure, unspecified: Secondary | ICD-10-CM | POA: Diagnosis present

## 2016-11-10 DIAGNOSIS — K449 Diaphragmatic hernia without obstruction or gangrene: Secondary | ICD-10-CM | POA: Diagnosis present

## 2016-11-10 DIAGNOSIS — Z8673 Personal history of transient ischemic attack (TIA), and cerebral infarction without residual deficits: Secondary | ICD-10-CM

## 2016-11-10 DIAGNOSIS — R932 Abnormal findings on diagnostic imaging of liver and biliary tract: Secondary | ICD-10-CM | POA: Diagnosis not present

## 2016-11-10 DIAGNOSIS — R945 Abnormal results of liver function studies: Secondary | ICD-10-CM | POA: Diagnosis not present

## 2016-11-10 DIAGNOSIS — Z96642 Presence of left artificial hip joint: Secondary | ICD-10-CM | POA: Diagnosis present

## 2016-11-10 DIAGNOSIS — K8020 Calculus of gallbladder without cholecystitis without obstruction: Secondary | ICD-10-CM | POA: Diagnosis not present

## 2016-11-10 DIAGNOSIS — K8051 Calculus of bile duct without cholangitis or cholecystitis with obstruction: Secondary | ICD-10-CM | POA: Diagnosis not present

## 2016-11-10 DIAGNOSIS — I517 Cardiomegaly: Secondary | ICD-10-CM | POA: Diagnosis not present

## 2016-11-10 DIAGNOSIS — R17 Unspecified jaundice: Secondary | ICD-10-CM | POA: Diagnosis not present

## 2016-11-10 DIAGNOSIS — E039 Hypothyroidism, unspecified: Secondary | ICD-10-CM | POA: Diagnosis present

## 2016-11-10 DIAGNOSIS — E871 Hypo-osmolality and hyponatremia: Secondary | ICD-10-CM | POA: Diagnosis not present

## 2016-11-10 DIAGNOSIS — I482 Chronic atrial fibrillation: Secondary | ICD-10-CM | POA: Diagnosis present

## 2016-11-10 LAB — APTT: APTT: 38 s — AB (ref 24–36)

## 2016-11-10 LAB — URINALYSIS, ROUTINE W REFLEX MICROSCOPIC
GLUCOSE, UA: NEGATIVE mg/dL
Ketones, ur: NEGATIVE mg/dL
LEUKOCYTES UA: NEGATIVE
NITRITE: NEGATIVE
PH: 5 (ref 5.0–8.0)
PROTEIN: 100 mg/dL — AB
SPECIFIC GRAVITY, URINE: 1.027 (ref 1.005–1.030)

## 2016-11-10 LAB — COMPREHENSIVE METABOLIC PANEL
ALK PHOS: 843 U/L — AB (ref 38–126)
ALT: 147 U/L — AB (ref 14–54)
AST: 169 U/L — AB (ref 15–41)
Albumin: 2.7 g/dL — ABNORMAL LOW (ref 3.5–5.0)
Anion gap: 10 (ref 5–15)
BILIRUBIN TOTAL: 20.5 mg/dL — AB (ref 0.3–1.2)
BUN: 16 mg/dL (ref 6–20)
CALCIUM: 8.7 mg/dL — AB (ref 8.9–10.3)
CO2: 24 mmol/L (ref 22–32)
CREATININE: 1.35 mg/dL — AB (ref 0.44–1.00)
Chloride: 96 mmol/L — ABNORMAL LOW (ref 101–111)
GFR calc Af Amer: 39 mL/min — ABNORMAL LOW (ref >60)
GFR calc non Af Amer: 33 mL/min — ABNORMAL LOW (ref >60)
GLUCOSE: 129 mg/dL — AB (ref 65–99)
Potassium: 4.8 mmol/L (ref 3.5–5.1)
Sodium: 130 mmol/L — ABNORMAL LOW (ref 135–145)
TOTAL PROTEIN: 7.5 g/dL (ref 6.5–8.1)

## 2016-11-10 LAB — CBC WITH DIFFERENTIAL/PLATELET
BASOS PCT: 0 %
Basophils Absolute: 0 10*3/uL (ref 0.0–0.1)
Eosinophils Absolute: 0 10*3/uL (ref 0.0–0.7)
Eosinophils Relative: 0 %
HEMATOCRIT: 37.9 % (ref 36.0–46.0)
HEMOGLOBIN: 13.3 g/dL (ref 12.0–15.0)
Lymphocytes Relative: 5 %
Lymphs Abs: 0.9 10*3/uL (ref 0.7–4.0)
MCH: 30.2 pg (ref 26.0–34.0)
MCHC: 35.1 g/dL (ref 30.0–36.0)
MCV: 85.9 fL (ref 78.0–100.0)
MONOS PCT: 8 %
Monocytes Absolute: 1.2 10*3/uL — ABNORMAL HIGH (ref 0.1–1.0)
NEUTROS ABS: 14.2 10*3/uL — AB (ref 1.7–7.7)
NEUTROS PCT: 87 %
Platelets: 291 10*3/uL (ref 150–400)
RBC: 4.41 MIL/uL (ref 3.87–5.11)
RDW: 14.5 % (ref 11.5–15.5)
WBC: 16.4 10*3/uL — ABNORMAL HIGH (ref 4.0–10.5)

## 2016-11-10 LAB — HEPARIN LEVEL (UNFRACTIONATED)

## 2016-11-10 LAB — LIPASE, BLOOD: Lipase: 21 U/L (ref 11–51)

## 2016-11-10 LAB — BILIRUBIN, FRACTIONATED(TOT/DIR/INDIR)
BILIRUBIN DIRECT: 12.1 mg/dL — AB (ref 0.1–0.5)
BILIRUBIN INDIRECT: 7.5 mg/dL — AB (ref 0.3–0.9)
Total Bilirubin: 19.6 mg/dL (ref 0.3–1.2)

## 2016-11-10 LAB — PROTIME-INR
INR: 1.49
Prothrombin Time: 18.2 seconds — ABNORMAL HIGH (ref 11.4–15.2)

## 2016-11-10 LAB — GAMMA GT: GGT: 985 U/L — ABNORMAL HIGH (ref 7–50)

## 2016-11-10 LAB — TSH: TSH: 2.308 u[IU]/mL (ref 0.350–4.500)

## 2016-11-10 LAB — AMMONIA: Ammonia: 19 umol/L (ref 9–35)

## 2016-11-10 MED ORDER — PANTOPRAZOLE SODIUM 40 MG PO TBEC
40.0000 mg | DELAYED_RELEASE_TABLET | Freq: Every day | ORAL | Status: DC
  Administered 2016-11-10 – 2016-11-13 (×3): 40 mg via ORAL
  Filled 2016-11-10 (×3): qty 1

## 2016-11-10 MED ORDER — SODIUM CHLORIDE 0.9 % IV BOLUS (SEPSIS)
500.0000 mL | Freq: Once | INTRAVENOUS | Status: AC
  Administered 2016-11-10: 500 mL via INTRAVENOUS

## 2016-11-10 MED ORDER — PIPERACILLIN-TAZOBACTAM 3.375 G IVPB 30 MIN
3.3750 g | Freq: Once | INTRAVENOUS | Status: AC
  Administered 2016-11-10: 3.375 g via INTRAVENOUS
  Filled 2016-11-10: qty 50

## 2016-11-10 MED ORDER — LEVOTHYROXINE SODIUM 100 MCG PO TABS
100.0000 ug | ORAL_TABLET | Freq: Every day | ORAL | Status: DC
  Administered 2016-11-11 – 2016-11-13 (×3): 100 ug via ORAL
  Filled 2016-11-10 (×3): qty 1

## 2016-11-10 MED ORDER — PIPERACILLIN-TAZOBACTAM 3.375 G IVPB
3.3750 g | Freq: Three times a day (TID) | INTRAVENOUS | Status: DC
  Administered 2016-11-10 – 2016-11-13 (×7): 3.375 g via INTRAVENOUS
  Filled 2016-11-10 (×9): qty 50

## 2016-11-10 MED ORDER — SODIUM CHLORIDE 0.9 % IV SOLN
INTRAVENOUS | Status: DC
  Administered 2016-11-10 – 2016-11-11 (×2): via INTRAVENOUS
  Administered 2016-11-11: 1 mL via INTRAVENOUS
  Administered 2016-11-11: 13:00:00 via INTRAVENOUS
  Administered 2016-11-12: 1 mL via INTRAVENOUS
  Administered 2016-11-12: 04:00:00 via INTRAVENOUS

## 2016-11-10 MED ORDER — SODIUM CHLORIDE 0.9% FLUSH
3.0000 mL | Freq: Two times a day (BID) | INTRAVENOUS | Status: DC
  Administered 2016-11-10 – 2016-11-13 (×5): 3 mL via INTRAVENOUS

## 2016-11-10 MED ORDER — HEPARIN (PORCINE) IN NACL 100-0.45 UNIT/ML-% IJ SOLN
850.0000 [IU]/h | INTRAMUSCULAR | Status: DC
  Administered 2016-11-10: 850 [IU]/h via INTRAVENOUS
  Filled 2016-11-10: qty 250

## 2016-11-10 MED ORDER — DILTIAZEM HCL ER COATED BEADS 120 MG PO CP24
120.0000 mg | ORAL_CAPSULE | Freq: Every day | ORAL | Status: DC
  Administered 2016-11-12 – 2016-11-13 (×2): 120 mg via ORAL
  Filled 2016-11-10 (×3): qty 1

## 2016-11-10 MED ORDER — METOPROLOL TARTRATE 25 MG PO TABS
25.0000 mg | ORAL_TABLET | Freq: Two times a day (BID) | ORAL | Status: DC
  Administered 2016-11-10 – 2016-11-13 (×5): 25 mg via ORAL
  Filled 2016-11-10 (×6): qty 1

## 2016-11-10 NOTE — Progress Notes (Addendum)
ANTICOAGULATION CONSULT NOTE - Initial Consult  Pharmacy Consult for heparin (PTA apixaban on hold) Indication: atrial fibrillation  No Known Allergies  Patient Measurements: Height: 5\' 4"  (162.6 cm) Weight: 125 lb (56.7 kg) IBW/kg (Calculated) : 54.7 Heparin Dosing Weight: 57 kg   Vital Signs: Temp: 99 F (37.2 C) (06/08 1920) Temp Source: Oral (06/08 1920) BP: 125/58 (06/08 1830) Pulse Rate: 80 (06/08 1845)  Labs:  Recent Labs  11/10/16 1529  HGB 13.3  HCT 37.9  PLT 291  CREATININE 1.35*    Estimated Creatinine Clearance: 23.4 mL/min (A) (by C-G formula based on SCr of 1.35 mg/dL (H)).   Medical History: Past Medical History:  Diagnosis Date  . Anemia   . Arthritis   . Avascular necrosis of bone of left hip ([place])   . Dysrhythmia 4/14   atrial fib post op  . H/O hiatal hernia   . History of blood transfusion   . Hypertension   . Hypothyroidism   . Stroke [facility]) 2003   No deficits   Assessment: 83 yo female admitted with jaundice. History of afib and on apixaban PTA. Pharmacy consulted to dose heparin while apixaban on hold. Last dose of apixaban 6/8 at 0830. CBC stable and no overt s/s bleeding noted. Will initiate heparin gtt at 2100 without bolus, after baseline aPTT and Xa drawn. Will follow aPTT and heparin levels until correlating.   Goal of Therapy:  Heparin level 0.3-0.7 units/ml  aPTT 66-102s Monitor platelets by anticoagulation protocol: Yes   Plan:  Start heparin gtt at 850 units/hr Heparin level/aPTT with morning labs Daily heparin level/aPTT and CBC Monitor for s/s bleeding F/u oral [facility] plan   Argie Ramming, PharmD Pharmacy Resident  Pager [phone] 11/10/16 7:43 PM

## 2016-11-10 NOTE — ED Triage Notes (Signed)
Pt here with increasing jaundice and elevated liver levels per family; pt has hx of same with stone removal

## 2016-11-10 NOTE — ED Notes (Signed)
hospitalist at the bedside 

## 2016-11-10 NOTE — H&P (Signed)
[facility] Admission History and Physical Service Pager: [phone]  Patient name: Regina [name] Medical record number: [id number] Date of birth: 05-11-1923 Age: 83 y.o. Gender: female  Primary Care Provider: Sinda Du, MD Consultants: Gastroenterology Code Status: Full   Chief Complaint: Jaundice   Assessment and Plan: Regina [name] is a 83 y.o. female with a past medical history significant for atrial fibrillation, hypothyroidism, choledocholithiasis s/p ERCP (Oct 2017), AVN left hip s/p replacement, hypertension, hiatal hernia and stroke who presented with generalized jaundice in the the setting of elevated liver labs concerning for cholestasis.  #Jaundice and transaminitis, acute Patient presents with significant generalized jaundice. Patient with a recent history of choledocholithiasis with ERCP (03/2016) with subsequent stent placement and removal in December 2017. Patient reports worsening jaundice in the past week. Patient also reports pale stools. On admission, patient was initially afebrile but was later found to be febrile to 100.45F with elevated WBCs to 16.4, patient mildly tachycardic and tachypneic at times but stable. Patient reports chills at home but no fever. Alkaline phosphatase 843, AST 169, ALT 147, total bili 20.5. Presentation consistent with biliary obstruction most likely choledocholithiasis, though patient have fever, and chills with leukocytosis that could be concerning for cholangitis. Given patient clinical presentation, no abdominal pain, and stable vitals signs prior to admission to the floor the likelihood for infectious process is low. Lipase is normal less concerning for pancreatic etiology I.e. Pancreatic cancer. Patient could also have hepatitis though transaminases are only mildly elevated. Given age, liver malignancy is also on our differential. Would consider hepatitis panel for a more complete evaluation of  transaminitis. --Will admit to [facility], admitting physician Dr.Hensel --Continue Zosyn 3.375 mg --Follow up GI recs, consulted in ED --Follow up on GGT  --Added fractionated bili --Follow on RUQ [place]  --Follow up am CMP and CBC --Zofran 4 mg prn  --Tramadol 50 mg prn  --PT/OT  #Acute Kidney Injury On admission, patient creatinine is 1.35. Baseline is (0.8-0.9). Patient endorses poor po intake in he past two weeks. Most likely pre renal in etiology. Specific gravity on UA 1.027 consistent with dehydration. Patient with granular casts, proteinuria and hemoglobin in urine consistent with some degree of kidney disease. --Start NS 75 cc/hr --Follow up on am CMP  #Urinary tract infection, acute On Admission, patient reports recent diagnosis of UTI early this week, after increase frequency and hesitancy but denies dysuria. Patient started on amoxicillin on Tuesday (6/5). On admission, UA with rare bacteria , no leuk, no nitrite low suspicion for UTI. Given multiple squamous, likely need recollection. Patient also reported initially, back and right sided flank pain in setting of UTI suspicious for possible pyelonephritis. However, no CVA tenderness on admission. Patient currently on Zosyn. --Continue Zosyn 3.375 mg  --Monitor for signs and symptoms --Consider further imaging if suspicious of pyelonephritis. Renal US if no improvement clinically --Urine Culture  #AMS, Fatigue and weakness, acute, stable  Patient reports increased sleepiness, confusion and fatigue likely secondary to failing liver with a component of encephalopathy. Patient has not been able to tolerate much po in the few days which also will play a role in increase fatigue and weakness. In addition, patient was hyponatremic on admission which would also play a contributing role. On admission, patient is alert and oriented, coherent in thought process and able to answer question appropriately. --Follow on ammonia level --Monitor mental  status --IV fluids as need  #Hyponatremia, acute  On admission  Na+ is 130, likely secondary  to poor intake in the past few days leading to admission with some gastric loss 2/2 to emesis and renal loss 2/2 increase urinary frequency in the setting of UTI. Patient has normal glucose and protein level. --Will correct with IVF (NS) --Follow up on am CMP  #Atrial fibrillation, chronic, stable Patient diagnosed with atrial fibrillation in 2015 on Eliquis. Patient is rate controlled but rhythm is uncontrolled as noted on initial EKG and cardiac monitoring. Patient has a history of stroke, but no current concern for emboli event during this admission despite reported AMS. --Started heparin gtt but GI wanted it off for ERCP --Continue diltiazem 120 mg daily  --Continue metoprolol 25 mg bid --Follow up on am EKG  #Hypertension On admission, BP 122/56. Patient within normal limits.  --Will continue to monitor --Continue Metoprolol 25 mg   #Hypothyroidism Appears to be well controlled on home regimen --Continue levothyroxine 100 mcg daily --Follow up on TSH  #GERD --Continue Protonix 40 mg daily   FEN/GI: NPO w/ sips with meds, NS 75 cc/hr  Prophylaxis: Heparin off, start SCD.    Disposition: Admit for further GI work up and clinical improvement   History of Present Illness:   Regina [name] is a 83 y.o. female with a past medical history significant for atrial fibrillation, hypothyroidism, choledocholithiasis s/p ERCP (Oct 2017), AVN left hip s/p replacement, hypertension, hiatal hernia and stroke who presented to the ED after she was informed by her PCP that she had abnormally elevated liver function. Patient reports that she has not been feeling well since Mother's day (May 13) when she developed right sided flank pain/back. Patient continue to feel weaker, more fatigued and sleepier. Patient reported increase urinary frequency and hesitancy but no dysuria. She also endorses anorexia,  and metallic taste in her mouth. This past Monday patient was seen by PCP and was diagnosed with a urinary tract infection and was started on amoxicillin on Tuesday. Additional bloodwork was done during visit. Family member report worsening of her jaundice throughout the week. Today, they were informed by their PCP that patient had markedly elevated liver function labs and that they needed to go to [facility] since Dr. Laural Golden their Gastroenterologist in [place] was not available. Patient denies chest pain, pruritis, SOB, abdominal pain, diarrhea, dizziness, fall and easy bruising. In the ED patient was started on Zosyn after she was found to have leukocytosis to 16.4, low grade fever 100.33F, and mildly tachycardic. CXR was was essentially normal except for known large Hiatal Henia. Given significant jaundice and history of choledocholithiasis GI was consulted. FMTS was consulted as well for admission and furhther management of patient. Of note patient had ERCP for choledocholithiasis in October 2017 with stent placement after failure to extract stone in the common hepatic duct measuring 16 mm in diameter. Lithotripsy was performed fragment of the stone was removed, stent was placed in CBD to allow for flow. Stone eventually extracted and stent was removed without complications.   Review Of Systems: Per HPI with the following additions:  Review of Systems  Constitutional: Positive for chills. Negative for fever.  Eyes: Negative.   Respiratory: Negative.   Cardiovascular: Negative for chest pain.  Gastrointestinal: Positive for heartburn and nausea. Negative for abdominal pain, blood in stool, constipation, diarrhea, melena and vomiting.  Genitourinary: Positive for frequency. Negative for dysuria and flank pain.  Skin: Negative for itching.  Neurological: Positive for weakness. Negative for dizziness and headaches.  Endo/Heme/Allergies: Does not bruise/bleed easily.  Psychiatric/Behavioral:  Negative.  Patient Active Problem List   Diagnosis Date Noted  . Jaundice 11/10/2016  . Common bile duct stone 04/10/2016  . Choledocholithiasis 02/08/2016  . Surgical wound infection 10/17/2013  . Acute blood loss anemia 10/09/2013  . Constipation 10/09/2013  . Avascular necrosis of bone of left hip ([place]) 10/03/2013  . Status post THR (total hip replacement) 10/03/2013  . Atrial fibrillation ([place]) 09/14/2012  . Hypokalemia 09/14/2012  . Fracture of femoral neck, left ([place]) 09/13/2012  . Essential hypertension, benign 09/13/2012  . Unspecified hypothyroidism 09/13/2012    Past Medical History: Past Medical History:  Diagnosis Date  . Anemia   . Arthritis   . Avascular necrosis of bone of left hip ([place])   . Dysrhythmia 4/14   atrial fib post op  . H/O hiatal hernia   . History of blood transfusion   . Hypertension   . Hypothyroidism   . Stroke [facility]) 2003   No deficits    Past Surgical History: Past Surgical History:  Procedure Laterality Date  . BALLOON DILATION N/A 05/05/2016   Procedure: BALLOON DILATION OF SPHINCTEROTOMY;  Surgeon: Rogene Houston, MD;  [facility];  Service: Endoscopy;  Laterality: N/A;  . ERCP N/A 03/31/2016   Procedure: ENDOSCOPIC RETROGRADE CHOLANGIOPANCREATOGRAPHY (ERCP);  Surgeon: Rogene Houston, MD;  [facility];  Service: Endoscopy;  Laterality: N/A;  . ERCP N/A 05/05/2016   Procedure: ENDOSCOPIC RETROGRADE CHOLANGIOPANCREATOGRAPHY (ERCP);  Surgeon: Rogene Houston, MD;  [facility];  Service: Endoscopy;  Laterality: N/A;  do NOT move per [facility]  . EYE SURGERY Right   . GASTROINTESTINAL STENT REMOVAL N/A 05/05/2016   Procedure: Biliary stent removal.;  Surgeon: Rogene Houston, MD;  [facility];  Service: Endoscopy;  Laterality: N/A;  . HIP PINNING,CANNULATED Left 09/13/2012   Procedure: ASNIS HIP PINNING LEFT;  Surgeon: Sanjuana Kava, MD;  Location: [facility];  Service: Orthopedics;  Laterality:  Left;  . LITHOTRIPSY N/A 03/31/2016   Procedure: MECHANICAL LITHOTRIPSY;  Surgeon: Rogene Houston, MD;  [facility];  Service: Endoscopy;  Laterality: N/A;  . Mouth Sugery     Upper teeth impacted  . REMOVAL OF STONES N/A 03/31/2016   Procedure: PARTIAL RETRIEVAL OF STONE AND STENTING;  Surgeon: Rogene Houston, MD;  [facility];  Service: Endoscopy;  Laterality: N/A;  . REMOVAL OF STONES N/A 05/05/2016   Procedure: REMOVAL OF STONES;  Surgeon: Rogene Houston, MD;  [facility];  Service: Endoscopy;  Laterality: N/A;  . SPHINCTEROTOMY N/A 03/31/2016   Procedure: SPHINCTEROTOMY;  Surgeon: Rogene Houston, MD;  [facility];  Service: Endoscopy;  Laterality: N/A;  . SPYGLASS CHOLANGIOSCOPY N/A 05/05/2016   Procedure: XIPJASNK CHOLANGIOSCOPY;  Surgeon: Rogene Houston, MD;  [facility];  Service: Endoscopy;  Laterality: N/A;  . TOTAL HIP ARTHROPLASTY Left 10/03/2013   Procedure: LEFT TOTAL HIP ARTHROPLASTY ANTERIOR APPROACH and REMOVAL OF SCREWS LEFT HIP;  Surgeon: Mcarthur Rossetti, MD;  Location: [facility];  Service: Orthopedics;  Laterality: Left;    Social History: Social History  Substance Use Topics  . Smoking status: Never Smoker  . Smokeless tobacco: Never Used     Comment: Smoked a few times in college (socially)  . Alcohol use No     Comment: None since Christmas 2013   Additional social history:  Please also refer to relevant sections of EMR.  Family History: Family History  Problem Relation Age of  Onset  . Heart attack Mother   . Heart attack Father   . Cancer Brother    (If not completed, MUST add something in)  Allergies and Medications: No Known Allergies No current facility-administered medications on file prior to encounter.    Current Outpatient Prescriptions on File Prior to Encounter  Medication Sig Dispense Refill  . apixaban (ELIQUIS) 2.5 MG TABS tablet Take 1 tablet (2.5 mg total) by mouth 2  (two) times daily. 60 tablet 6  . diltiazem (CARDIZEM CD) 120 MG 24 hr capsule take 1 capsule by mouth once daily 90 capsule 2  . levothyroxine (SYNTHROID, LEVOTHROID) 100 MCG tablet Take 100 mcg by mouth daily.     . metoprolol tartrate (LOPRESSOR) 25 MG tablet take 1 tablet twice a day 60 tablet 0  . metoprolol tartrate (LOPRESSOR) 25 MG tablet take 1 tablet by mouth twice a day (Patient not taking: Reported on 11/10/2016) 180 tablet 3    Objective: BP (!) 125/58   Pulse (!) 45   Temp (!) 100.6 F (38.1 C) (Rectal)   Resp (!) 25   Ht _0  (1.626 m)   Wt 125 lb (56.7 kg)   SpO2 97%   BMI 21.46 kg/m    Physical Exam:  General:Elderly woman, in no acute distress, pleasant, able to participate in exam Cardiac: irregular rhythm, normal heart sounds, no murmurs. 2+ radial and PT pulses bilaterally Respiratory: CTAB, normal effort, No wheezes, rales or rhonchi Abdomen: soft, nontender, nondistended, no hepatic or splenomegaly, +BS GU: no suprapubic or CVA tenderness Extremities: no edema or cyanosis. WWP. Skin: Generalized jaundice, warm and dry, no rashes noted,  no petechia , or ecchymosis noted on exam Neuro: alert and oriented x4, no focal deficits Psych: Normal affect and mood   Labs and Imaging: CBC BMET   Recent Labs Lab 11/10/16 1529  WBC 16.4*  HGB 13.3  HCT 37.9  PLT 291    Recent Labs Lab 11/10/16 1529  NA 130*  K 4.8  CL 96*  CO2 24  BUN 16  CREATININE 1.35*  GLUCOSE 129*  CALCIUM 8.7*    AST: 169 ALT: 147 Alk Phos: 843 T.bili: 20.5  Dg Chest Port 1 View  Result Date: 11/10/2016 CLINICAL DATA:  Weakness.  Jaundice EXAM: PORTABLE CHEST 1 VIEW COMPARISON:  09/25/2013 FINDINGS: Chronic cardiomegaly and large hiatal hernia. Accounting for artifact there is no convincing consolidation. No edema, effusion, or pneumothorax. Remote and healed proximal left humerus fracture. IMPRESSION: 1. No acute finding. 2. Chronic cardiomegaly and large hiatal hernia.  Electronically Signed   By: Monte Fantasia M.D.   On: 11/10/2016 16:09     Marjie Skiff, MD 11/10/2016, 7:07 PM PGY-1, [place] Intern pager: [phone], text pages welcome  I have seen and evaluated the patient with Dr. Andy Gauss. I am in agreement with the note above in its revised form. My additions are in red.  Wendee Beavers, MD, PGY-2 11/11/2016 6:50 AM

## 2016-11-10 NOTE — ED Provider Notes (Signed)
[facility] DEPT Provider Note   CSN: [id number] Arrival date & time: 11/10/16  1430     History   Chief Complaint Chief Complaint  Patient presents with  . Abnormal Lab    HPI Chrisha A Shadoan is a 83 y.o. female.  HPI 83 year old female who lives alone, history of renal cholelithiasis status post stent, anemia, A. fib on eliquis, hiatal hernia, hypertension, stroke presents today with complaints of skin turning yellow. She was seen by her primary care doctor, Dr. Luan Pulling, earlier this week. They were called and told her liver function tests were very elevated. She reports that she had been greatly improved since her last procedure in December until about a week ago. She began losing her appetite and has had nausea but not vomiting. Her stools have become pale in color. Is started on amoxicillin earlier this week as she was told that she had a urinary tract infection. They were called today and told that her LFTs were elevated and she needed to be seen, but Dr. Laural Golden was not available. Subsequently, she was sent to [facility] for further evaluation. Past Medical History:  Diagnosis Date  . Anemia   . Arthritis   . Avascular necrosis of bone of left hip ([place])   . Dysrhythmia 4/14   atrial fib post op  . H/O hiatal hernia   . History of blood transfusion   . Hypertension   . Hypothyroidism   . Stroke [facility]) 2003   No deficits    Patient Active Problem List   Diagnosis Date Noted  . Common bile duct stone 04/10/2016  . Choledocholithiasis 02/08/2016  . Surgical wound infection 10/17/2013  . Acute blood loss anemia 10/09/2013  . Constipation 10/09/2013  . Avascular necrosis of bone of left hip ([place]) 10/03/2013  . Status post THR (total hip replacement) 10/03/2013  . Atrial fibrillation ([place]) 09/14/2012  . Hypokalemia 09/14/2012  . Fracture of femoral neck, left ([place]) 09/13/2012  . Essential hypertension, benign 09/13/2012  . Unspecified hypothyroidism 09/13/2012     Past Surgical History:  Procedure Laterality Date  . BALLOON DILATION N/A 05/05/2016   Procedure: BALLOON DILATION OF SPHINCTEROTOMY;  Surgeon: Rogene Houston, MD;  [facility];  Service: Endoscopy;  Laterality: N/A;  . ERCP N/A 03/31/2016   Procedure: ENDOSCOPIC RETROGRADE CHOLANGIOPANCREATOGRAPHY (ERCP);  Surgeon: Rogene Houston, MD;  [facility];  Service: Endoscopy;  Laterality: N/A;  . ERCP N/A 05/05/2016   Procedure: ENDOSCOPIC RETROGRADE CHOLANGIOPANCREATOGRAPHY (ERCP);  Surgeon: Rogene Houston, MD;  [facility];  Service: Endoscopy;  Laterality: N/A;  do NOT move per [facility]  . EYE SURGERY Right   . GASTROINTESTINAL STENT REMOVAL N/A 05/05/2016   Procedure: Biliary stent removal.;  Surgeon: Rogene Houston, MD;  [facility];  Service: Endoscopy;  Laterality: N/A;  . HIP PINNING,CANNULATED Left 09/13/2012   Procedure: ASNIS HIP PINNING LEFT;  Surgeon: Sanjuana Kava, MD;  Location: [facility] ORS;  Service: Orthopedics;  Laterality: Left;  . LITHOTRIPSY N/A 03/31/2016   Procedure: MECHANICAL LITHOTRIPSY;  Surgeon: Rogene Houston, MD;  [facility];  Service: Endoscopy;  Laterality: N/A;  . Mouth Sugery     Upper teeth impacted  . REMOVAL OF STONES N/A 03/31/2016   Procedure: PARTIAL RETRIEVAL OF STONE AND STENTING;  Surgeon: Rogene Houston, MD;  [facility];  Service: Endoscopy;  Laterality: N/A;  . REMOVAL OF STONES N/A 05/05/2016   Procedure: REMOVAL OF STONES;  Surgeon: Rogene Houston, MD;  [facility];  Service: Endoscopy;  Laterality: N/A;  . SPHINCTEROTOMY N/A 03/31/2016   Procedure: SPHINCTEROTOMY;  Surgeon: Rogene Houston, MD;  [facility];  Service: Endoscopy;  Laterality: N/A;  . SPYGLASS CHOLANGIOSCOPY N/A 05/05/2016   Procedure: ESPQZRAQ CHOLANGIOSCOPY;  Surgeon: Rogene Houston, MD;  [facility];  Service: Endoscopy;  Laterality: N/A;  . TOTAL HIP ARTHROPLASTY Left  10/03/2013   Procedure: LEFT TOTAL HIP ARTHROPLASTY ANTERIOR APPROACH and REMOVAL OF SCREWS LEFT HIP;  Surgeon: Mcarthur Rossetti, MD;  Location: [facility] ORS;  Service: Orthopedics;  Laterality: Left;    OB History    Gravida Para Term Preterm AB Living             2   SAB TAB Ectopic Multiple Live Births                   Home Medications    Prior to Admission medications   Medication Sig Start Date End Date Taking? Authorizing Provider  acetaminophen (TYLENOL) 650 MG CR tablet Take 650 mg by mouth every 8 (eight) hours as needed for pain.    [provider]  apixaban (ELIQUIS) 2.5 MG TABS tablet Take 1 tablet (2.5 mg total) by mouth 2 (two) times daily. 05/06/16   Rehman, Mechele Dawley, MD  clotrimazole (LOTRIMIN) 1 % cream Apply 1 application topically as needed.    [provider]  diltiazem (CARDIZEM CD) 120 MG 24 hr capsule take 1 capsule by mouth once daily 10/16/16   Herminio Commons, MD  levothyroxine (SYNTHROID, LEVOTHROID) 100 MCG tablet Take 100 mcg by mouth daily before breakfast.    [provider]  metoprolol tartrate (LOPRESSOR) 25 MG tablet take 1 tablet by mouth twice a day 12/02/15   Arnoldo Lenis, MD  metoprolol tartrate (LOPRESSOR) 25 MG tablet take 1 tablet twice a day 10/02/16   Herminio Commons, MD  mupirocin ointment (BACTROBAN) 2 % Place 1 application into the nose as needed.    [provider]    Family History Family History  Problem Relation Age of Onset  . Heart attack Mother   . Heart attack Father   . Cancer Brother     Social History Social History  Substance Use Topics  . Smoking status: Never Smoker  . Smokeless tobacco: Never Used     Comment: Smoked a few times in college (socially)  . Alcohol use No     Comment: None since Christmas 2013     Allergies   Patient has no known allergies.   Review of Systems Review of Systems  Constitutional: Positive for appetite change.  HENT: Negative.    Eyes: Negative.   Cardiovascular: Negative.   Gastrointestinal: Positive for nausea.  Endocrine: Negative.   Genitourinary: Negative.   Musculoskeletal: Negative.   Skin: Positive for color change.  Allergic/Immunologic: Negative.   Neurological: Negative.   Hematological: Negative.   Psychiatric/Behavioral: Negative.   All other systems reviewed and are negative.    Physical Exam Updated Vital Signs BP (!) 136/104 (BP Location: Left Arm)   Pulse 98   Temp 99.6 F (37.6 C) (Oral)   Resp (!) 29   Ht 1.626 m (5\' 4" )   Wt 56.7 kg (125 lb)   SpO2 98%   BMI 21.46 kg/m   Physical Exam  Constitutional: She is oriented to person, place, and time. She appears well-developed and well-nourished. No distress.  HENT:  Head: Normocephalic and atraumatic.  Right Ear: External ear normal.  Left Ear: External ear normal.  Eyes: Pupils are equal, round, and reactive to light. Scleral icterus is present.  Neck: Normal range of motion. Neck supple.  Cardiovascular: An irregularly irregular rhythm present.  Pulmonary/Chest: Effort normal and breath sounds normal. She has no rales.  Abdominal: Soft. Bowel sounds are normal.  Musculoskeletal: Normal range of motion.  Neurological: She is alert and oriented to person, place, and time. She displays normal reflexes. No cranial nerve deficit. She exhibits normal muscle tone. Coordination normal.  Skin: Skin is warm and dry. Capillary refill takes less than 2 seconds.  jaundiced  Psychiatric: She has a normal mood and affect.  Nursing note and vitals reviewed.    ED Treatments / Results  Labs (all labs ordered are listed, but only abnormal results are displayed) Labs Reviewed  CBC WITH DIFFERENTIAL/PLATELET - Abnormal; Notable for the following:       Result Value   WBC 16.4 (*)    Neutro Abs 14.2 (*)    Monocytes Absolute 1.2 (*)    All other components within normal limits  COMPREHENSIVE METABOLIC PANEL - Abnormal; Notable for the  following:    Sodium 130 (*)    Chloride 96 (*)    Glucose, Bld 129 (*)    Creatinine, Ser 1.35 (*)    Calcium 8.7 (*)    Albumin 2.7 (*)    AST 169 (*)    ALT 147 (*)    Alkaline Phosphatase 843 (*)    Total Bilirubin 20.5 (*)    GFR calc non Af Amer 33 (*)    GFR calc Af Amer 39 (*)    All other components within normal limits  URINALYSIS, ROUTINE W REFLEX MICROSCOPIC - Abnormal; Notable for the following:    Color, Urine AMBER (*)    APPearance HAZY (*)    Hgb urine dipstick MODERATE (*)    Bilirubin Urine MODERATE (*)    Protein, ur 100 (*)    Bacteria, UA RARE (*)    Squamous Epithelial / LPF 6-30 (*)    All other components within normal limits  LIPASE, BLOOD    EKG  EKG Interpretation  Date/Time:  Friday November 10 2016 17:00:30 EDT Ventricular Rate:  88 PR Interval:    QRS Duration: 96 QT Interval:  366 QTC Calculation: 443 R Axis:   108 Text Interpretation:  Atrial fibrillation Ventricular premature complex Abnormal lateral Q waves Anterior infarct, old Borderline T abnormalities, inferior leads Confirmed by Matias Thurman MD, Sherrice Creekmore [phone]) on 11/10/2016 5:49:42 PM       Radiology Dg Chest Port 1 View  Result Date: 11/10/2016 CLINICAL DATA:  Weakness.  Jaundice EXAM: PORTABLE CHEST 1 VIEW COMPARISON:  09/25/2013 FINDINGS: Chronic cardiomegaly and large hiatal hernia. Accounting for artifact there is no convincing consolidation. No edema, effusion, or pneumothorax. Remote and healed proximal left humerus fracture. IMPRESSION: 1. No acute finding. 2. Chronic cardiomegaly and large hiatal hernia. Electronically Signed   By: Monte Fantasia M.D.   On: 11/10/2016 16:09    Procedures Procedures (including critical care time)  Medications Ordered in ED Medications - No data to display   Initial Impression / Assessment and Plan / ED Course  I have reviewed the triage vital signs and the nursing notes.  Pertinent labs & imaging results that were available during my care of  the patient were reviewed by me and considered in my medical decision making (see  chart for details).     Patient presents with painless jaundice.  However, also with fever and elevated wbc.  Unasyn ordered.   Discussed with resident, on call for family practice and will see to admit Discussed with Dr. Silverio Decamp and she will see for GI. Patient febrile with leukocytosis and covered for cholecystitis with zosyn.  Patient hemodynamically stable. 5:48 PM IV fluids and NS infusing.  Discussed results with patient, son, daughter and daughter in law .  Updated on plan and questions answered.   Final Clinical Impressions(s) / ED Diagnoses   Final diagnoses:  RUQ pain    New Prescriptions New Prescriptions   No medications on file     Pattricia Boss, MD 11/11/16 1535

## 2016-11-10 NOTE — Consult Note (Signed)
Consultation  Referring Provider:     ER Primary Care Physician:  Sinda Du, MD Primary Gastroenterologist:     Dr Laural Golden    Reason for Consultation:   Jaundice              HPI:   Regina [name] is a 83 y.o. female history of A. fib on  Eliquis , hiatal hernia, cholelithiasis and choledocholithiasis status post ERCP in October 2017 with large CBD stone 16 mm, unable to extract placed 10 [place] by 7 cm CBD stent and subsequent ERCP December 2017 with removal of stent, extension of sphincterotomy and successful extraction of stones . She was doing well until this episode and did not undergo cholecystectomy. She was told that she has gallstones. The patient presented with jaundice, yellow discoloration first noticed patent week ago progressively worsening, fatigue, and anorexia . She was started on amoxicillin by her PMD for UTI earlier this week and also had labs checked . She was noted to have markedly LFT elevation and patient was advised to come to [facility] as Dr. Laural Golden was not available. Patient denies any abdominal pain ,fever or chills . Review of system positive for nausea but no vomiting, melena or blood per rectum.  In the ER patient had low-grade fever 100.6 and tachycardic at 103, blood pressure 113/52 Total bili 20.5, alkaline phosphatase 843, AST 169, ALT 147 and white count elevated at 16.4   LFTs were normal in February 2018.   Past Medical History:  Diagnosis Date  . Anemia   . Arthritis   . Avascular necrosis of bone of left hip ([place])   . Dysrhythmia 4/14   atrial fib post op  . H/O hiatal hernia   . History of blood transfusion   . Hypertension   . Hypothyroidism   . Stroke [facility]) 2003   No deficits    Past Surgical History:  Procedure Laterality Date  . BALLOON DILATION N/A 05/05/2016   Procedure: BALLOON DILATION OF SPHINCTEROTOMY;  Surgeon: Rogene Houston, MD;  [facility];  Service: Endoscopy;  Laterality: N/A;  . ERCP N/A  03/31/2016   Procedure: ENDOSCOPIC RETROGRADE CHOLANGIOPANCREATOGRAPHY (ERCP);  Surgeon: Rogene Houston, MD;  [facility];  Service: Endoscopy;  Laterality: N/A;  . ERCP N/A 05/05/2016   Procedure: ENDOSCOPIC RETROGRADE CHOLANGIOPANCREATOGRAPHY (ERCP);  Surgeon: Rogene Houston, MD;  [facility];  Service: Endoscopy;  Laterality: N/A;  do NOT move per [facility]  . EYE SURGERY Right   . GASTROINTESTINAL STENT REMOVAL N/A 05/05/2016   Procedure: Biliary stent removal.;  Surgeon: Rogene Houston, MD;  [facility];  Service: Endoscopy;  Laterality: N/A;  . HIP PINNING,CANNULATED Left 09/13/2012   Procedure: ASNIS HIP PINNING LEFT;  Surgeon: Sanjuana Kava, MD;  Location: [facility] ORS;  Service: Orthopedics;  Laterality: Left;  . LITHOTRIPSY N/A 03/31/2016   Procedure: MECHANICAL LITHOTRIPSY;  Surgeon: Rogene Houston, MD;  [facility];  Service: Endoscopy;  Laterality: N/A;  . Mouth Sugery     Upper teeth impacted  . REMOVAL OF STONES N/A 03/31/2016   Procedure: PARTIAL RETRIEVAL OF STONE AND STENTING;  Surgeon: Rogene Houston, MD;  [facility];  Service: Endoscopy;  Laterality: N/A;  . REMOVAL OF STONES N/A 05/05/2016   Procedure: REMOVAL OF STONES;  Surgeon: Rogene Houston, MD;  [facility];  Service: Endoscopy;  Laterality: N/A;  . SPHINCTEROTOMY N/A 03/31/2016  Procedure: SPHINCTEROTOMY;  Surgeon: Rogene Houston, MD;  [facility];  Service: Endoscopy;  Laterality: N/A;  . SPYGLASS CHOLANGIOSCOPY N/A 05/05/2016   Procedure: [id number] CHOLANGIOSCOPY;  Surgeon: Rogene Houston, MD;  [facility];  Service: Endoscopy;  Laterality: N/A;  . TOTAL HIP ARTHROPLASTY Left 10/03/2013   Procedure: LEFT TOTAL HIP ARTHROPLASTY ANTERIOR APPROACH and REMOVAL OF SCREWS LEFT HIP;  Surgeon: Mcarthur Rossetti, MD;  Location: [facility]S;  Service: Orthopedics;  Laterality: Left;    Family History  Problem Relation Age of Onset    . Heart attack Mother   . Heart attack Father   . Cancer Brother      Social History  Substance Use Topics  . Smoking status: Never Smoker  . Smokeless tobacco: Never Used     Comment: Smoked a few times in college (socially)  . Alcohol use No     Comment: None since Christmas 2013    Prior to Admission medications   Medication Sig Start Date End Date Taking? Authorizing Provider  acetaminophen (TYLENOL) 325 MG tablet Take 325 mg by mouth every 6 (six) hours as needed for headache (pain).   Yes [provider]  amoxicillin (AMOXIL) 500 MG capsule Take 500 mg by mouth 3 (three) times daily. 10 day course started 11/08/16 pm 11/08/16  Yes [provider]  apixaban (ELIQUIS) 2.5 MG TABS tablet Take 1 tablet (2.5 mg total) by mouth 2 (two) times daily. 05/06/16  Yes Rehman, Mechele Dawley, MD  clotrimazole-betamethasone (LOTRISONE) cream Apply 1 application topically 2 (two) times daily as needed (rash).  09/28/16  Yes [provider]  diltiazem (CARDIZEM CD) 120 MG 24 hr capsule take 1 capsule by mouth once daily 10/16/16  Yes Herminio Commons, MD  levothyroxine (SYNTHROID, LEVOTHROID) 100 MCG tablet Take 100 mcg by mouth daily.    Yes [provider]  metoprolol tartrate (LOPRESSOR) 25 MG tablet take 1 tablet twice a day 10/02/16  Yes Herminio Commons, MD  pantoprazole (PROTONIX) 40 MG tablet Take 40 mg by mouth daily. 10/23/16  Yes [provider]  metoprolol tartrate (LOPRESSOR) 25 MG tablet take 1 tablet by mouth twice a day Patient not taking: Reported on 11/10/2016 12/02/15   Arnoldo Lenis, MD    No current facility-administered medications for this encounter.    Current Outpatient Prescriptions  Medication Sig Dispense Refill  . acetaminophen (TYLENOL) 325 MG tablet Take 325 mg by mouth every 6 (six) hours as needed for headache (pain).    Marland Kitchen amoxicillin (AMOXIL) 500 MG capsule Take 500 mg by mouth 3 (three) times daily. 10 day course  started 11/08/16 pm  0  . apixaban (ELIQUIS) 2.5 MG TABS tablet Take 1 tablet (2.5 mg total) by mouth 2 (two) times daily. 60 tablet 6  . clotrimazole-betamethasone (LOTRISONE) cream Apply 1 application topically 2 (two) times daily as needed (rash).   1  . diltiazem (CARDIZEM CD) 120 MG 24 hr capsule take 1 capsule by mouth once daily 90 capsule 2  . levothyroxine (SYNTHROID, LEVOTHROID) 100 MCG tablet Take 100 mcg by mouth daily.     . metoprolol tartrate (LOPRESSOR) 25 MG tablet take 1 tablet twice a day 60 tablet 0  . pantoprazole (PROTONIX) 40 MG tablet Take 40 mg by mouth daily.  1  . metoprolol tartrate (LOPRESSOR) 25 MG tablet take 1 tablet by mouth twice a day (Patient not taking: Reported on 11/10/2016) 180 tablet 3    Allergies  as of 11/10/2016  . (No Known Allergies)     Review of Systems:    This is positive for those things mentioned in the HPI, All other review of systems are negative.       Physical Exam:  Vital signs in last 24 hours: Temp:  [99 F (37.2 C)-100.6 F (38.1 C)] 99 F (37.2 C) (06/08 1920) Pulse Rate:  [45-103] 80 (06/08 1845) Resp:  [17-40] 22 (06/08 1845) BP: (113-137)/(52-104) 125/58 (06/08 1830) SpO2:  [95 %-99 %] 97 % (06/08 1845) Weight:  [125 lb (56.7 kg)] 125 lb (56.7 kg) (06/08 1435)    General:   no acute distress Eyes:  icteric. ENT:   Mouth and posterior pharynx free of lesions.  Neck:   supple w/o thyromegaly or mass.  Lungs: Clear to auscultation bilaterally. Heart:  S1S2, no rubs, murmurs, gallops. Abdomen:  soft, non-tender, no hepatosplenomegaly, hernia, or mass and BS+.  Extremities:   no edema Skin   no rash.Jaundice + Neuro:  A&O x 3.  Psych:  appropriate mood and  Affect.   Data Reviewed:   LAB RESULTS:  Recent Labs  11/10/16 1529  WBC 16.4*  HGB 13.3  HCT 37.9  PLT 291   BMET  Recent Labs  11/10/16 1529  NA 130*  K 4.8  CL 96*  CO2 24  GLUCOSE 129*  BUN 16  CREATININE 1.35*  CALCIUM 8.7*    LFT  Recent Labs  11/10/16 1529  PROT 7.5  ALBUMIN 2.7*  AST 169*  ALT 147*  ALKPHOS 843*  BILITOT 20.5*   PT/INR No results for input(s): LABPROT, INR in the last 72 hours.  STUDIES: Dg Chest Port 1 View  Result Date: 11/10/2016 CLINICAL DATA:  Weakness.  Jaundice EXAM: PORTABLE CHEST 1 VIEW COMPARISON:  09/25/2013 FINDINGS: Chronic cardiomegaly and large hiatal hernia. Accounting for artifact there is no convincing consolidation. No edema, effusion, or pneumothorax. Remote and healed proximal left humerus fracture. IMPRESSION: 1. No acute finding. 2. Chronic cardiomegaly and large hiatal hernia. Electronically Signed   By: Monte Fantasia M.D.   On: 11/10/2016 16:09     PREVIOUS ENDOSCOPIES:            As described in history of present illness, ERCP x2 October and December 2017 by Dr.Rehman    Impression / Plan:   83 year old female with history of A. fib on Eliquis, stroke, cholelithiasis and choledocholithiasis presented with evidence of cholestasis, total bili elevated to greater than 20, jaundice, elevated white count and low-grade fever concerning for cholangitis and possible CBD obstruction  Abdominal ultrasound hasn't been performed yet  We'll request MRCP IV fluids IV Zosyn Hold Eliquis and anticoagulation Tentatively plan for ERCP tomorrow a.m. [facility] backup for ERCP, we'll need to coordinate the timing of the procedure ) Nothing by mouth after midnight  K. Denzil Magnuson , MD [phone] Mon-Fri 8a-5p [phone] after 5p, weekends, holidays  @  11/10/2016, 7:37 PM

## 2016-11-11 ENCOUNTER — Inpatient Hospital Stay (HOSPITAL_COMMUNITY): Payer: Medicare Other

## 2016-11-11 ENCOUNTER — Encounter (HOSPITAL_COMMUNITY): Payer: Self-pay | Admitting: Gastroenterology

## 2016-11-11 ENCOUNTER — Encounter (HOSPITAL_COMMUNITY)
Admission: EM | Disposition: A | Discharge status: Home / Self Care | Payer: Self-pay | Source: Home / Self Care | Attending: Family Medicine

## 2016-11-11 ENCOUNTER — Inpatient Hospital Stay (HOSPITAL_COMMUNITY): Payer: Medicare Other | Admitting: Anesthesiology

## 2016-11-11 DIAGNOSIS — K8051 Calculus of bile duct without cholangitis or cholecystitis with obstruction: Secondary | ICD-10-CM

## 2016-11-11 DIAGNOSIS — K8309 Other cholangitis: Secondary | ICD-10-CM

## 2016-11-11 HISTORY — PX: ERCP: SHX5425

## 2016-11-11 LAB — CBC
HEMATOCRIT: 33.1 % — AB (ref 36.0–46.0)
Hemoglobin: 11.3 g/dL — ABNORMAL LOW (ref 12.0–15.0)
MCH: 29.1 pg (ref 26.0–34.0)
MCHC: 34.1 g/dL (ref 30.0–36.0)
MCV: 85.3 fL (ref 78.0–100.0)
Platelets: 271 10*3/uL (ref 150–400)
RBC: 3.88 MIL/uL (ref 3.87–5.11)
RDW: 14.8 % (ref 11.5–15.5)
WBC: 11.7 10*3/uL — ABNORMAL HIGH (ref 4.0–10.5)

## 2016-11-11 LAB — COMPREHENSIVE METABOLIC PANEL
ALBUMIN: 2.2 g/dL — AB (ref 3.5–5.0)
ALT: 117 U/L — ABNORMAL HIGH (ref 14–54)
ANION GAP: 11 (ref 5–15)
AST: 136 U/L — ABNORMAL HIGH (ref 15–41)
Alkaline Phosphatase: 722 U/L — ABNORMAL HIGH (ref 38–126)
BUN: 12 mg/dL (ref 6–20)
CO2: 23 mmol/L (ref 22–32)
Calcium: 8.3 mg/dL — ABNORMAL LOW (ref 8.9–10.3)
Chloride: 99 mmol/L — ABNORMAL LOW (ref 101–111)
Creatinine, Ser: 1.27 mg/dL — ABNORMAL HIGH (ref 0.44–1.00)
GFR, EST AFRICAN AMERICAN: 41 mL/min — AB (ref >60)
GFR, EST NON AFRICAN AMERICAN: 36 mL/min — AB (ref >60)
Glucose, Bld: 101 mg/dL — ABNORMAL HIGH (ref 65–99)
Potassium: 4.2 mmol/L (ref 3.5–5.1)
Sodium: 133 mmol/L — ABNORMAL LOW (ref 135–145)
TOTAL PROTEIN: 6.3 g/dL — AB (ref 6.5–8.1)
Total Bilirubin: 18.8 mg/dL (ref 0.3–1.2)

## 2016-11-11 LAB — APTT: APTT: 40 s — AB (ref 24–36)

## 2016-11-11 LAB — HEPARIN LEVEL (UNFRACTIONATED): Heparin Unfractionated: 1.54 IU/mL — ABNORMAL HIGH (ref 0.30–0.70)

## 2016-11-11 SURGERY — ERCP, WITH INTERVENTION IF INDICATED
Anesthesia: General

## 2016-11-11 MED ORDER — LIDOCAINE 2% (20 MG/ML) 5 ML SYRINGE
INTRAMUSCULAR | Status: DC | PRN
  Administered 2016-11-11: 60 mg via INTRAVENOUS

## 2016-11-11 MED ORDER — FENTANYL CITRATE (PF) 250 MCG/5ML IJ SOLN
INTRAMUSCULAR | Status: AC
  Filled 2016-11-11: qty 5

## 2016-11-11 MED ORDER — GLUCAGON HCL RDNA (DIAGNOSTIC) 1 MG IJ SOLR
INTRAMUSCULAR | Status: AC
Start: 2016-11-11 — End: 2016-11-11
  Filled 2016-11-11: qty 1

## 2016-11-11 MED ORDER — SUCCINYLCHOLINE CHLORIDE 20 MG/ML IJ SOLN
INTRAMUSCULAR | Status: DC | PRN
  Administered 2016-11-11: 80 mg via INTRAVENOUS

## 2016-11-11 MED ORDER — APIXABAN 2.5 MG PO TABS: ORAL_TABLET | ORAL | 6 refills | Status: DC

## 2016-11-11 MED ORDER — IOPAMIDOL (ISOVUE-300) INJECTION 61%
INTRAVENOUS | Status: AC
Start: 2016-11-11 — End: 2016-11-11
  Filled 2016-11-11: qty 50

## 2016-11-11 MED ORDER — PHENYLEPHRINE HCL 10 MG/ML IJ SOLN
INTRAMUSCULAR | Status: DC | PRN
  Administered 2016-11-11: 45 ug/min via INTRAVENOUS

## 2016-11-11 MED ORDER — INDOMETHACIN 50 MG RE SUPP
RECTAL | Status: AC
Start: 2016-11-11 — End: 2016-11-11
  Filled 2016-11-11: qty 2

## 2016-11-11 MED ORDER — SODIUM CHLORIDE 0.9 % IV SOLN: INTRAVENOUS | Status: DC

## 2016-11-11 MED ORDER — ONDANSETRON HCL 4 MG/2ML IJ SOLN
INTRAMUSCULAR | Status: DC | PRN
Start: 2016-11-11 — End: 2016-11-11
  Administered 2016-11-11: 4 mg via INTRAVENOUS

## 2016-11-11 MED ORDER — HEPARIN (PORCINE) IN NACL 100-0.45 UNIT/ML-% IJ SOLN
850.0000 [IU]/h | INTRAMUSCULAR | Status: DC
  Filled 2016-11-11: qty 250

## 2016-11-11 MED ORDER — PROPOFOL 10 MG/ML IV BOLUS
INTRAVENOUS | Status: DC | PRN
  Administered 2016-11-11: 50 mg via INTRAVENOUS

## 2016-11-11 MED ORDER — FENTANYL CITRATE (PF) 250 MCG/5ML IJ SOLN
INTRAMUSCULAR | Status: DC | PRN
  Administered 2016-11-11 (×2): 25 ug via INTRAVENOUS
  Administered 2016-11-11 (×2): 50 ug via INTRAVENOUS

## 2016-11-11 MED ORDER — PHENYLEPHRINE 40 MCG/ML (10ML) SYRINGE FOR IV PUSH (FOR BLOOD PRESSURE SUPPORT)
PREFILLED_SYRINGE | INTRAVENOUS | Status: DC | PRN
  Administered 2016-11-11 (×5): 80 ug via INTRAVENOUS

## 2016-11-11 NOTE — Anesthesia Preprocedure Evaluation (Addendum)
Anesthesia Evaluation  Patient identified by MRN, date of birth, ID band Patient awake    Reviewed: Allergy & Precautions, H&P , NPO status , Patient's Chart, lab work & pertinent test results  Airway Mallampati: II  TM Distance: >3 FB Neck ROM: Full    Dental no notable dental hx. (+) Teeth Intact, Dental Advisory Given   Pulmonary neg pulmonary ROS,    Pulmonary exam normal breath sounds clear to auscultation       Cardiovascular hypertension, Pt. on medications and Pt. on home beta blockers + dysrhythmias Atrial Fibrillation  Rhythm:Irregular Rate:Tachycardia     Neuro/Psych CVA, No Residual Symptoms negative psych ROS   GI/Hepatic Neg liver ROS, hiatal hernia,   Endo/Other  Hypothyroidism   Renal/GU negative Renal ROS  negative genitourinary   Musculoskeletal  (+) Arthritis , Osteoarthritis,    Abdominal   Peds  Hematology negative hematology ROS (+) anemia ,   Anesthesia Other Findings   Reproductive/Obstetrics negative OB ROS                            Anesthesia Physical Anesthesia Plan  ASA: III  Anesthesia Plan: General   Post-op Pain Management:    Induction: Intravenous  PONV Risk Score and Plan: 3 and Ondansetron, Dexamethasone, Propofol and Treatment may vary due to age or medical condition  Airway Management Planned: Oral ETT  Additional Equipment:   Intra-op Plan:   Post-operative Plan: Extubation in OR  Informed Consent: I have reviewed the patients History and Physical, chart, labs and discussed the procedure including the risks, benefits and alternatives for the proposed anesthesia with the patient or authorized representative who has indicated his/her understanding and acceptance.   Dental advisory given  Plan Discussed with: CRNA  Anesthesia Plan Comments:         Anesthesia Quick Evaluation

## 2016-11-11 NOTE — Progress Notes (Addendum)
ANTICOAGULATION CONSULT NOTE - Follow Up Consult  Pharmacy Consult for Heparin (holding apixaban) Indication: atrial fibrillation  No Known Allergies  Patient Measurements: Height: 5' 4.5" (163.8 cm) Weight: 122 lb 12.8 oz (55.7 kg) IBW/kg (Calculated) : 55.85  Vital Signs: Temp: 99 F (37.2 C) (06/09 0501) Temp Source: Oral (06/09 0501) BP: 132/61 (06/09 0501) Pulse Rate: 83 (06/09 0501)  Labs:  Recent Labs  11/10/16 1529 11/10/16 2037 11/11/16 0452  HGB 13.3  --  11.3*  HCT 37.9  --  33.1*  PLT 291  --  271  APTT  --  38* 40*  LABPROT  --  18.2*  --   INR  --  1.49  --   HEPARINUNFRC  --  >2.20* 1.54*  CREATININE 1.35*  --  1.27*    Estimated Creatinine Clearance: 25.4 mL/min (A) (by C-G formula based on SCr of 1.27 mg/dL (H)).   Assessment: Heparin while holding apixaban in anticipation of GI procedure  Goal of Therapy:  Heparin level 0.3-0.7 units/ml  APTT 66-102 Monitor platelets by anticoagulation protocol: Yes   Plan:  -Heparin currently on hold per GI rec  Narda Bonds 11/11/2016,6:53 AM

## 2016-11-11 NOTE — Op Note (Signed)
Initial duodenal intubation was difficult, as patient had a large hiatal hernia. The position of the patient was changed from prone to left lateral, and eventually the scope was advanced into the duodenum.  The ampulla was noted in the second portion, with post-sphincterotomy changes. The CBD was cannulated, contrast injection showed CBD dilatation of over 2 cm, with a possible large stone in the distal common bile duct, almost 2 cm in size.  The sphincterotomy was further extended, balloon sweeps were performed with 18 mm balloon, which retrieved pus.  The stone could not be retrieved, despite several maneuvers, including extension of sphincterotomy, use of 2 cm retrieval basket, and balloon sweeps.  Initial attempt was made to leave a 10 FrenchX 7 cm stent into the CBD, however, the wire fell back on 2 separate occasions. After re-cannulating the CBD for the third time, a decision was made to leave a smaller 5 centimeterX 7 [place] plastic stent.  Recommend repeat ERCP with spyglass and possible lithotripsy.  Continue patient on IV Zosyn. Will need cholecystectomy post removal of CBD stone.   Ronnette Juniper, M.D.

## 2016-11-11 NOTE — Progress Notes (Signed)
Pt transfer to ERCP. 

## 2016-11-11 NOTE — Op Note (Signed)
[facility] Patient Name: Regina [name] Procedure Date : 11/11/2016 MRN: [id number] Attending MD: Ronnette Juniper , MD Date of Birth: 1922-11-26 CSN: [id number] Age: 83 Admit Type: Inpatient Procedure:                ERCP Indications:              For therapy of bile duct stone(s), For therapy of                            ascending cholangitis Providers:                Ronnette Juniper, MD, Cleda Daub, RN, Alan Mulder,                            Technician, Cherylynn Ridges, Technician, Otis Brace, MD Referring MD:              Medicines:                Monitored Anesthesia Care Complications:            No immediate complications. Estimated Blood Loss:     Estimated blood loss: none. Procedure:                Pre-Anesthesia Assessment:                           - Prior to the procedure, a History and Physical                            was performed, and patient medications and                            allergies were reviewed. The patient's tolerance of                            previous anesthesia was also reviewed. The risks                            and benefits of the procedure and the sedation                            options and risks were discussed with the patient.                            All questions were answered, and informed consent                            was obtained. Prior Anticoagulants: The patient has                            taken Eliquis (apixaban), last dose was 1 day prior  to procedure. ASA Grade Assessment: III - A patient                            with severe systemic disease. After reviewing the                            risks and benefits, the patient was deemed in                            satisfactory condition to undergo the procedure.                           After obtaining informed consent, the scope was                            passed under direct vision.  Throughout the                            procedure, the patient's blood pressure, pulse, and                            oxygen saturations were monitored continuously. The                            Duodenoscope was introduced through the mouth, and                            used to inject contrast into and used to inject                            contrast into the bile duct. The ERCP was                            technically difficult and complex due to abnormal                            anatomy and significant looping. Successful                            completion of the procedure was aided by changing                            the patient's position to left lateral. The patient                            tolerated the procedure well. Scope In: Scope Out: Findings:      The scout film was normal. The esophagus was successfully intubated       under direct vision. The scope was advanced to a normal major papilla in       the descending duodenum without detailed examination of the pharynx,       larynx and associated structures, and upper GI tract. The upper GI tract       was grossly normal.  Patient seemed to have a large hiatal hernia, which made duodenal       intubation challenging.The bile duct was deeply cannulated with the       sphincterotome. Contrast was injected. I personally interpreted the bile       duct images. There was brisk flow of contrast through the ducts. Image       quality was adequate. Contrast extended to the main bile duct. The lower       third of the main bile duct contained filling defect(s) thought to be a       stone. The main bile duct was diffusely dilated, uncertain etiology. The       largest diameter was 20 mm. A straight Roadrunner wire was passed into       the biliary tree. A 15 mm biliary sphincterotomy was made with a braided       sphincterotome using ERBE electrocautery. The sphincterotomy oozed       blood. The biliary tree was  swept with an 18 mm balloon starting at the       upper third of the main bile duct. Pus was swept from the duct.      An attempt was made to introduce a retrieval basket of 2 cm, however,       the wire fell off on several occasions, the basket could not be advanced       beyond the distal CBD.      After several balloon sweeps 18 mm balloon(also 15 mm and12 mm       balloons),stone could not be extracted.      Initially,an attempt was made to place a 10 [place] -7 cm CBD stent,       however, the wire fell off on 2 separate occasions. The Hydratome was       used to recannulate the CBD the third time.      Finally, One 7 Fr by 5 cm plastic stent with a single external flap and       a single internal flap was placed 4.5 cm into the common bile duct. Pus       flowed through the stent. The stent was in good position. Impression:               - A filling defect consistent with a stone was seen                            on the cholangiogram.                           - The entire main bile duct was dilated, uncertain                            etiology.                           - The examination was suspicious for                            choledocholithiasis. Removal by biliary                            sphincterotomy was not accomplished; a stent was  inserted.                           - A biliary sphincterotomy was performed.                           - The biliary tree was swept and pus was found.                           - One plastic stent was placed into the common bile                            duct. Recommendation:           - Repeat ERCP at appointment to be scheduled with                            spyglass and possible lithotripsy.                           - Clear liquid diet.                           - Check liver enzymes (AST, ALT, alkaline                            phosphatase, bilirubin) tomorrow.                           - Continue  antibiotics.                           - Cholecystectomy post CBD stone removal. Procedure Code(s):        --- Professional ---                           [phone], Endoscopic retrograde                            cholangiopancreatography (ERCP); with placement of                            endoscopic stent into biliary or pancreatic duct,                            including pre- and post-dilation and guide wire                            passage, when performed, including sphincterotomy,                            when performed, each stent                           [id number], Endoscopic catheterization of the biliary  ductal system, radiological supervision and                            interpretation Diagnosis Code(s):        --- Professional ---                           K80.30, Calculus of bile duct with cholangitis,                            unspecified, without obstruction                           K83.0, Cholangitis                           K83.8, Other specified diseases of biliary tract                           R93.2, Abnormal findings on diagnostic imaging of                            liver and biliary tract CPT copyright 2016 [employer]. All rights reserved. The codes documented in this report are preliminary and upon coder review may  be revised to meet current compliance requirements. Ronnette Juniper, MD 11/11/2016 1:14:54 PM This report has been signed electronically. Otis Brace, MD Number of Addenda: 0

## 2016-11-11 NOTE — Progress Notes (Signed)
Pt NPO midnight, for ERCP today with consent, also family agreed for low bed and bed alarm on  with or without family at the bedside and its okay to have bedside commode but not agreed for floor mat, explained the pros and cons of the floor mat but they decline, ADON Joyce aware.

## 2016-11-11 NOTE — Brief Op Note (Signed)
11/10/2016 - 11/11/2016  12:57 PM  PATIENT:  Regina [name]  83 y.o. female  PRE-OPERATIVE DIAGNOSIS:  CHOLANGITIS  POST-OPERATIVE DIAGNOSIS:  Sphincterotomy, Balloon extraction-3 balloons used, attempted basket use-unable to thread, 7 french 5cm Biliary stent placed, 88fench 7cm attempted-unable to pass   PROCEDURE:  Procedure(s) with comments: ENDOSCOPIC RETROGRADE CHOLANGIOPANCREATOGRAPHY (ERCP) (N/A) - PRONE POSITION  SURGEON:  Surgeon(s) and Role:    * KRonnette Juniper MD - Primary    * Brahmbhatt, Parag, MD - Assisting  PHYSICIAN ASSISTANT:   ASSISTANTS: none   ANESTHESIA:   MAC  EBL:  Total I/O In: 3 [I.V.:3] Out: 2 [Blood:2]  BLOOD ADMINISTERED:none  DRAINS: none   LOCAL MEDICATIONS USED:  NONE  SPECIMEN:  No Specimen  DISPOSITION OF SPECIMEN:  N/A  COUNTS:  YES  TOURNIQUET:  * No tourniquets in log *  DICTATION: .Dragon Dictation  PLAN OF CARE: Admit to inpatient   PATIENT DISPOSITION:  PACU - hemodynamically stable.   Delay start of Pharmacological VTE agent (>24hrs) due to surgical blood loss or risk of bleeding: yes

## 2016-11-11 NOTE — Anesthesia Postprocedure Evaluation (Signed)
Anesthesia Post Note  Patient: Regina [name]  Procedure(s) Performed: Procedure(s) (LRB): ENDOSCOPIC RETROGRADE CHOLANGIOPANCREATOGRAPHY (ERCP) (N/A)     Patient location during evaluation: PACU Anesthesia Type: General Level of consciousness: awake and alert Pain management: pain level controlled Vital Signs Assessment: post-procedure vital signs reviewed and stable Respiratory status: spontaneous breathing, nonlabored ventilation and respiratory function stable Cardiovascular status: blood pressure returned to baseline and stable Postop Assessment: no signs of nausea or vomiting Anesthetic complications: no    Last Vitals:  Vitals:   11/11/16 1345 11/11/16 1400  BP: (!) 94/55 107/61  Pulse: 70 82  Resp: (!) 21 (!) 23  Temp:  36.4 C    Last Pain:  Vitals:   11/11/16 1315  TempSrc:   PainSc: Asleep                 Annelise Mccoy,W. EDMOND

## 2016-11-11 NOTE — H&P (Signed)
Regina [name] is an 83 y.o. female.   Chief Complaint: Cholangitis HPI: 83 year old female presented with jaundice progressively worsening for one week associated with fatigue and decreased appetite. She was treated with amoxicillin for urinary tract infection last week. On her labs, and elevation of LFTs she was advised to proceed to ER. She has had ERCP in October 2017 and December 2017, for CBD stones removal.  Past Medical History:  Diagnosis Date  . Anemia   . Arthritis   . Avascular necrosis of bone of left hip ([place])   . Dysrhythmia 4/14   atrial fib post op  . H/O hiatal hernia   . History of blood transfusion   . Hypertension   . Hypothyroidism   . Stroke [facility]) 2003   No deficits    Past Surgical History:  Procedure Laterality Date  . BALLOON DILATION N/A 05/05/2016   Procedure: BALLOON DILATION OF SPHINCTEROTOMY;  Surgeon: Rogene Houston, MD;  [facility];  Service: Endoscopy;  Laterality: N/A;  . ERCP N/A 03/31/2016   Procedure: ENDOSCOPIC RETROGRADE CHOLANGIOPANCREATOGRAPHY (ERCP);  Surgeon: Rogene Houston, MD;  [facility];  Service: Endoscopy;  Laterality: N/A;  . ERCP N/A 05/05/2016   Procedure: ENDOSCOPIC RETROGRADE CHOLANGIOPANCREATOGRAPHY (ERCP);  Surgeon: Rogene Houston, MD;  [facility];  Service: Endoscopy;  Laterality: N/A;  do NOT move per [facility]  . EYE SURGERY Right   . GASTROINTESTINAL STENT REMOVAL N/A 05/05/2016   Procedure: Biliary stent removal.;  Surgeon: Rogene Houston, MD;  [facility];  Service: Endoscopy;  Laterality: N/A;  . HIP PINNING,CANNULATED Left 09/13/2012   Procedure: ASNIS HIP PINNING LEFT;  Surgeon: Sanjuana Kava, MD;  Location: [facility];  Service: Orthopedics;  Laterality: Left;  . LITHOTRIPSY N/A 03/31/2016   Procedure: MECHANICAL LITHOTRIPSY;  Surgeon: Rogene Houston, MD;  [facility];  Service: Endoscopy;  Laterality: N/A;  . Mouth Sugery     Upper teeth impacted  .  REMOVAL OF STONES N/A 03/31/2016   Procedure: PARTIAL RETRIEVAL OF STONE AND STENTING;  Surgeon: Rogene Houston, MD;  [facility];  Service: Endoscopy;  Laterality: N/A;  . REMOVAL OF STONES N/A 05/05/2016   Procedure: REMOVAL OF STONES;  Surgeon: Rogene Houston, MD;  [facility];  Service: Endoscopy;  Laterality: N/A;  . SPHINCTEROTOMY N/A 03/31/2016   Procedure: SPHINCTEROTOMY;  Surgeon: Rogene Houston, MD;  [facility];  Service: Endoscopy;  Laterality: N/A;  . SPYGLASS CHOLANGIOSCOPY N/A 05/05/2016   Procedure: ONGEXBMW CHOLANGIOSCOPY;  Surgeon: Rogene Houston, MD;  [facility];  Service: Endoscopy;  Laterality: N/A;  . TOTAL HIP ARTHROPLASTY Left 10/03/2013   Procedure: LEFT TOTAL HIP ARTHROPLASTY ANTERIOR APPROACH and REMOVAL OF SCREWS LEFT HIP;  Surgeon: Mcarthur Rossetti, MD;  Location: [facility];  Service: Orthopedics;  Laterality: Left;    Family History  Problem Relation Age of Onset  . Heart attack Mother   . Heart attack Father   . Cancer Brother    Social History:  reports that she has never smoked. She has never used smokeless tobacco. She reports that she does not drink alcohol or use drugs.  Allergies: No Known Allergies  Medications Prior to Admission  Medication Sig Dispense Refill  . acetaminophen (TYLENOL) 325 MG tablet Take 325 mg by mouth every 6 (six) hours as needed for headache (pain).    Marland Kitchen amoxicillin (AMOXIL) 500 MG capsule Take 500  mg by mouth 3 (three) times daily. 10 day course started 11/08/16 pm  0  . apixaban (ELIQUIS) 2.5 MG TABS tablet Take 1 tablet (2.5 mg total) by mouth 2 (two) times daily. 60 tablet 6  . clotrimazole-betamethasone (LOTRISONE) cream Apply 1 application topically 2 (two) times daily as needed (rash).   1  . diltiazem (CARDIZEM CD) 120 MG 24 hr capsule take 1 capsule by mouth once daily 90 capsule 2  . levothyroxine (SYNTHROID, LEVOTHROID) 100 MCG tablet Take 100 mcg by mouth daily.      . metoprolol tartrate (LOPRESSOR) 25 MG tablet take 1 tablet twice a day 60 tablet 0  . pantoprazole (PROTONIX) 40 MG tablet Take 40 mg by mouth daily.  1  . metoprolol tartrate (LOPRESSOR) 25 MG tablet take 1 tablet by mouth twice a day (Patient not taking: Reported on 11/10/2016) 180 tablet 3    Results for orders placed or performed during the hospital encounter of 11/10/16 (from the past 48 hour(s))  CBC with Differential/Platelet     Status: Abnormal   Collection Time: 11/10/16  3:29 PM  Result Value Ref Range   WBC 16.4 (H) 4.0 - 10.5 K/uL   RBC 4.41 3.87 - 5.11 MIL/uL   Hemoglobin 13.3 12.0 - 15.0 g/dL   HCT 37.9 36.0 - 46.0 %   MCV 85.9 78.0 - 100.0 fL   MCH 30.2 26.0 - 34.0 pg   MCHC 35.1 30.0 - 36.0 g/dL   RDW 14.5 11.5 - 15.5 %   Platelets 291 150 - 400 K/uL   Neutrophils Relative % 87 %   Neutro Abs 14.2 (H) 1.7 - 7.7 K/uL   Lymphocytes Relative 5 %   Lymphs Abs 0.9 0.7 - 4.0 K/uL   Monocytes Relative 8 %   Monocytes Absolute 1.2 (H) 0.1 - 1.0 K/uL   Eosinophils Relative 0 %   Eosinophils Absolute 0.0 0.0 - 0.7 K/uL   Basophils Relative 0 %   Basophils Absolute 0.0 0.0 - 0.1 K/uL  Comprehensive metabolic panel     Status: Abnormal   Collection Time: 11/10/16  3:29 PM  Result Value Ref Range   Sodium 130 (L) 135 - 145 mmol/L   Potassium 4.8 3.5 - 5.1 mmol/L   Chloride 96 (L) 101 - 111 mmol/L   CO2 24 22 - 32 mmol/L   Glucose, Bld 129 (H) 65 - 99 mg/dL   BUN 16 6 - 20 mg/dL   Creatinine, Ser 1.35 (H) 0.44 - 1.00 mg/dL   Calcium 8.7 (L) 8.9 - 10.3 mg/dL   Total Protein 7.5 6.5 - 8.1 g/dL   Albumin 2.7 (L) 3.5 - 5.0 g/dL   AST 169 (H) 15 - 41 U/L   ALT 147 (H) 14 - 54 U/L   Alkaline Phosphatase 843 (H) 38 - 126 U/L   Total Bilirubin 20.5 (HH) 0.3 - 1.2 mg/dL    Comment: CRITICAL RESULT CALLED TO, READ BACK BY AND VERIFIED WITH: RCelesta Aver RN [phone] GREEN R    GFR calc non Af Amer 33 (L) >60 mL/min   GFR calc Af Amer 39 (L) >60 mL/min    Comment:  (NOTE) The eGFR has been calculated using the CKD EPI equation. This calculation has not been validated in all clinical situations. eGFR's persistently <60 mL/min signify possible Chronic Kidney Disease.    Anion gap 10 5 - 15  Lipase, blood     Status: None   Collection Time: 11/10/16  3:29 PM  Result Value Ref Range   Lipase 21 11 - 51 U/L  Urinalysis, Routine w reflex microscopic     Status: Abnormal   Collection Time: 11/10/16  3:29 PM  Result Value Ref Range   Color, Urine AMBER (A) YELLOW    Comment: BIOCHEMICALS MAY BE AFFECTED BY COLOR   APPearance HAZY (A) CLEAR   Specific Gravity, Urine 1.027 1.005 - 1.030   pH 5.0 5.0 - 8.0   Glucose, UA NEGATIVE NEGATIVE mg/dL   Hgb urine dipstick MODERATE (A) NEGATIVE   Bilirubin Urine MODERATE (A) NEGATIVE   Ketones, ur NEGATIVE NEGATIVE mg/dL   Protein, ur 100 (A) NEGATIVE mg/dL   Nitrite NEGATIVE NEGATIVE   Leukocytes, UA NEGATIVE NEGATIVE   RBC / HPF 0-5 0 - 5 RBC/hpf   WBC, UA 6-30 0 - 5 WBC/hpf   Bacteria, UA RARE (A) NONE SEEN   Squamous Epithelial / LPF 6-30 (A) NONE SEEN   Mucous PRESENT    Granular Casts, UA PRESENT   Gamma GT     Status: Abnormal   Collection Time: 11/10/16  7:00 PM  Result Value Ref Range   GGT 985 (H) 7 - 50 U/L  APTT     Status: Abnormal   Collection Time: 11/10/16  8:37 PM  Result Value Ref Range   aPTT 38 (H) 24 - 36 seconds    Comment:        IF BASELINE aPTT IS ELEVATED, SUGGEST PATIENT RISK ASSESSMENT BE USED TO DETERMINE APPROPRIATE ANTICOAGULANT THERAPY.   Heparin level (unfractionated)     Status: Abnormal   Collection Time: 11/10/16  8:37 PM  Result Value Ref Range   Heparin Unfractionated >2.20 (H) 0.30 - 0.70 IU/mL    Comment:        IF HEPARIN RESULTS ARE BELOW EXPECTED VALUES, AND PATIENT DOSAGE HAS BEEN CONFIRMED, SUGGEST FOLLOW UP TESTING OF ANTITHROMBIN III LEVELS. RESULTS CONFIRMED BY MANUAL DILUTION   TSH     Status: None   Collection Time: 11/10/16  8:37 PM   Result Value Ref Range   TSH 2.308 0.350 - 4.500 uIU/mL    Comment: Performed by a 3rd Generation assay with a functional sensitivity of <=0.01 uIU/mL.  Protime-INR     Status: Abnormal   Collection Time: 11/10/16  8:37 PM  Result Value Ref Range   Prothrombin Time 18.2 (H) 11.4 - 15.2 seconds   INR 1.49   Ammonia     Status: None   Collection Time: 11/10/16  8:37 PM  Result Value Ref Range   Ammonia 19 9 - 35 umol/L  Bilirubin, fractionated(tot/dir/indir)     Status: Abnormal   Collection Time: 11/10/16  8:37 PM  Result Value Ref Range   Total Bilirubin 19.6 (HH) 0.3 - 1.2 mg/dL    Comment: CRITICAL VALUE NOTED.  VALUE IS CONSISTENT WITH PREVIOUSLY REPORTED AND CALLED VALUE.   Bilirubin, Direct 12.1 (H) 0.1 - 0.5 mg/dL    Comment: RESULTS CONFIRMED BY MANUAL DILUTION   Indirect Bilirubin 7.5 (H) 0.3 - 0.9 mg/dL  Heparin level (unfractionated)     Status: Abnormal   Collection Time: 11/11/16  4:52 AM  Result Value Ref Range   Heparin Unfractionated 1.54 (H) 0.30 - 0.70 IU/mL    Comment:        IF HEPARIN RESULTS ARE BELOW EXPECTED VALUES, AND PATIENT DOSAGE HAS BEEN CONFIRMED, SUGGEST FOLLOW UP TESTING OF ANTITHROMBIN III LEVELS. RESULTS CONFIRMED BY MANUAL DILUTION   APTT  Status: Abnormal   Collection Time: 11/11/16  4:52 AM  Result Value Ref Range   aPTT 40 (H) 24 - 36 seconds    Comment:        IF BASELINE aPTT IS ELEVATED, SUGGEST PATIENT RISK ASSESSMENT BE USED TO DETERMINE APPROPRIATE ANTICOAGULANT THERAPY.   CBC     Status: Abnormal   Collection Time: 11/11/16  4:52 AM  Result Value Ref Range   WBC 11.7 (H) 4.0 - 10.5 K/uL   RBC 3.88 3.87 - 5.11 MIL/uL   Hemoglobin 11.3 (L) 12.0 - 15.0 g/dL   HCT 33.1 (L) 36.0 - 46.0 %   MCV 85.3 78.0 - 100.0 fL   MCH 29.1 26.0 - 34.0 pg   MCHC 34.1 30.0 - 36.0 g/dL   RDW 14.8 11.5 - 15.5 %   Platelets 271 150 - 400 K/uL  Comprehensive metabolic panel     Status: Abnormal   Collection Time: 11/11/16  4:52 AM   Result Value Ref Range   Sodium 133 (L) 135 - 145 mmol/L   Potassium 4.2 3.5 - 5.1 mmol/L   Chloride 99 (L) 101 - 111 mmol/L   CO2 23 22 - 32 mmol/L   Glucose, Bld 101 (H) 65 - 99 mg/dL   BUN 12 6 - 20 mg/dL   Creatinine, Ser 1.27 (H) 0.44 - 1.00 mg/dL   Calcium 8.3 (L) 8.9 - 10.3 mg/dL   Total Protein 6.3 (L) 6.5 - 8.1 g/dL   Albumin 2.2 (L) 3.5 - 5.0 g/dL   AST 136 (H) 15 - 41 U/L   ALT 117 (H) 14 - 54 U/L   Alkaline Phosphatase 722 (H) 38 - 126 U/L   Total Bilirubin 18.8 (HH) 0.3 - 1.2 mg/dL    Comment: CRITICAL VALUE NOTED.  VALUE IS CONSISTENT WITH PREVIOUSLY REPORTED AND CALLED VALUE.   GFR calc non Af Amer 36 (L) >60 mL/min   GFR calc Af Amer 41 (L) >60 mL/min    Comment: (NOTE) The eGFR has been calculated using the CKD EPI equation. This calculation has not been validated in all clinical situations. eGFR's persistently <60 mL/min signify possible Chronic Kidney Disease.    Anion gap 11 5 - 15   Mr Abdomen Mrcp Wo Contrast  Result Date: 11/11/2016 CLINICAL DATA:  Inpatient. Jaundice and nausea. Cholelithiasis, dilated CBD and choledocholithiasis on abdominal sonogram from earlier today. Cholangitis. EXAM: MRI ABDOMEN WITHOUT CONTRAST  (INCLUDING MRCP) TECHNIQUE: Multiplanar multisequence MR imaging of the abdomen was performed. Heavily T2-weighted images of the biliary and pancreatic ducts were obtained, and three-dimensional MRCP images were rendered by post processing. COMPARISON:  11/11/2016 right upper quadrant abdominal sonogram. FINDINGS: Significantly motion degraded scan. Lower chest: Grossly clear lung bases. Hepatobiliary: Normal liver size and configuration. No hepatic steatosis. There are two similar T2 hyperintense T1 hypointense medial segment left liver lobe lesions measuring 1.3 cm (series 6/ image 12) and 1.1 cm (series 6/image 8). No additional liver lesions. Multiple layering gallstones in the distended gallbladder measuring up to 8 mm in diameter. No  gallbladder wall thickening or pericholecystic fluid. Moderate diffuse intrahepatic biliary ductal dilatation. Prominently dilated common bile duct (26 mm diameter). The MRCP sequence is degraded by motion and nondiagnostic. There is a 1.5 x 1.1 cm filling defect at the junction of the common bile duct and ampulla (series 5/image 28) with intermediate T2 signal intensity. No additional filling defects in the biliary tree. Pancreas: Mild diffuse pancreatic duct dilation up to 5 mm diameter. There  are a few small cystic pancreatic lesions in the pancreatic body and tail, largest 1.1 x 0.7 cm in the pancreatic body (series 5/image 15), without appreciable wall thickening or internal nodularity. No evidence of pancreas divisum. Spleen: Normal size. No mass. Adrenals/Urinary Tract: Normal adrenals. No hydronephrosis. Subcentimeter T2 hyperintense anterior interpolar right renal cortical lesion, presumably a simple renal cysts. No additional renal lesions. Stomach/Bowel: Large hiatal hernia. Stomach is collapsed and otherwise grossly normal. Visualized small and large bowel is normal caliber, with no bowel wall thickening. Vascular/Lymphatic: Nonaneurysmal abdominal aorta. No pathologically enlarged lymph nodes in the abdomen. Other: No abdominal ascites or focal fluid collection. Musculoskeletal: No aggressive appearing focal osseous lesions. IMPRESSION: 1. Limited significantly motion degraded noncontrast MRI/MRCP. 2. Cholelithiasis. Distended gallbladder. No gallbladder wall thickening or pericholecystic fluid to suggest acute cholecystitis. 3. Moderate diffuse intrahepatic biliary ductal dilatation. Prominently dilated common bile duct (26 mm diameter). Solitary 1.5 x 1.1 cm intermediate signal intensity filling defect at the junction of the CBD and ampulla, which is indeterminate for a stone or mass lesion. Correlation with ERCP advised. 4. Mild diffuse pancreatic duct dilation. Scattered small cystic pancreatic  lesions in the pancreatic body and tail, largest 1.1 cm, without overt high risk features by noncontrast MRI. Follow-up MRI abdomen without and with IV contrast recommended in 2 years. This recommendation follows ACR consensus guidelines: Management of Incidental Pancreatic Cysts: A White Paper of the ACR Incidental Findings Committee. J Am Coll Radiol 9758;83:254-982. 5. Two T2 hyperintense medial segment left liver lobe lesions, largest 1.3 cm, incompletely characterized, most likely benign cysts or hemangiomas. Outpatient follow-up MRI abdomen without and with IV contrast or follow-up CT abdomen with IV contrast recommended in 3-6 months. 6. Large hiatal hernia. Electronically Signed   By: Ilona Sorrel M.D.   On: 11/11/2016 08:04   Mr 3d Recon At Scanner  Result Date: 11/11/2016 CLINICAL DATA:  Inpatient. Jaundice and nausea. Cholelithiasis, dilated CBD and choledocholithiasis on abdominal sonogram from earlier today. Cholangitis. EXAM: MRI ABDOMEN WITHOUT CONTRAST  (INCLUDING MRCP) TECHNIQUE: Multiplanar multisequence MR imaging of the abdomen was performed. Heavily T2-weighted images of the biliary and pancreatic ducts were obtained, and three-dimensional MRCP images were rendered by post processing. COMPARISON:  11/11/2016 right upper quadrant abdominal sonogram. FINDINGS: Significantly motion degraded scan. Lower chest: Grossly clear lung bases. Hepatobiliary: Normal liver size and configuration. No hepatic steatosis. There are two similar T2 hyperintense T1 hypointense medial segment left liver lobe lesions measuring 1.3 cm (series 6/ image 12) and 1.1 cm (series 6/image 8). No additional liver lesions. Multiple layering gallstones in the distended gallbladder measuring up to 8 mm in diameter. No gallbladder wall thickening or pericholecystic fluid. Moderate diffuse intrahepatic biliary ductal dilatation. Prominently dilated common bile duct (26 mm diameter). The MRCP sequence is degraded by motion and  nondiagnostic. There is a 1.5 x 1.1 cm filling defect at the junction of the common bile duct and ampulla (series 5/image 28) with intermediate T2 signal intensity. No additional filling defects in the biliary tree. Pancreas: Mild diffuse pancreatic duct dilation up to 5 mm diameter. There are a few small cystic pancreatic lesions in the pancreatic body and tail, largest 1.1 x 0.7 cm in the pancreatic body (series 5/image 15), without appreciable wall thickening or internal nodularity. No evidence of pancreas divisum. Spleen: Normal size. No mass. Adrenals/Urinary Tract: Normal adrenals. No hydronephrosis. Subcentimeter T2 hyperintense anterior interpolar right renal cortical lesion, presumably a simple renal cysts. No additional renal lesions. Stomach/Bowel: Large hiatal hernia.  Stomach is collapsed and otherwise grossly normal. Visualized small and large bowel is normal caliber, with no bowel wall thickening. Vascular/Lymphatic: Nonaneurysmal abdominal aorta. No pathologically enlarged lymph nodes in the abdomen. Other: No abdominal ascites or focal fluid collection. Musculoskeletal: No aggressive appearing focal osseous lesions. IMPRESSION: 1. Limited significantly motion degraded noncontrast MRI/MRCP. 2. Cholelithiasis. Distended gallbladder. No gallbladder wall thickening or pericholecystic fluid to suggest acute cholecystitis. 3. Moderate diffuse intrahepatic biliary ductal dilatation. Prominently dilated common bile duct (26 mm diameter). Solitary 1.5 x 1.1 cm intermediate signal intensity filling defect at the junction of the CBD and ampulla, which is indeterminate for a stone or mass lesion. Correlation with ERCP advised. 4. Mild diffuse pancreatic duct dilation. Scattered small cystic pancreatic lesions in the pancreatic body and tail, largest 1.1 cm, without overt high risk features by noncontrast MRI. Follow-up MRI abdomen without and with IV contrast recommended in 2 years. This recommendation follows  ACR consensus guidelines: Management of Incidental Pancreatic Cysts: A White Paper of the ACR Incidental Findings Committee. J Am Coll Radiol 0071;21:975-883. 5. Two T2 hyperintense medial segment left liver lobe lesions, largest 1.3 cm, incompletely characterized, most likely benign cysts or hemangiomas. Outpatient follow-up MRI abdomen without and with IV contrast or follow-up CT abdomen with IV contrast recommended in 3-6 months. 6. Large hiatal hernia. Electronically Signed   By: Ilona Sorrel M.D.   On: 11/11/2016 08:04   Dg Chest Port 1 View  Result Date: 11/10/2016 CLINICAL DATA:  Weakness.  Jaundice EXAM: PORTABLE CHEST 1 VIEW COMPARISON:  09/25/2013 FINDINGS: Chronic cardiomegaly and large hiatal hernia. Accounting for artifact there is no convincing consolidation. No edema, effusion, or pneumothorax. Remote and healed proximal left humerus fracture. IMPRESSION: 1. No acute finding. 2. Chronic cardiomegaly and large hiatal hernia. Electronically Signed   By: Monte Fantasia M.D.   On: 11/10/2016 16:09   US Abdomen Limited Ruq  Result Date: 11/11/2016 CLINICAL DATA:  Elevated alkaline phosphatase and bilirubin. Previous CBD stone removed. EXAM: ULTRASOUND ABDOMEN LIMITED RIGHT UPPER QUADRANT COMPARISON:  None. FINDINGS: Gallbladder: Layering gallstones are noted without sonographic Murphy's sign. The largest measures 1.1 cm. The gallbladder wall is top normal in thickness at 3 mm. Common bile duct: Diameter: Dilated common bile duct up to a 2.7 cm with 1.9 cm distal CBD intraluminal focus suspicious for choledocholithiasis. Liver: Intrahepatic ductal dilatation is identified. No space-occupying mass of the liver is noted. Slightly coarsened heterogeneous appearance of the liver parenchyma with calcified granulomata. IMPRESSION: 1. 1.9 cm distal intraluminal focus within the distal common bile duct with intra and extrahepatic ductal dilatation consistent with choledocholithiasis. 2. Uncomplicated  cholelithiasis. 3. Calcified granulomas of the liver. Electronically Signed   By: Ashley Royalty M.D.   On: 11/11/2016 01:46    ROS  As per history of present illness.  Blood pressure (!) 133/57, pulse 83, temperature 98.3 F (36.8 C), temperature source Oral, resp. rate (!) 24, height 5' 4.5" (1.638 m), weight 55.7 kg (122 lb 12.8 oz), SpO2 99 %. Physical Exam  Deeply icteric Alert awake oriented 3 Lungs clear to auscultation with vesicular breath sound Heart first and second audible, irregular rate rhythm Extremities: no edema  Assessment/Plan 1. Total bili 18.8, alkaline phosphatase 722, 1.5X 1.1 cm stone in distal CBD-compatible with cholangitis 2. Cholelithiasis 3. Liver cyst or hemangiomas  On IV Zosyn, Eliquis on hold for at least 24 hours, nothing by mouth post midnight, ERCP today.  Ronnette Juniper, MD 11/11/2016, 11:02 AM

## 2016-11-11 NOTE — Anesthesia Procedure Notes (Signed)
Procedure Name: Intubation Date/Time: 11/11/2016 11:20 AM Performed by: Julieta Bellini Pre-anesthesia Checklist: Patient identified, Emergency Drugs available, Suction available and Patient being monitored Patient Re-evaluated:Patient Re-evaluated prior to inductionOxygen Delivery Method: Circle system utilized Preoxygenation: Pre-oxygenation with 100% oxygen Intubation Type: IV induction Ventilation: Mask ventilation without difficulty Laryngoscope Size: McGraph and 3 Grade View: Grade I Tube type: Oral Tube size: 7.0 mm Number of attempts: 1 Airway Equipment and Method: Stylet Placement Confirmation: ETT inserted through vocal cords under direct vision,  positive ETCO2 and breath sounds checked- equal and bilateral Secured at: 22 cm Tube secured with: Tape Dental Injury: Teeth and Oropharynx as per pre-operative assessment

## 2016-11-11 NOTE — Progress Notes (Signed)
Heparin consult for restart received from [facility] post ERCP with intervention and stent placement.  Called [facility] on call - spoke with Dr. Alessandra Bevels who gave orders to hold IV heparin today and allow Dr. Silverio Decamp who will be following patient tomorrow to determine when it is appropriate to resume anticoagulation.   For now, heparin consult and orders have been discontinued.  Sloan Leiter, PharmD, BCPS Clinical Pharmacist [phone] if questions. 11/11/2016 , 3:49 PM

## 2016-11-11 NOTE — Progress Notes (Signed)
Daily Rounding Note  11/11/2016, 8:01 AM  LOS: 1 day   SUBJECTIVE:   Chief complaint:  Jaundice.  Continues.  Denies abd pain, no nausea.      OBJECTIVE:         Vital signs in last 24 hours:    Temp:  [99 F (37.2 C)-100.6 F (38.1 C)] 99 F (37.2 C) (06/09 0501) Pulse Rate:  [45-103] 83 (06/09 0501) Resp:  [17-40] 18 (06/09 0501) BP: (113-137)/(52-104) 132/61 (06/09 0501) SpO2:  [95 %-100 %] 99 % (06/09 0501) Weight:  [55.7 kg (122 lb 12.8 oz)-56.7 kg (125 lb)] 55.7 kg (122 lb 12.8 oz) (06/09 0501) Last BM Date: 11/10/16 Filed Weights   11/10/16 1435 11/10/16 2027 11/11/16 0501  Weight: 56.7 kg (125 lb) 55.7 kg (122 lb 12.8 oz) 55.7 kg (122 lb 12.8 oz)   General: Jaundiced, comfortable   Heart: Irreg, irreg.   Chest: non-labored breathing Abdomen: not reexamined  Extremities: no CCE Neuro/Psych:  Alert, appropriate, relaxed.    Intake/Output from previous day: 06/08 0701 - 06/09 0700 In: 1273.8 [I.V.:623.8; IV Piggyback:650] Out: -   Intake/Output this shift: No intake/output data recorded.  Lab Results:  Recent Labs  11/10/16 1529 11/11/16 0452  WBC 16.4* 11.7*  HGB 13.3 11.3*  HCT 37.9 33.1*  PLT 291 271   BMET  Recent Labs  11/10/16 1529 11/11/16 0452  NA 130* 133*  K 4.8 4.2  CL 96* 99*  CO2 24 23  GLUCOSE 129* 101*  BUN 16 12  CREATININE 1.35* 1.27*  CALCIUM 8.7* 8.3*   LFT  Recent Labs  11/10/16 1529 11/10/16 2037 11/11/16 0452  PROT 7.5  --  6.3*  ALBUMIN 2.7*  --  2.2*  AST 169*  --  136*  ALT 147*  --  117*  ALKPHOS 843*  --  722*  BILITOT 20.5* 19.6* 18.8*  BILIDIR  --  12.1*  --   IBILI  --  7.5*  --    PT/INR  Recent Labs  11/10/16 2037  LABPROT 18.2*  INR 1.49   Hepatitis Panel No results for input(s): HEPBSAG, HCVAB, HEPAIGM, HEPBIGM in the last 72 hours.  Studies/Results: Dg Chest Port 1 View  Result Date: 11/10/2016 CLINICAL DATA:  Weakness.   Jaundice EXAM: PORTABLE CHEST 1 VIEW COMPARISON:  09/25/2013 FINDINGS: Chronic cardiomegaly and large hiatal hernia. Accounting for artifact there is no convincing consolidation. No edema, effusion, or pneumothorax. Remote and healed proximal left humerus fracture. IMPRESSION: 1. No acute finding. 2. Chronic cardiomegaly and large hiatal hernia. Electronically Signed   By: Monte Fantasia M.D.   On: 11/10/2016 16:09   US Abdomen Limited Ruq  Result Date: 11/11/2016 CLINICAL DATA:  Elevated alkaline phosphatase and bilirubin. Previous CBD stone removed. EXAM: ULTRASOUND ABDOMEN LIMITED RIGHT UPPER QUADRANT COMPARISON:  None. FINDINGS: Gallbladder: Layering gallstones are noted without sonographic Murphy's sign. The largest measures 1.1 cm. The gallbladder wall is top normal in thickness at 3 mm. Common bile duct: Diameter: Dilated common bile duct up to a 2.7 cm with 1.9 cm distal CBD intraluminal focus suspicious for choledocholithiasis. Liver: Intrahepatic ductal dilatation is identified. No space-occupying mass of the liver is noted. Slightly coarsened heterogeneous appearance of the liver parenchyma with calcified granulomata. IMPRESSION: 1. 1.9 cm distal intraluminal focus within the distal common bile duct with intra and extrahepatic ductal dilatation consistent with choledocholithiasis. 2. Uncomplicated cholelithiasis. 3. Calcified granulomas of the liver. Electronically Signed  By: Ashley Royalty M.D.   On: 11/11/2016 01:46   Scheduled Meds: . diltiazem  120 mg Oral Daily  . levothyroxine  100 mcg Oral QAC breakfast  . metoprolol tartrate  25 mg Oral BID  . pantoprazole  40 mg Oral Daily  . sodium chloride flush  3 mL Intravenous Q12H   Continuous Infusions: . sodium chloride 75 mL/hr at 11/10/16 2140  . piperacillin-tazobactam (ZOSYN)  IV 3.375 g (11/11/16 0544)   PRN Meds:.   ASSESMENT:   *  Jaundice, elevated WBC count: cholangitis and recurrent choledocholithiasis.  Previous  choledocholithiasis and 03/2016 ERCP, sphinct and stent placement after unable to extract large stone (per Dr Laural Golden).  05/2016 stent removal, sphinct extension, stone extraction (per Dr Laural Golden). 11/11/16 MRCP: completed but no report.  6/9 ultrasound: choledocholithiasis, cholecystitis, liver granulomas.  Day 2 Zosyn.  Improved WBCs and LFTs,   *  Chronic Eliquis for a fib, on hold. Heparin gtt stopped at 2200 6/8  *  AKI.     PLAN   *  ERCP today.      Azucena Freed  11/11/2016, 8:01 AM Pager: [phone]

## 2016-11-11 NOTE — Transfer of Care (Signed)
Immediate Anesthesia Transfer of Care Note  Patient: Regina [name]  Procedure(s) Performed: Procedure(s) with comments: ENDOSCOPIC RETROGRADE CHOLANGIOPANCREATOGRAPHY (ERCP) (N/A) - PRONE POSITION  Patient Location: PACU  Anesthesia Type:General  Level of Consciousness: drowsy and pateint uncooperative  Airway & Oxygen Therapy: Patient Spontanous Breathing and Patient connected to nasal cannula oxygen  Post-op Assessment: Report given to RN, Post -op Vital signs reviewed and stable and Patient moving all extremities X 4  Post vital signs: Reviewed and stable  Last Vitals:  Vitals:   11/11/16 0501 11/11/16 1045  BP: 132/61 (!) 133/57  Pulse: 83 83  Resp: 18 (!) 24  Temp: 37.2 C 36.8 C    Last Pain:  Vitals:   11/11/16 1045  TempSrc: Oral  PainSc:          Complications: No apparent anesthesia complications

## 2016-11-11 NOTE — Progress Notes (Signed)
Family Medicine Teaching Service Daily Progress Note Intern Pager: [phone]  Patient name: Regina [name] Medical record number: [id number] Date of birth: 05-13-1923 Age: 83 y.o. Gender: female  Primary Care Provider: Sinda Du, MD Consultants: GI Code Status: Full  Pt Overview and Major Events to Date:  6/8- admitted to [facility] with jaundice  Assessment and Plan: Regina [name] is a 83 y.o. female with a past medical history significant for atrial fibrillation, hypothyroidism, choledocholithiasis s/p ERCP (Oct 2017), AVN left hip s/p replacement, hypertension, hiatal hernia and stroke who presented with generalized jaundice in the the setting of elevated liver labs concerning for cholestasis.  #Jaundice and transaminitis, acute- likely secondary to choledocholithiasis  --Continue Zosyn --GI following, appreciate recs- plan for ERCP this morning --Zofran 4 mg prn  --Tramadol 50 mg prn   #Acute Kidney Injury- Improving, Trend 1.35 > 1.27. Likely pre-renal in nature due to poor PO intake prior to hospitalization. Baseline is (0.8-0.9).  --continue NS 75 cc/hr  #Urinary tract infection, acute- was dx outpatient 3-4 days prior to admission and started on amoxicillin. Without symptoms.  --Continue Zosyn  #AMS, Fatigue and weakness, acute, stable - likely secondary to failing liver with a component of encephalopathy. --Monitor mental status --PT/OT  #Hyponatremia, acute - improving. Trend 130 > 133. Likely secondary to poor intake with some gastric loss 2/2 to emesis and renal loss 2/2 increase urinary frequency in the setting of UTI. Patient has normal glucose and protein level. --continue IVF  #Atrial fibrillation, chronic, stable- rate controlled, on eliquis outpatient for anticoagulation --Continue diltiazem 120 mg daily  --Continue metoprolol 25 mg bid --anticoagulation held for ERCP this morning, resume after procedure  #Hypertension- stable --Will continue to  monitor --Continue Metoprolol 25 mg   #Hypothyroidism- stable --Continue levothyroxine 100 mcg daily  #GERD --Continue Protonix 40 mg daily  FEN/GI: NPO w/ sips with meds, NS 75 cc/hr Prophylaxis: held for procedure (was on Heparin drip)  Disposition: pending ERCP  Subjective:  Doing well this morning, denies any pain or nausea. Did not sleep well overnight due to being waken up for imaging studies.  Objective: Temp:  [99 F (37.2 C)-100.6 F (38.1 C)] 99 F (37.2 C) (06/09 0501) Pulse Rate:  [45-103] 83 (06/09 0501) Resp:  [17-40] 18 (06/09 0501) BP: (113-137)/(52-104) 132/61 (06/09 0501) SpO2:  [95 %-100 %] 99 % (06/09 0501) Weight:  [122 lb 12.8 oz (55.7 kg)-125 lb (56.7 kg)] 122 lb 12.8 oz (55.7 kg) (06/09 0501) Physical Exam: General: jaundiced elderly lady in NAD Cardiovascular: irregular rhythm, regular rate Respiratory: CTAB, no increased WOB Abdomen: soft NTND +BS Extremities: no edema or cyanosis  Laboratory:  Recent Labs Lab 11/10/16 1529 11/11/16 0452  WBC 16.4* 11.7*  HGB 13.3 11.3*  HCT 37.9 33.1*  PLT 291 271    Recent Labs Lab 11/10/16 1529 11/10/16 2037 11/11/16 0452  NA 130*  --  133*  K 4.8  --  4.2  CL 96*  --  99*  CO2 24  --  23  BUN 16  --  12  CREATININE 1.35*  --  1.27*  CALCIUM 8.7*  --  8.3*  PROT 7.5  --  6.3*  BILITOT 20.5* 19.6* 18.8*  ALKPHOS 843*  --  722*  ALT 147*  --  117*  AST 169*  --  136*  GLUCOSE 129*  --  101*   Direct Bili 12.1, Indirect 7.5 T Bili 19.6 GGT 985 INR 1.49 PT 18.2 PTT 38 TSH 2.308  Imaging/Diagnostic Tests: Dg Chest Port 1 View  Result Date: 11/10/2016 CLINICAL DATA:  Weakness.  Jaundice EXAM: PORTABLE CHEST 1 VIEW COMPARISON:  09/25/2013 FINDINGS: Chronic cardiomegaly and large hiatal hernia. Accounting for artifact there is no convincing consolidation. No edema, effusion, or pneumothorax. Remote and healed proximal left humerus fracture. IMPRESSION: 1. No acute finding. 2. Chronic  cardiomegaly and large hiatal hernia. Electronically Signed   By: Monte Fantasia M.D.   On: 11/10/2016 16:09   US Abdomen Limited Ruq  Result Date: 11/11/2016 CLINICAL DATA:  Elevated alkaline phosphatase and bilirubin. Previous CBD stone removed. EXAM: ULTRASOUND ABDOMEN LIMITED RIGHT UPPER QUADRANT COMPARISON:  None. FINDINGS: Gallbladder: Layering gallstones are noted without sonographic Murphy's sign. The largest measures 1.1 cm. The gallbladder wall is top normal in thickness at 3 mm. Common bile duct: Diameter: Dilated common bile duct up to a 2.7 cm with 1.9 cm distal CBD intraluminal focus suspicious for choledocholithiasis. Liver: Intrahepatic ductal dilatation is identified. No space-occupying mass of the liver is noted. Slightly coarsened heterogeneous appearance of the liver parenchyma with calcified granulomata. IMPRESSION: 1. 1.9 cm distal intraluminal focus within the distal common bile duct with intra and extrahepatic ductal dilatation consistent with choledocholithiasis. 2. Uncomplicated cholelithiasis. 3. Calcified granulomas of the liver. Electronically Signed   By: Ashley Royalty M.D.   On: 11/11/2016 01:46    Steve Rattler, DO 11/11/2016, 7:59 AM PGY-1, [place] Intern pager: [phone], text pages welcome

## 2016-11-11 NOTE — Discharge Summary (Signed)
[facility] Discharge Summary  Patient name: Regina [name] Medical record number: [id number] Date of birth: 1922-08-23 Age: 83 y.o. Gender: female Date of Admission: 11/10/2016  Date of Discharge: 11/13/16  Admitting Physician: Zenia Resides, MD  Primary Care Provider: Sinda Du, MD Consultants: GI  Indication for Hospitalization: jaundice  Discharge Diagnoses/Problem List:  Choledocholithiasis  Hyponatremia AKI Anemia Afib UTI Weakness HTN Hypothyroidism GERD  Disposition: home  Discharge Condition: stable, improved  Discharge Exam: see progress note from day of discharge  Brief Hospital Course:  Regina [name] is a 83 year old female with PMH of choledocholithiasis with prior ERCP and stenting who presented to [facility] ED with jaundice. She was admitted to [facility] for management. She was started on Zosyn due to concern for Cholangitis. Sodium was low on admission and patient had an AKI, likely due to poor oral intake prior to hospitalization. She was started on normal saline to correct these abnormalities. RUQ US demonstrated a 1.9 cm stone in the CBD. GI was consulted and performed an ERCP on 6/9 that was unsuccessful. Regina [name] improved afterwards and no further procedures were done. GI recommended a cholecystectomy but patient and her family did not want to pursue this procedure at this time. They will follow up with outpatient GI. Regina [name] was switched to oral Augmentin to complete a 7 day course of antibiotics. She was discharged home on 6/11 in stable condition.  Issues for Follow Up:  1. Recommend outpatient cholecystectomy as gallstones present 2. Recommend checking BMP to follow kidney function and sodium 3. Recommend checking CBC to follow hemoglobin 4. Ensure patient completed course of antibiotics, discharged with 4 additional days of Augmentin  Significant Procedures: ERCP 6/9  Significant Labs and Imaging:   Recent  Labs Lab 11/11/16 0452 11/12/16 0403 11/13/16 0205  WBC 11.7* 11.4* 9.2  HGB 11.3* 9.8* 10.7*  HCT 33.1* 29.8* 32.4*  PLT 271 279 333    Recent Labs Lab 11/10/16 1529 11/11/16 0452 11/12/16 1125 11/13/16 0205  NA 130* 133* 135 136  K 4.8 4.2 3.9 3.7  CL 96* 99* 106 106  CO2 24 23 21* 21*  GLUCOSE 129* 101* 135* 99  BUN 16 12 14 12   CREATININE 1.35* 1.27* 1.49* 1.49*  CALCIUM 8.7* 8.3* 8.0* 8.1*  ALKPHOS 843* 722* 530* 527*  AST 169* 136* 73* 62*  ALT 147* 117* 79* 76*  ALBUMIN 2.7* 2.2* 2.0* 2.1*   Mr Abdomen Mrcp Wo Contrast  Result Date: 11/11/2016 CLINICAL DATA:  Inpatient. Jaundice and nausea. Cholelithiasis, dilated CBD and choledocholithiasis on abdominal sonogram from earlier today. Cholangitis. EXAM: MRI ABDOMEN WITHOUT CONTRAST  (INCLUDING MRCP) TECHNIQUE: Multiplanar multisequence MR imaging of the abdomen was performed. Heavily T2-weighted images of the biliary and pancreatic ducts were obtained, and three-dimensional MRCP images were rendered by post processing. COMPARISON:  11/11/2016 right upper quadrant abdominal sonogram. FINDINGS: Significantly motion degraded scan. Lower chest: Grossly clear lung bases. Hepatobiliary: Normal liver size and configuration. No hepatic steatosis. There are two similar T2 hyperintense T1 hypointense medial segment left liver lobe lesions measuring 1.3 cm (series 6/ image 12) and 1.1 cm (series 6/image 8). No additional liver lesions. Multiple layering gallstones in the distended gallbladder measuring up to 8 mm in diameter. No gallbladder wall thickening or pericholecystic fluid. Moderate diffuse intrahepatic biliary ductal dilatation. Prominently dilated common bile duct (26 mm diameter). The MRCP sequence is degraded by motion and nondiagnostic. There is a 1.5 x 1.1 cm filling  defect at the junction of the common bile duct and ampulla (series 5/image 28) with intermediate T2 signal intensity. No additional filling defects in the biliary  tree. Pancreas: Mild diffuse pancreatic duct dilation up to 5 mm diameter. There are a few small cystic pancreatic lesions in the pancreatic body and tail, largest 1.1 x 0.7 cm in the pancreatic body (series 5/image 15), without appreciable wall thickening or internal nodularity. No evidence of pancreas divisum. Spleen: Normal size. No mass. Adrenals/Urinary Tract: Normal adrenals. No hydronephrosis. Subcentimeter T2 hyperintense anterior interpolar right renal cortical lesion, presumably a simple renal cysts. No additional renal lesions. Stomach/Bowel: Large hiatal hernia. Stomach is collapsed and otherwise grossly normal. Visualized small and large bowel is normal caliber, with no bowel wall thickening. Vascular/Lymphatic: Nonaneurysmal abdominal aorta. No pathologically enlarged lymph nodes in the abdomen. Other: No abdominal ascites or focal fluid collection. Musculoskeletal: No aggressive appearing focal osseous lesions. IMPRESSION: 1. Limited significantly motion degraded noncontrast MRI/MRCP. 2. Cholelithiasis. Distended gallbladder. No gallbladder wall thickening or pericholecystic fluid to suggest acute cholecystitis. 3. Moderate diffuse intrahepatic biliary ductal dilatation. Prominently dilated common bile duct (26 mm diameter). Solitary 1.5 x 1.1 cm intermediate signal intensity filling defect at the junction of the CBD and ampulla, which is indeterminate for a stone or mass lesion. Correlation with ERCP advised. 4. Mild diffuse pancreatic duct dilation. Scattered small cystic pancreatic lesions in the pancreatic body and tail, largest 1.1 cm, without overt high risk features by noncontrast MRI. Follow-up MRI abdomen without and with IV contrast recommended in 2 years. This recommendation follows ACR consensus guidelines: Management of Incidental Pancreatic Cysts: A White Paper of the ACR Incidental Findings Committee. J Am Coll Radiol 5009;38:182-993. 5. Two T2 hyperintense medial segment left liver  lobe lesions, largest 1.3 cm, incompletely characterized, most likely benign cysts or hemangiomas. Outpatient follow-up MRI abdomen without and with IV contrast or follow-up CT abdomen with IV contrast recommended in 3-6 months. 6. Large hiatal hernia. Electronically Signed   By: Ilona Sorrel M.D.   On: 11/11/2016 08:04   Mr 3d Recon At Scanner  Result Date: 11/11/2016 CLINICAL DATA:  Inpatient. Jaundice and nausea. Cholelithiasis, dilated CBD and choledocholithiasis on abdominal sonogram from earlier today. Cholangitis. EXAM: MRI ABDOMEN WITHOUT CONTRAST  (INCLUDING MRCP) TECHNIQUE: Multiplanar multisequence MR imaging of the abdomen was performed. Heavily T2-weighted images of the biliary and pancreatic ducts were obtained, and three-dimensional MRCP images were rendered by post processing. COMPARISON:  11/11/2016 right upper quadrant abdominal sonogram. FINDINGS: Significantly motion degraded scan. Lower chest: Grossly clear lung bases. Hepatobiliary: Normal liver size and configuration. No hepatic steatosis. There are two similar T2 hyperintense T1 hypointense medial segment left liver lobe lesions measuring 1.3 cm (series 6/ image 12) and 1.1 cm (series 6/image 8). No additional liver lesions. Multiple layering gallstones in the distended gallbladder measuring up to 8 mm in diameter. No gallbladder wall thickening or pericholecystic fluid. Moderate diffuse intrahepatic biliary ductal dilatation. Prominently dilated common bile duct (26 mm diameter). The MRCP sequence is degraded by motion and nondiagnostic. There is a 1.5 x 1.1 cm filling defect at the junction of the common bile duct and ampulla (series 5/image 28) with intermediate T2 signal intensity. No additional filling defects in the biliary tree. Pancreas: Mild diffuse pancreatic duct dilation up to 5 mm diameter. There are a few small cystic pancreatic lesions in the pancreatic body and tail, largest 1.1 x 0.7 cm in the pancreatic body (series  5/image 15), without appreciable wall thickening or  internal nodularity. No evidence of pancreas divisum. Spleen: Normal size. No mass. Adrenals/Urinary Tract: Normal adrenals. No hydronephrosis. Subcentimeter T2 hyperintense anterior interpolar right renal cortical lesion, presumably a simple renal cysts. No additional renal lesions. Stomach/Bowel: Large hiatal hernia. Stomach is collapsed and otherwise grossly normal. Visualized small and large bowel is normal caliber, with no bowel wall thickening. Vascular/Lymphatic: Nonaneurysmal abdominal aorta. No pathologically enlarged lymph nodes in the abdomen. Other: No abdominal ascites or focal fluid collection. Musculoskeletal: No aggressive appearing focal osseous lesions. IMPRESSION: 1. Limited significantly motion degraded noncontrast MRI/MRCP. 2. Cholelithiasis. Distended gallbladder. No gallbladder wall thickening or pericholecystic fluid to suggest acute cholecystitis. 3. Moderate diffuse intrahepatic biliary ductal dilatation. Prominently dilated common bile duct (26 mm diameter). Solitary 1.5 x 1.1 cm intermediate signal intensity filling defect at the junction of the CBD and ampulla, which is indeterminate for a stone or mass lesion. Correlation with ERCP advised. 4. Mild diffuse pancreatic duct dilation. Scattered small cystic pancreatic lesions in the pancreatic body and tail, largest 1.1 cm, without overt high risk features by noncontrast MRI. Follow-up MRI abdomen without and with IV contrast recommended in 2 years. This recommendation follows ACR consensus guidelines: Management of Incidental Pancreatic Cysts: A White Paper of the ACR Incidental Findings Committee. J Am Coll Radiol 2376;28:315-176. 5. Two T2 hyperintense medial segment left liver lobe lesions, largest 1.3 cm, incompletely characterized, most likely benign cysts or hemangiomas. Outpatient follow-up MRI abdomen without and with IV contrast or follow-up CT abdomen with IV contrast  recommended in 3-6 months. 6. Large hiatal hernia. Electronically Signed   By: Ilona Sorrel M.D.   On: 11/11/2016 08:04   Dg Chest Port 1 View  Result Date: 11/10/2016 CLINICAL DATA:  Weakness.  Jaundice EXAM: PORTABLE CHEST 1 VIEW COMPARISON:  09/25/2013 FINDINGS: Chronic cardiomegaly and large hiatal hernia. Accounting for artifact there is no convincing consolidation. No edema, effusion, or pneumothorax. Remote and healed proximal left humerus fracture. IMPRESSION: 1. No acute finding. 2. Chronic cardiomegaly and large hiatal hernia. Electronically Signed   By: Monte Fantasia M.D.   On: 11/10/2016 16:09   Dg Ercp Biliary & Pancreatic Ducts  Result Date: 11/11/2016 CLINICAL DATA:  ERCP with biliary sweeping, sphincterotomy and stent placement. EXAM: ERCP TECHNIQUE: Multiple spot images obtained with the fluoroscopic device and submitted for interpretation post-procedure. COMPARISON:  MRCP -11/11/2016 FINDINGS: 3 spot intraoperative fluoroscopic images of the right upper abdominal quadrant during ERCP are provided for review. Initial image demonstrates an ERCP probe overlying the right upper abdominal quadrant. Subsequent images demonstrate selective cannulation opacification the distal aspect of the common bile duct which appears markedly dilated. Completion image demonstrates placement of a plastic biliary stent overlying the distal aspect of the CBD with persistent tapering of the distal aspect of the CBD. IMPRESSION: ERCP with biliary stent placement as above. These images were submitted for radiologic interpretation only. Please see the procedural report for the amount of contrast and the fluoroscopy time utilized. Electronically Signed   By: Sandi Mariscal M.D.   On: 11/11/2016 13:19   US Abdomen Limited Ruq  Result Date: 11/11/2016 CLINICAL DATA:  Elevated alkaline phosphatase and bilirubin. Previous CBD stone removed. EXAM: ULTRASOUND ABDOMEN LIMITED RIGHT UPPER QUADRANT COMPARISON:  None. FINDINGS:  Gallbladder: Layering gallstones are noted without sonographic Murphy's sign. The largest measures 1.1 cm. The gallbladder wall is top normal in thickness at 3 mm. Common bile duct: Diameter: Dilated common bile duct up to a 2.7 cm with 1.9 cm distal CBD intraluminal focus  suspicious for choledocholithiasis. Liver: Intrahepatic ductal dilatation is identified. No space-occupying mass of the liver is noted. Slightly coarsened heterogeneous appearance of the liver parenchyma with calcified granulomata. IMPRESSION: 1. 1.9 cm distal intraluminal focus within the distal common bile duct with intra and extrahepatic ductal dilatation consistent with choledocholithiasis. 2. Uncomplicated cholelithiasis. 3. Calcified granulomas of the liver. Electronically Signed   By: Ashley Royalty M.D.   On: 11/11/2016 01:46   Results/Tests Pending at Time of Discharge: none  Discharge Medications:  Allergies as of 11/13/2016   No Known Allergies     Medication List    STOP taking these medications   amoxicillin 500 MG capsule Commonly known as:  AMOXIL     TAKE these medications   acetaminophen 325 MG tablet Commonly known as:  TYLENOL Take 325 mg by mouth every 6 (six) hours as needed for headache (pain).   amoxicillin-clavulanate 875-125 MG tablet Commonly known as:  AUGMENTIN Take 1 tablet by mouth 2 (two) times daily.   apixaban 2.5 MG Tabs tablet Commonly known as:  ELIQUIS Take 1 tablet (2.5 mg total) by mouth 2 (two) times daily.   clotrimazole-betamethasone cream Commonly known as:  LOTRISONE Apply 1 application topically 2 (two) times daily as needed (rash).   diltiazem 120 MG 24 hr capsule Commonly known as:  CARDIZEM CD take 1 capsule by mouth once daily   levothyroxine 100 MCG tablet Commonly known as:  SYNTHROID, LEVOTHROID Take 100 mcg by mouth daily.   metoprolol tartrate 25 MG tablet Commonly known as:  LOPRESSOR take 1 tablet by mouth twice a day   metoprolol tartrate 25 MG  tablet Commonly known as:  LOPRESSOR take 1 tablet twice a day   pantoprazole 40 MG tablet Commonly known as:  PROTONIX Take 40 mg by mouth daily.            Durable Medical Equipment        Start     Ordered   11/12/16 2148  For home use only DME Bedside commode  Once    Comments:  3 in 1  Question:  Patient needs a bedside commode to treat with the following condition  Answer:  Weakness   11/12/16 2148      Discharge Instructions: Please refer to Patient Instructions section of EMR for full details.  Patient was counseled important signs and symptoms that should prompt return to medical care, changes in medications, dietary instructions, activity restrictions, and follow up appointments.   Follow-Up Appointments: Follow-up Information    Sinda Du, MD. Schedule an appointment as soon as possible for a visit.   Specialty:  Pulmonary Disease Why:  for hospital follow up Contact information: [address] [place] [address] [phone]        Rogene Houston, MD. Schedule an appointment as soon as possible for a visit.   Specialty:  Gastroenterology Why:  for hospital fup  Contact information: [place], [address] [place] [address] [phone]           Elver Stadler, [place], DO 11/13/2016, 11:03 AM PGY-1, [place]

## 2016-11-11 NOTE — Progress Notes (Signed)
Pt back from [facility] and oriented no s/s of distress.

## 2016-11-12 DIAGNOSIS — R1011 Right upper quadrant pain: Secondary | ICD-10-CM

## 2016-11-12 LAB — COMPREHENSIVE METABOLIC PANEL
ALK PHOS: 530 U/L — AB (ref 38–126)
ALT: 79 U/L — AB (ref 14–54)
AST: 73 U/L — ABNORMAL HIGH (ref 15–41)
Albumin: 2 g/dL — ABNORMAL LOW (ref 3.5–5.0)
Anion gap: 8 (ref 5–15)
BILIRUBIN TOTAL: 11 mg/dL — AB (ref 0.3–1.2)
BUN: 14 mg/dL (ref 6–20)
CALCIUM: 8 mg/dL — AB (ref 8.9–10.3)
CO2: 21 mmol/L — ABNORMAL LOW (ref 22–32)
CREATININE: 1.49 mg/dL — AB (ref 0.44–1.00)
Chloride: 106 mmol/L (ref 101–111)
GFR, EST AFRICAN AMERICAN: 34 mL/min — AB (ref >60)
GFR, EST NON AFRICAN AMERICAN: 29 mL/min — AB (ref >60)
Glucose, Bld: 135 mg/dL — ABNORMAL HIGH (ref 65–99)
Potassium: 3.9 mmol/L (ref 3.5–5.1)
Sodium: 135 mmol/L (ref 135–145)
Total Protein: 6.3 g/dL — ABNORMAL LOW (ref 6.5–8.1)

## 2016-11-12 LAB — CBC
HEMATOCRIT: 29.8 % — AB (ref 36.0–46.0)
HEMOGLOBIN: 9.8 g/dL — AB (ref 12.0–15.0)
MCH: 28.6 pg (ref 26.0–34.0)
MCHC: 32.9 g/dL (ref 30.0–36.0)
MCV: 86.9 fL (ref 78.0–100.0)
Platelets: 279 10*3/uL (ref 150–400)
RBC: 3.43 MIL/uL — AB (ref 3.87–5.11)
RDW: 15.4 % (ref 11.5–15.5)
WBC: 11.4 10*3/uL — ABNORMAL HIGH (ref 4.0–10.5)

## 2016-11-12 LAB — URINE CULTURE

## 2016-11-12 NOTE — Progress Notes (Signed)
Family Medicine Teaching Service Daily Progress Note Intern Pager: [phone]  Patient name: Regina [name] Medical record number: [id number] Date of birth: Oct 08, 1922 Age: 83 y.o. Gender: female  Primary Care Provider: Sinda Du, MD Consultants: GI Code Status: Full  Pt Overview and Major Events to Date:  6/8- admitted to [facility] with jaundice 6/9- attempted ERCP unsuccessful  Assessment and Plan: Regina [name] is a 83 y.o. female with a past medical history significant for atrial fibrillation, hypothyroidism, choledocholithiasis s/p ERCP (Oct 2017), AVN left hip s/p replacement, hypertension, hiatal hernia and stroke who presented with generalized jaundice in the the setting of elevated liver labs concerning for cholestasis.  #Jaundice and transaminitis, acute- likely secondary to choledocholithiasis  -Continue Zosyn -GI following, appreciate recs- plan for repeat ERCP with possibly lithotripsy - patient will need cholecystectomy after ERCP -Zofran 4 mg prn  -Tramadol 50 mg prn   #Acute Kidney Injury- Improving, Trend 1.35 > 1.27. Likely pre-renal in nature due to poor PO intake prior to hospitalization. Baseline is (0.8-0.9).  -continue NS 50 cc/hr   #Urinary tract infection, acute- was dx outpatient 3-4 days prior to admission and started on amoxicillin. Without symptoms.  - Continue Zosyn - urine culture pending  #AMS, Fatigue and weakness, acute, stable - likely secondary to failing liver with a component of encephalopathy. - Monitor mental status - PT/OT  #Anemia - Hgb on admission 13.3 > 11.3 > 9.8, no signs of active bleeding -continue to monitor -trend CBC -transfusion threshold < 8 -FOBT ordered  #Hyponatremia, acute - improving. Trend 130 > 133. Likely secondary to poor intake with some gastric loss 2/2 to emesis and renal loss 2/2 increase urinary frequency in the setting of UTI. Patient has normal glucose and protein level. --continue IVF  #Atrial  fibrillation, chronic, stable- rate controlled, on eliquis outpatient for anticoagulation --Continue diltiazem 120 mg daily  --Continue metoprolol 25 mg bid --anticoagulation held for ERCP second attempt, resume after procedure  #Hypertension- stable --Will continue to monitor --Continue Metoprolol 25 mg   #Hypothyroidism- stable --Continue levothyroxine 100 mcg daily  #GERD --Continue Protonix 40 mg daily  FEN/GI: NPO w/ sips with meds, NS 50 cc/hr Prophylaxis: held for procedure (was on Heparin drip)  Disposition: pending repeat ERCP, clinical improvement  Subjective:  Doing well, no complaints or concerns at this time. No family at bedside.   Objective: Temp:  [97.2 F (36.2 C)-98.3 F (36.8 C)] 98 F (36.7 C) (06/10 0410) Pulse Rate:  [70-88] 77 (06/10 0410) Resp:  [19-24] 19 (06/10 0410) BP: (90-133)/(40-61) 116/54 (06/10 0410) SpO2:  [93 %-99 %] 96 % (06/10 0410) Physical Exam: General: jaundiced elderly lady in NAD Cardiovascular: irregular rhythm, regular rate Respiratory: CTAB, no increased WOB Abdomen: soft NTND +BS Extremities: no edema or cyanosis  Laboratory:  Recent Labs Lab 11/10/16 1529 11/11/16 0452 11/12/16 0403  WBC 16.4* 11.7* 11.4*  HGB 13.3 11.3* 9.8*  HCT 37.9 33.1* 29.8*  PLT 291 271 279    Recent Labs Lab 11/10/16 1529 11/10/16 2037 11/11/16 0452  NA 130*  --  133*  K 4.8  --  4.2  CL 96*  --  99*  CO2 24  --  23  BUN 16  --  12  CREATININE 1.35*  --  1.27*  CALCIUM 8.7*  --  8.3*  PROT 7.5  --  6.3*  BILITOT 20.5* 19.6* 18.8*  ALKPHOS 843*  --  722*  ALT 147*  --  117*  AST 169*  --  136*  GLUCOSE 129*  --  101*   Direct Bili 12.1, Indirect 7.5 T Bili 19.6 GGT 985 INR 1.49 PT 18.2 PTT 38 TSH 2.308  Imaging/Diagnostic Tests: Dg Ercp Biliary & Pancreatic Ducts  Result Date: 11/11/2016 CLINICAL DATA:  ERCP with biliary sweeping, sphincterotomy and stent placement. EXAM: ERCP TECHNIQUE: Multiple spot images  obtained with the fluoroscopic device and submitted for interpretation post-procedure. COMPARISON:  MRCP -11/11/2016 FINDINGS: 3 spot intraoperative fluoroscopic images of the right upper abdominal quadrant during ERCP are provided for review. Initial image demonstrates an ERCP probe overlying the right upper abdominal quadrant. Subsequent images demonstrate selective cannulation opacification the distal aspect of the common bile duct which appears markedly dilated. Completion image demonstrates placement of a plastic biliary stent overlying the distal aspect of the CBD with persistent tapering of the distal aspect of the CBD. IMPRESSION: ERCP with biliary stent placement as above. These images were submitted for radiologic interpretation only. Please see the procedural report for the amount of contrast and the fluoroscopy time utilized. Electronically Signed   By: Sandi Mariscal M.D.   On: 11/11/2016 13:19   US Abdomen Limited Ruq  Result Date: 11/11/2016 CLINICAL DATA:  Elevated alkaline phosphatase and bilirubin. Previous CBD stone removed. EXAM: ULTRASOUND ABDOMEN LIMITED RIGHT UPPER QUADRANT COMPARISON:  None. FINDINGS: Gallbladder: Layering gallstones are noted without sonographic Murphy's sign. The largest measures 1.1 cm. The gallbladder wall is top normal in thickness at 3 mm. Common bile duct: Diameter: Dilated common bile duct up to a 2.7 cm with 1.9 cm distal CBD intraluminal focus suspicious for choledocholithiasis. Liver: Intrahepatic ductal dilatation is identified. No space-occupying mass of the liver is noted. Slightly coarsened heterogeneous appearance of the liver parenchyma with calcified granulomata. IMPRESSION: 1. 1.9 cm distal intraluminal focus within the distal common bile duct with intra and extrahepatic ductal dilatation consistent with choledocholithiasis. 2. Uncomplicated cholelithiasis. 3. Calcified granulomas of the liver. Electronically Signed   By: Ashley Royalty M.D.   On: 11/11/2016  01:46   Steve Rattler, DO 11/12/2016, 7:02 AM PGY-1, [place] Intern pager: [phone], text pages welcome

## 2016-11-12 NOTE — Evaluation (Signed)
Physical Therapy Evaluation Patient Details Name: Regina [name] MRN: [id number] DOB: 10-16-1922 Today's Date: 11/12/2016   History of Present Illness  83 year old female presented with jaundice progressively worsening for one week associated with fatigue and decreased appetite. PMH of arthritis, stroke, HTN, and LTHA (2015).  Clinical Impression  Pt presents with the above diagnosis and below deficits for therapy evaluation. Prior to admission, pt lived in a two-story home with her main environment on the first floor. Pt's son and daughter in law live close by and are available to assist 24hrs a day. Pt is able to mobilize and perform bed mobs with supervision this session. No LOB with RW noted. Pt will benefit from continued acute rehab follow up and HHPT at discharge.     Follow Up Recommendations Home health PT    Equipment Recommendations  None recommended by PT    Recommendations for Other Services       Precautions / Restrictions Precautions Precautions: Fall Restrictions Weight Bearing Restrictions: No      Mobility  Bed Mobility Overal bed mobility: Needs Assistance Bed Mobility: Supine to Sit;Sit to Supine     Supine to sit: Supervision;HOB elevated Sit to supine: Supervision   General bed mobility comments: Pt used bedrails to pull herself in bed. Pt family reports that she has bedrails at home  Transfers Overall transfer level: Needs assistance Equipment used: Rolling walker (2 wheeled) Transfers: Sit to/from Stand Sit to Stand: Supervision         General transfer comment: Good handplacment and push up into standing  Ambulation/Gait Ambulation/Gait assistance: Supervision Ambulation Distance (Feet): 25 Feet Assistive device: Rolling walker (2 wheeled) Gait Pattern/deviations: Step-through pattern Gait velocity: decreased Gait velocity interpretation: Below normal speed for age/gender General Gait Details: slight forward trunk lean, but overall  safe mobility  Stairs            Wheelchair Mobility    Modified Rankin (Stroke Patients Only)       Balance Overall balance assessment: Needs assistance Sitting-balance support: Feet supported;No upper extremity supported Sitting balance-Leahy Scale: Good Sitting balance - Comments: Performed LB dressing   Standing balance support: During functional activity;No upper extremity supported Standing balance-Leahy Scale: Fair Standing balance comment: able to stand briefly without holding RW                             Pertinent Vitals/Pain Pain Assessment: No/denies pain    Home Living Family/patient expects to be discharged to:: Private residence Living Arrangements: Alone Available Help at Discharge: Family;Available 24 hours/day Type of Home: House Home Access: Stairs to enter Entrance Stairs-Rails: Psychiatric nurse of Steps: 2 Home Layout: Two level;Able to live on main level with bedroom/bathroom Home Equipment: Gilford Rile - 2 wheels;Wheelchair - manual;Cane - single point;Bedside commode;Shower seat;Grab bars - toilet;Hand held shower head;Other (comment) (bedrails)      Prior Function Level of Independence: Needs assistance   Gait / Transfers Assistance Needed: uses RW for mobility throughout home  ADL's / Homemaking Assistance Needed: Performs her basic ADLs. Has someone there to when she showers. Has family right next door helping with all activities        Hand Dominance   Dominant Hand: Right    Extremity/Trunk Assessment   Upper Extremity Assessment Upper Extremity Assessment: Defer to OT evaluation    Lower Extremity Assessment Lower Extremity Assessment: Generalized weakness       Communication   Communication: No difficulties  Cognition Arousal/Alertness: Awake/alert Behavior During Therapy: WFL for tasks assessed/performed Overall Cognitive Status: Within Functional Limits for tasks assessed                                  General Comments: Per family, "she is a litte more loopy and gitty than normal"      General Comments General comments (skin integrity, edema, etc.): Family present for sesison    Exercises     Assessment/Plan    PT Assessment Patient needs continued PT services  PT Problem List Decreased activity tolerance;Decreased balance;Decreased mobility       PT Treatment Interventions DME instruction;Functional mobility training;Stair training;Gait training;Therapeutic activities;Therapeutic exercise;Balance training    PT Goals (Current goals can be found in the Care Plan section)  Acute Rehab PT Goals Patient Stated Goal: Go home PT Goal Formulation: With patient Time For Goal Achievement: 11/19/16 Potential to Achieve Goals: Good    Frequency Min 3X/week   Barriers to discharge        Co-evaluation               AM-PAC PT "6 Clicks" Daily Activity  Outcome Measure Difficulty turning over in bed (including adjusting bedclothes, sheets and blankets)?: A Little Difficulty moving from lying on back to sitting on the side of the bed? : A Little Difficulty sitting down on and standing up from a chair with arms (e.g., wheelchair, bedside commode, etc,.)?: Total Help needed moving to and from a bed to chair (including a wheelchair)?: A Little Help needed walking in hospital room?: A Little Help needed climbing 3-5 steps with a railing? : A Lot 6 Click Score: 15    End of Session Equipment Utilized During Treatment: Gait belt Activity Tolerance: Patient tolerated treatment well Patient left: in bed;with call bell/phone within reach;with bed alarm set;with family/visitor present Nurse Communication: Mobility status PT Visit Diagnosis: Difficulty in walking, not elsewhere classified (R26.2)    Time: 3009-2330 PT Time Calculation (min) (ACUTE ONLY): 22 min   Charges:   PT Evaluation $PT Eval Moderate Complexity: 1 Procedure     PT G  Codes:        Scheryl Marten PT, DPT  [phone]   Shanon Rosser 11/12/2016, 4:46 PM

## 2016-11-12 NOTE — Evaluation (Signed)
Occupational Therapy Evaluation Patient Details Name: Regina [name] MRN: [id number] DOB: [birth year] Today's Date: 11/12/2016    History of Present Illness 83 year old female presented with jaundice progressively worsening for one week associated with fatigue and decreased appetite. PMH of arthritis, stroke, HTN, and LTHA (2015).   Clinical Impression   PTA, pt was living alone with family near by and checking in regularly. She was independent with her ADLs and family would come to supervise bathing. Currently, pt requires supervision for ADLs and functional mobility using RW. Answered all pt and family questions about dc planning and needed DME. Recommend dc home with HHOT to increase pt safety and independence with ADLs and functional mobility. All acute OT needs met and will defer to next venue. Thank you.     Follow Up Recommendations  Home health OT;Supervision/Assistance - 24 hour    Equipment Recommendations  3 in 1 bedside commode    Recommendations for Other Services PT consult     Precautions / Restrictions Precautions Precautions: Fall Restrictions Weight Bearing Restrictions: No      Mobility Bed Mobility Overal bed mobility: Needs Assistance Bed Mobility: Supine to Sit;Sit to Supine     Supine to sit: Supervision;HOB elevated Sit to supine: Supervision   General bed mobility comments: Pt used bedrails to pull herself in bed. Pt family reports that she has bedrails at home  Transfers Overall transfer level: Needs assistance Equipment used: Rolling walker (2 wheeled) Transfers: Sit to/from Stand Sit to Stand: Supervision         General transfer comment: Good handplacment and push up into standing    Balance Overall balance assessment: Needs assistance Sitting-balance support: Feet supported;No upper extremity supported Sitting balance-Leahy Scale: Good Sitting balance - Comments: Performed LB dressing   Standing balance support: During functional  activity;No upper extremity supported Standing balance-Leahy Scale: Fair Standing balance comment: Able to perform grooming without UE support. Requires RW for fucntional mobility                           ADL either performed or assessed with clinical judgement   ADL Overall ADL's : Needs assistance/impaired (Near baseline)                                       General ADL Comments: Supervision for ADLs and fucntional mobility using RW. Pt performed toileting, hand hygiene, and LB dressing with supervision, RW, and increased time.      Vision         Perception     Praxis      Pertinent Vitals/Pain Pain Assessment: No/denies pain     Hand Dominance Right   Extremity/Trunk Assessment Upper Extremity Assessment Upper Extremity Assessment: Generalized weakness   Lower Extremity Assessment Lower Extremity Assessment: Generalized weakness       Communication Communication Communication: No difficulties   Cognition Arousal/Alertness: Awake/alert Behavior During Therapy: WFL for tasks assessed/performed Overall Cognitive Status: Within Functional Limits for tasks assessed                                 General Comments: Per family, "she is a litte more loopy and gitty than normal"   General Comments  Family present for sesison    Exercises     Shoulder Instructions  Home Living Family/patient expects to be discharged to:: Private residence Living Arrangements: Alone Available Help at Discharge: Family;Available 24 hours/day Type of Home: House Home Access: Stairs to enter [employer] of Steps: 2 Entrance Stairs-Rails: Right;Left Home Layout: Two level;Able to live on main level with bedroom/bathroom   Alternate Level Stairs-Rails: Right Bathroom Shower/Tub: Occupational psychologist: Standard     Home Equipment: Environmental consultant - 2 wheels;Wheelchair - manual;Cane - single point;Bedside  commode;Shower seat;Grab bars - toilet;Hand held shower head;Other (comment) (Bedrails)          Prior Functioning/Environment Level of Independence: Needs assistance  Gait / Transfers Assistance Needed: uses RW for mobility throughout home ADL's / Homemaking Assistance Needed: Performs her basic ADLs. Has someone there to when she showers. Has family right next door helping with all activities            OT Problem List: Decreased strength;Decreased activity tolerance;Impaired balance (sitting and/or standing);Decreased safety awareness;Decreased knowledge of use of DME or AE;Decreased knowledge of precautions      OT Treatment/Interventions:      OT Goals(Current goals can be found in the care plan section) Acute Rehab OT Goals Patient Stated Goal: Go home OT Goal Formulation: With patient Time For Goal Achievement: 11/26/16 Potential to Achieve Goals: Good  OT Frequency:     Barriers to D/C:            Co-evaluation              AM-PAC PT "6 Clicks" Daily Activity     Outcome Measure Help from another person eating meals?: None Help from another person taking care of personal grooming?: A Little Help from another person toileting, which includes using toliet, bedpan, or urinal?: A Little Help from another person bathing (including washing, rinsing, drying)?: A Little Help from another person to put on and taking off regular upper body clothing?: A Little Help from another person to put on and taking off regular lower body clothing?: A Little 6 Click Score: 19   End of Session Equipment Utilized During Treatment: Rolling walker Nurse Communication: Mobility status;Other (comment) (bleeding at IV site)  Activity Tolerance: Patient tolerated treatment well Patient left: in bed;with nursing/sitter in room;with family/visitor present;with call bell/phone within reach  OT Visit Diagnosis: Unsteadiness on feet (R26.81);Other abnormalities of gait and mobility  (R26.89);Muscle weakness (generalized) (M62.81)                Time: 8264-1583 OT Time Calculation (min): 18 min Charges:  OT General Charges $OT Visit: 1 Procedure OT Evaluation $OT Eval Low Complexity: 1 Procedure G-Codes:     Watt Geiler MSOT, OTR/L Acute Rehab Pager: [phone] Office: [place] 11/12/2016, 4:19 PM

## 2016-11-12 NOTE — Progress Notes (Addendum)
Daily Rounding Note  11/12/2016, 10:26 AM  LOS: 2 days   SUBJECTIVE:   Chief complaint:  Painless jaundice.     Last pain meds at midnight on 6/9.    Diet clears: tolerating.  Appetite a bit better but still depressed.  Still no pain.  No nausea.  Malaise improved.     OBJECTIVE:         Vital signs in last 24 hours:    Temp:  [97.2 F (36.2 C)-98.3 F (36.8 C)] 98 F (36.7 C) (06/10 0410) Pulse Rate:  [70-88] 77 (06/10 0410) Resp:  [19-24] 19 (06/10 0410) BP: (90-133)/(40-61) 116/54 (06/10 0410) SpO2:  [93 %-99 %] 96 % (06/10 0410) Last BM Date: 11/11/16 Filed Weights   11/10/16 1435 11/10/16 2027 11/11/16 0501  Weight: 56.7 kg (125 lb) 55.7 kg (122 lb 12.8 oz) 55.7 kg (122 lb 12.8 oz)   General: jaundiced but besides this lookd well and younger than 75 by at [place] a decade.  Alert, comfortable .  Heart: irreg, irreg,  No mrg Chest: clear bil.  No labored breathing.   Abdomen: soft, NT, ND.  No mass or HSM.  Active BS  Extremities: no CCE Neuro/Psych:  Alert, oriented x 3.  No tremor or gross weakness.    Intake/Output from previous day: 06/09 0701 - 06/10 0700 In: 1118 [P.O.:240; I.V.:878] Out: 2 [Blood:2]  Intake/Output this shift: No intake/output data recorded.  Lab Results:  Recent Labs  11/10/16 1529 11/11/16 0452 11/12/16 0403  WBC 16.4* 11.7* 11.4*  HGB 13.3 11.3* 9.8*  HCT 37.9 33.1* 29.8*  PLT 291 271 279   BMET  Recent Labs  11/10/16 1529 11/11/16 0452  NA 130* 133*  K 4.8 4.2  CL 96* 99*  CO2 24 23  GLUCOSE 129* 101*  BUN 16 12  CREATININE 1.35* 1.27*  CALCIUM 8.7* 8.3*   LFT  Recent Labs  11/10/16 1529 11/10/16 2037 11/11/16 0452  PROT 7.5  --  6.3*  ALBUMIN 2.7*  --  2.2*  AST 169*  --  136*  ALT 147*  --  117*  ALKPHOS 843*  --  722*  BILITOT 20.5* 19.6* 18.8*  BILIDIR  --  12.1*  --   IBILI  --  7.5*  --    PT/INR  Recent Labs  11/10/16 2037  LABPROT  18.2*  INR 1.49   Hepatitis Panel No results for input(s): HEPBSAG, HCVAB, HEPAIGM, HEPBIGM in the last 72 hours.  Studies/Results: Mr Abdomen Mrcp Wo Contrast  Result Date: 11/11/2016 CLINICAL DATA:  Inpatient. Jaundice and nausea. Cholelithiasis, dilated CBD and choledocholithiasis on abdominal sonogram from earlier today. Cholangitis. EXAM: MRI ABDOMEN WITHOUT CONTRAST  (INCLUDING MRCP) TECHNIQUE: Multiplanar multisequence MR imaging of the abdomen was performed. Heavily T2-weighted images of the biliary and pancreatic ducts were obtained, and three-dimensional MRCP images were rendered by post processing. COMPARISON:  11/11/2016 right upper quadrant abdominal sonogram. FINDINGS: Significantly motion degraded scan. Lower chest: Grossly clear lung bases. Hepatobiliary: Normal liver size and configuration. No hepatic steatosis. There are two similar T2 hyperintense T1 hypointense medial segment left liver lobe lesions measuring 1.3 cm (series 6/ image 12) and 1.1 cm (series 6/image 8). No additional liver lesions. Multiple layering gallstones in the distended gallbladder measuring up to 8 mm in diameter. No gallbladder wall thickening or pericholecystic fluid. Moderate diffuse intrahepatic biliary ductal dilatation. Prominently dilated common bile duct (26 mm diameter). The MRCP sequence is degraded  by motion and nondiagnostic. There is a 1.5 x 1.1 cm filling defect at the junction of the common bile duct and ampulla (series 5/image 28) with intermediate T2 signal intensity. No additional filling defects in the biliary tree. Pancreas: Mild diffuse pancreatic duct dilation up to 5 mm diameter. There are a few small cystic pancreatic lesions in the pancreatic body and tail, largest 1.1 x 0.7 cm in the pancreatic body (series 5/image 15), without appreciable wall thickening or internal nodularity. No evidence of pancreas divisum. Spleen: Normal size. No mass. Adrenals/Urinary Tract: Normal adrenals. No  hydronephrosis. Subcentimeter T2 hyperintense anterior interpolar right renal cortical lesion, presumably a simple renal cysts. No additional renal lesions. Stomach/Bowel: Large hiatal hernia. Stomach is collapsed and otherwise grossly normal. Visualized small and large bowel is normal caliber, with no bowel wall thickening. Vascular/Lymphatic: Nonaneurysmal abdominal aorta. No pathologically enlarged lymph nodes in the abdomen. Other: No abdominal ascites or focal fluid collection. Musculoskeletal: No aggressive appearing focal osseous lesions. IMPRESSION: 1. Limited significantly motion degraded noncontrast MRI/MRCP. 2. Cholelithiasis. Distended gallbladder. No gallbladder wall thickening or pericholecystic fluid to suggest acute cholecystitis. 3. Moderate diffuse intrahepatic biliary ductal dilatation. Prominently dilated common bile duct (26 mm diameter). Solitary 1.5 x 1.1 cm intermediate signal intensity filling defect at the junction of the CBD and ampulla, which is indeterminate for a stone or mass lesion. Correlation with ERCP advised. 4. Mild diffuse pancreatic duct dilation. Scattered small cystic pancreatic lesions in the pancreatic body and tail, largest 1.1 cm, without overt high risk features by noncontrast MRI. Follow-up MRI abdomen without and with IV contrast recommended in 2 years. This recommendation follows ACR consensus guidelines: Management of Incidental Pancreatic Cysts: A White Paper of the ACR Incidental Findings Committee. J Am Coll Radiol 7253;66:440-347. 5. Two T2 hyperintense medial segment left liver lobe lesions, largest 1.3 cm, incompletely characterized, most likely benign cysts or hemangiomas. Outpatient follow-up MRI abdomen without and with IV contrast or follow-up CT abdomen with IV contrast recommended in 3-6 months. 6. Large hiatal hernia. Electronically Signed   By: Ilona Sorrel M.D.   On: 11/11/2016 08:04   Mr 3d Recon At Scanner  Result Date: 11/11/2016 CLINICAL DATA:   Inpatient. Jaundice and nausea. Cholelithiasis, dilated CBD and choledocholithiasis on abdominal sonogram from earlier today. Cholangitis. EXAM: MRI ABDOMEN WITHOUT CONTRAST  (INCLUDING MRCP) TECHNIQUE: Multiplanar multisequence MR imaging of the abdomen was performed. Heavily T2-weighted images of the biliary and pancreatic ducts were obtained, and three-dimensional MRCP images were rendered by post processing. COMPARISON:  11/11/2016 right upper quadrant abdominal sonogram. FINDINGS: Significantly motion degraded scan. Lower chest: Grossly clear lung bases. Hepatobiliary: Normal liver size and configuration. No hepatic steatosis. There are two similar T2 hyperintense T1 hypointense medial segment left liver lobe lesions measuring 1.3 cm (series 6/ image 12) and 1.1 cm (series 6/image 8). No additional liver lesions. Multiple layering gallstones in the distended gallbladder measuring up to 8 mm in diameter. No gallbladder wall thickening or pericholecystic fluid. Moderate diffuse intrahepatic biliary ductal dilatation. Prominently dilated common bile duct (26 mm diameter). The MRCP sequence is degraded by motion and nondiagnostic. There is a 1.5 x 1.1 cm filling defect at the junction of the common bile duct and ampulla (series 5/image 28) with intermediate T2 signal intensity. No additional filling defects in the biliary tree. Pancreas: Mild diffuse pancreatic duct dilation up to 5 mm diameter. There are a few small cystic pancreatic lesions in the pancreatic body and tail, largest 1.1 x 0.7 cm in  the pancreatic body (series 5/image 15), without appreciable wall thickening or internal nodularity. No evidence of pancreas divisum. Spleen: Normal size. No mass. Adrenals/Urinary Tract: Normal adrenals. No hydronephrosis. Subcentimeter T2 hyperintense anterior interpolar right renal cortical lesion, presumably a simple renal cysts. No additional renal lesions. Stomach/Bowel: Large hiatal hernia. Stomach is collapsed  and otherwise grossly normal. Visualized small and large bowel is normal caliber, with no bowel wall thickening. Vascular/Lymphatic: Nonaneurysmal abdominal aorta. No pathologically enlarged lymph nodes in the abdomen. Other: No abdominal ascites or focal fluid collection. Musculoskeletal: No aggressive appearing focal osseous lesions. IMPRESSION: 1. Limited significantly motion degraded noncontrast MRI/MRCP. 2. Cholelithiasis. Distended gallbladder. No gallbladder wall thickening or pericholecystic fluid to suggest acute cholecystitis. 3. Moderate diffuse intrahepatic biliary ductal dilatation. Prominently dilated common bile duct (26 mm diameter). Solitary 1.5 x 1.1 cm intermediate signal intensity filling defect at the junction of the CBD and ampulla, which is indeterminate for a stone or mass lesion. Correlation with ERCP advised. 4. Mild diffuse pancreatic duct dilation. Scattered small cystic pancreatic lesions in the pancreatic body and tail, largest 1.1 cm, without overt high risk features by noncontrast MRI. Follow-up MRI abdomen without and with IV contrast recommended in 2 years. This recommendation follows ACR consensus guidelines: Management of Incidental Pancreatic Cysts: A White Paper of the ACR Incidental Findings Committee. J Am Coll Radiol 3299;24:268-341. 5. Two T2 hyperintense medial segment left liver lobe lesions, largest 1.3 cm, incompletely characterized, most likely benign cysts or hemangiomas. Outpatient follow-up MRI abdomen without and with IV contrast or follow-up CT abdomen with IV contrast recommended in 3-6 months. 6. Large hiatal hernia. Electronically Signed   By: Ilona Sorrel M.D.   On: 11/11/2016 08:04   Dg Chest Port 1 View  Result Date: 11/10/2016 CLINICAL DATA:  Weakness.  Jaundice EXAM: PORTABLE CHEST 1 VIEW COMPARISON:  09/25/2013 FINDINGS: Chronic cardiomegaly and large hiatal hernia. Accounting for artifact there is no convincing consolidation. No edema, effusion, or  pneumothorax. Remote and healed proximal left humerus fracture. IMPRESSION: 1. No acute finding. 2. Chronic cardiomegaly and large hiatal hernia. Electronically Signed   By: Monte Fantasia M.D.   On: 11/10/2016 16:09   Dg Ercp Biliary & Pancreatic Ducts  Result Date: 11/11/2016 CLINICAL DATA:  ERCP with biliary sweeping, sphincterotomy and stent placement. EXAM: ERCP TECHNIQUE: Multiple spot images obtained with the fluoroscopic device and submitted for interpretation post-procedure. COMPARISON:  MRCP -11/11/2016 FINDINGS: 3 spot intraoperative fluoroscopic images of the right upper abdominal quadrant during ERCP are provided for review. Initial image demonstrates an ERCP probe overlying the right upper abdominal quadrant. Subsequent images demonstrate selective cannulation opacification the distal aspect of the common bile duct which appears markedly dilated. Completion image demonstrates placement of a plastic biliary stent overlying the distal aspect of the CBD with persistent tapering of the distal aspect of the CBD. IMPRESSION: ERCP with biliary stent placement as above. These images were submitted for radiologic interpretation only. Please see the procedural report for the amount of contrast and the fluoroscopy time utilized. Electronically Signed   By: Sandi Mariscal M.D.   On: 11/11/2016 13:19   US Abdomen Limited Ruq  Result Date: 11/11/2016 CLINICAL DATA:  Elevated alkaline phosphatase and bilirubin. Previous CBD stone removed. EXAM: ULTRASOUND ABDOMEN LIMITED RIGHT UPPER QUADRANT COMPARISON:  None. FINDINGS: Gallbladder: Layering gallstones are noted without sonographic Murphy's sign. The largest measures 1.1 cm. The gallbladder wall is top normal in thickness at 3 mm. Common bile duct: Diameter: Dilated common bile duct up  to a 2.7 cm with 1.9 cm distal CBD intraluminal focus suspicious for choledocholithiasis. Liver: Intrahepatic ductal dilatation is identified. No space-occupying mass of the liver  is noted. Slightly coarsened heterogeneous appearance of the liver parenchyma with calcified granulomata. IMPRESSION: 1. 1.9 cm distal intraluminal focus within the distal common bile duct with intra and extrahepatic ductal dilatation consistent with choledocholithiasis. 2. Uncomplicated cholelithiasis. 3. Calcified granulomas of the liver. Electronically Signed   By: Ashley Royalty M.D.   On: 11/11/2016 01:46   Scheduled Meds: . diltiazem  120 mg Oral Daily  . levothyroxine  100 mcg Oral QAC breakfast  . metoprolol tartrate  25 mg Oral BID  . pantoprazole  40 mg Oral Daily  . sodium chloride flush  3 mL Intravenous Q12H   Continuous Infusions: . sodium chloride 50 mL/hr at 11/12/16 0832  . piperacillin-tazobactam (ZOSYN)  IV 3.375 g (11/12/16 0504)   PRN Meds:.   ASSESMENT:   *  Jaundice, elevated WBC count: cholangitis and recurrent choledocholithiasis.  S/p 03/2016 ERCP for choledocholithiasis with sphinct and stent placement after unable to extract large stone (per Dr Laural Golden).   S/p 05/2016 stent removal, sphinct extension, stone extraction (per Dr Laural Golden). 11/11/16 MRCP: completed but no report.  11/11/16 ultrasound: choledocholithiasis, cholecystitis, liver granulomas.  ERCP 11/11/16, Dr Therisa Doyne: large HH.  Extension of previous sphincterotomy, pus on ductal sweep.  Unable to retrieve large stone.  Small 5 cm/7 french plastic stent placed after unsuccesful attempt to place larger stent.  Spylglass/lithotripsy recommended.  Did not get Indocin PR post procedure.  Day 3 Zosyn.   T bili, Alk phos and AST/ALT all improved though still signif elevated.  WBCs improved.     *  Chronic Eliquis for a fib, on hold. Heparin gtt stopped at 2200 6/8, not restarted.   *  AKI.   *  Normocytic anemia.    PLAN   *  CMET, CBC in AM.  Continue Zosyn.    *  Do not resume Eliquis until  Decision made regarding lithotripsy or spyglass.  Dr Carlean Purl will assume biliary/GI service tomorrow.     Azucena Freed  11/12/2016, 10:26 AM Pager: [phone]   Attending physician's note   I have taken an interval history, reviewed the chart and examined the patient. I agree with the Advanced Practitioner's note, impression and recommendations.  LFT not collected this AM, I ordered them to be collected later this AM No abd pain WBC trending down, vitals stable Hgb lower at 9.8 from 11.3, continue to monitor Unable to retrieve large CBD stone, 5cm X7 Fr stent was left in place If LFT improving can wait for repeat ERCP  Patient will need to be evaluated for Cholecystectomy to prevent recurrent episode Please consult surgery Continue to hold Eliquis Continue Zosyn   K Denzil Magnuson, MD [phone] Mon-Fri 8a-5p [phone] after 5p, weekends, holidays

## 2016-11-13 LAB — COMPREHENSIVE METABOLIC PANEL
ALK PHOS: 527 U/L — AB (ref 38–126)
ALT: 76 U/L — ABNORMAL HIGH (ref 14–54)
AST: 62 U/L — ABNORMAL HIGH (ref 15–41)
Albumin: 2.1 g/dL — ABNORMAL LOW (ref 3.5–5.0)
Anion gap: 9 (ref 5–15)
BUN: 12 mg/dL (ref 6–20)
CALCIUM: 8.1 mg/dL — AB (ref 8.9–10.3)
CHLORIDE: 106 mmol/L (ref 101–111)
CO2: 21 mmol/L — AB (ref 22–32)
CREATININE: 1.49 mg/dL — AB (ref 0.44–1.00)
GFR calc non Af Amer: 29 mL/min — ABNORMAL LOW (ref >60)
GFR, EST AFRICAN AMERICAN: 34 mL/min — AB (ref >60)
Glucose, Bld: 99 mg/dL (ref 65–99)
Potassium: 3.7 mmol/L (ref 3.5–5.1)
SODIUM: 136 mmol/L (ref 135–145)
Total Bilirubin: 9 mg/dL — ABNORMAL HIGH (ref 0.3–1.2)
Total Protein: 6.3 g/dL — ABNORMAL LOW (ref 6.5–8.1)

## 2016-11-13 LAB — CBC
HCT: 32.4 % — ABNORMAL LOW (ref 36.0–46.0)
HEMOGLOBIN: 10.7 g/dL — AB (ref 12.0–15.0)
MCH: 28.9 pg (ref 26.0–34.0)
MCHC: 33 g/dL (ref 30.0–36.0)
MCV: 87.6 fL (ref 78.0–100.0)
PLATELETS: 333 10*3/uL (ref 150–400)
RBC: 3.7 MIL/uL — AB (ref 3.87–5.11)
RDW: 15.9 % — ABNORMAL HIGH (ref 11.5–15.5)
WBC: 9.2 10*3/uL (ref 4.0–10.5)

## 2016-11-13 MED ORDER — AMOXICILLIN-POT CLAVULANATE 500-125 MG PO TABS: 1.0000 | ORAL_TABLET | Freq: Two times a day (BID) | ORAL | 0 refills | Status: AC

## 2016-11-13 MED ORDER — DILTIAZEM HCL 60 MG PO TABS: 30.0000 mg | ORAL_TABLET | Freq: Four times a day (QID) | ORAL | Status: DC

## 2016-11-13 MED ORDER — APIXABAN 2.5 MG PO TABS: 2.5000 mg | ORAL_TABLET | Freq: Two times a day (BID) | ORAL | 6 refills | Status: DC

## 2016-11-13 MED ORDER — DILTIAZEM HCL ER COATED BEADS 120 MG PO CP24: 120.0000 mg | ORAL_CAPSULE | Freq: Every day | ORAL | Status: DC

## 2016-11-13 MED ORDER — AMOXICILLIN-POT CLAVULANATE 875-125 MG PO TABS: 1.0000 | ORAL_TABLET | Freq: Two times a day (BID) | ORAL | 0 refills | Status: DC

## 2016-11-13 NOTE — Progress Notes (Signed)
PT Cancellation Note  Patient Details Name: Regina [name] MRN: [id number] DOB: 21-May-1923   Cancelled Treatment:    Reason Eval/Treat Not Completed: (P) Patient declined, no reason specified (Pt reports she feels confident in her abilities to negotiate her stairs at home.  PTA edcuated on mobility and safety but remains to decline.  Pt anxious to get dressed and d/c home.  )   Cristela Blue 11/13/2016, 12:20 PM Governor Rooks, PTA pager [phone]

## 2016-11-13 NOTE — Progress Notes (Signed)
Pharmacist team recommended augmentin 500-125 rather than 800-125 due to patient's age and weight. Made change and called [facility] pharmacy to confirm change.

## 2016-11-13 NOTE — Consult Note (Signed)
           [facility] CM Primary Care Navigator  11/13/2016  Regina [name] 06-04-1923 [id number]   Went to see patientat the bedsideto identify possible discharge needs but she was alreadydischarged per staff report.  Patient was discharged home with home health services.  Primary care provider's office called Vicente Males at Dr. Knute Neu covering for Dr. Burnadette Pop notify of patient's discharge and need for post hospital follow-up and transition of care. Reminded also of patient's health issues needing follow-up. Made aware to refer patient to [facility] care management ifdeemed necessaryfor services.    For questions, please contact:  Dannielle Huh, BSN, RN- [facility] Primary Care Navigator  Telephone: [phone] [place]

## 2016-11-13 NOTE — Progress Notes (Signed)
Family Medicine Teaching Service Daily Progress Note Intern Pager: [phone]  Patient name: Regina [name] Medical record number: [id number] Date of birth: 01/19/1923 Age: 83 y.o. Gender: female  Primary Care Provider: Sinda Du, MD Consultants: GI Code Status: Full  Pt Overview and Major Events to Date:  6/8- admitted to [facility] with jaundice 6/9- attempted ERCP unsuccessful  Assessment and Plan: Regina [name] is a 83 y.o. female with a past medical history significant for atrial fibrillation, hypothyroidism, choledocholithiasis s/p ERCP (Oct 2017), AVN left hip s/p replacement, hypertension, hiatal hernia and stroke who presented with generalized jaundice in the the setting of elevated liver labs concerning for cholestasis.  #Jaundice and transaminitis, acute- secondary to choledocholithiasis. Jaundice improving. Bili improved from 19 > 9 today.  -stop Zosyn today -GI following, appreciate recs -patient will need cholecystectomy at some point, resistant to doing this while inpatient would prefer to discuss with her GI doctor as outpatient -Zofran 4 mg prn  -Tramadol 50 mg prn   #Acute Kidney Injury- Improving, Trend 1.35 > 1.27 > 1.49 > 1.49. Likely pre-renal in nature due to poor PO intake prior to hospitalization. Baseline is (0.8-0.9).  -d/c IVF as patient is taking good PO  #Urinary tract infection, acute- was dx outpatient 3-4 days prior to admission and started on amoxicillin. Without symptoms.  - d/c zosyn - urine culture contaminated  #AMS, Fatigue and weakness, acute, stable - improved, likely secondary to failing liver with a component of encephalopathy. - Monitor mental status - PT/OT consulted, recommend HH PT/OT  #Anemia - Stable. Hgb on admission 13.3 > 11.3 > 9.8 > 10.7, no signs of active bleeding -continue to monitor -trend CBC -transfusion threshold < 8 -FOBT ordered  #Hyponatremia, acute - resolved. Trend 130 > 133 > 135 > 136. -monitor as  outpatient  #Atrial fibrillation, chronic, stable- rate controlled, on eliquis outpatient for anticoagulation --Continue diltiazem 120 mg daily  --Continue metoprolol 25 mg bid --anticoagulation held per GI, patient possibly needs surgery this admission  #Hypertension- stable --Will continue to monitor --Continue Metoprolol 25 mg   #Hypothyroidism- stable --Continue levothyroxine 100 mcg daily  #GERD --Continue Protonix 40 mg daily  FEN/GI: regular diet Prophylaxis: held for possible procedure  Disposition: pending GI recommendations, possibly home today  Subjective:  Regina [name] states she is doing well. Daughter at bedside, upset with care. Regina [name] denies pain, nausea. Eating breakfast.   Objective: Temp:  [97.5 F (36.4 C)-98.4 F (36.9 C)] 98.3 F (36.8 C) (06/11 0622) Pulse Rate:  [76-89] 78 (06/11 0622) Resp:  [17-19] 17 (06/11 0622) BP: (97-146)/(56-70) 144/70 (06/11 0806) SpO2:  [96 %-100 %] 96 % (06/11 0622) Weight:  [133 lb 8 oz (60.6 kg)] 133 lb 8 oz (60.6 kg) (06/11 0500) Physical Exam: General: jaundiced elderly lady in NAD Cardiovascular: irregular rhythm, regular rate Respiratory: CTAB, no increased WOB Abdomen: soft NTND +BS Extremities: no edema or cyanosis  Laboratory:  Recent Labs Lab 11/11/16 0452 11/12/16 0403 11/13/16 0205  WBC 11.7* 11.4* 9.2  HGB 11.3* 9.8* 10.7*  HCT 33.1* 29.8* 32.4*  PLT 271 279 333    Recent Labs Lab 11/11/16 0452 11/12/16 1125 11/13/16 0205  NA 133* 135 136  K 4.2 3.9 3.7  CL 99* 106 106  CO2 23 21* 21*  BUN 12 14 12   CREATININE 1.27* 1.49* 1.49*  CALCIUM 8.3* 8.0* 8.1*  PROT 6.3* 6.3* 6.3*  BILITOT 18.8* 11.0* 9.0*  ALKPHOS 722* 530* 527*  ALT 117* 79* 76*  AST  136* 73* 62*  GLUCOSE 101* 135* 99   Direct Bili 12.1, Indirect 7.5 T Bili 19.6 GGT 985 INR 1.49 PT 18.2 PTT 38 TSH 2.308  Imaging/Diagnostic Tests: Dg Ercp Biliary & Pancreatic Ducts  Result Date: 11/11/2016 IMPRESSION:  ERCP with biliary stent placement as above. These images were submitted for radiologic interpretation only. Please see the procedural report for the amount of contrast and the fluoroscopy time utilized. Electronically Signed   By: Sandi Mariscal M.D.   On: 11/11/2016 13:19   US Abdomen Limited Ruq  Result Date: 11/11/2016  IMPRESSION: 1. 1.9 cm distal intraluminal focus within the distal common bile duct with intra and extrahepatic ductal dilatation consistent with choledocholithiasis. 2. Uncomplicated cholelithiasis. 3. Calcified granulomas of the liver. Electronically Signed   By: Ashley Royalty M.D.   On: 11/11/2016 01:46   Steve Rattler, DO 11/13/2016, 9:03 AM PGY-1, [place] Intern pager: [phone], text pages welcome

## 2016-11-13 NOTE — Progress Notes (Signed)
Regina [name] to be D/C'd  per MD order. Discussed with the patient and all questions fully answered.  VSS, Skin clean, dry and intact without evidence of skin break down, no evidence of skin tears noted.  IV catheter discontinued intact. Site without signs and symptoms of complications. Dressing and pressure applied.  An After Visit Summary was printed and given to the patient. Patient received prescription.  D/c education completed with patient/family including follow up instructions, medication list, d/c activities limitations if indicated, with other d/c instructions as indicated by MD - patient able to verbalize understanding, all questions fully answered.   Patient instructed to return to ED, call 911, or call MD for any changes in condition.   Patient to be escorted via [place], and D/C home via private auto.

## 2016-11-13 NOTE — Care Management Note (Addendum)
Case Management Note  Patient Details  Name: Regina [name] MRN: [id number] Date of Birth: 07-13-1922  Subjective/Objective:                    Action/Plan: Patient discharging home with [facility] services. CM met with the patient and her son and provided a list of [facility] agencies. She selected [place]. Santiago Glad with [facility] informed and accepted the referral. Santiago Glad also made aware of need for 3 in 1. She will have equipment delivered to the room. Family to provide transportation home and 24/7 supervision.   Expected Discharge Date:  11/13/16               Expected Discharge Plan:  [place]  In-House Referral:     Discharge planning Services  CM Consult  Post Acute Care Choice:  Durable Medical Equipment, Home Health Choice offered to:  Patient, Adult Children  DME Arranged:  3-N-1 DME Agency:  [place] Arranged:  PT, OT [place] Agency:  [place]  Status of Service:  Completed, signed off  If discussed at [place] of Stay Meetings, dates discussed:    Additional Comments:  Pollie Friar, RN 11/13/2016, 11:32 AM

## 2016-11-13 NOTE — Progress Notes (Signed)
Paged intern about patient's cardizem being changed to 3x/day versus once in morning. I let them know I had already given her the morning dose before they changed the order, so they said don't give anymore doses today.

## 2016-11-15 ENCOUNTER — Encounter (HOSPITAL_COMMUNITY): Payer: Self-pay | Admitting: Gastroenterology

## 2016-11-15 DIAGNOSIS — M1990 Unspecified osteoarthritis, unspecified site: Secondary | ICD-10-CM | POA: Diagnosis not present

## 2016-11-15 DIAGNOSIS — D649 Anemia, unspecified: Secondary | ICD-10-CM | POA: Diagnosis not present

## 2016-11-15 DIAGNOSIS — I482 Chronic atrial fibrillation: Secondary | ICD-10-CM | POA: Diagnosis not present

## 2016-11-15 DIAGNOSIS — Z7901 Long term (current) use of anticoagulants: Secondary | ICD-10-CM | POA: Diagnosis not present

## 2016-11-15 DIAGNOSIS — K449 Diaphragmatic hernia without obstruction or gangrene: Secondary | ICD-10-CM | POA: Diagnosis not present

## 2016-11-15 DIAGNOSIS — Z8673 Personal history of transient ischemic attack (TIA), and cerebral infarction without residual deficits: Secondary | ICD-10-CM | POA: Diagnosis not present

## 2016-11-15 DIAGNOSIS — I10 Essential (primary) hypertension: Secondary | ICD-10-CM | POA: Diagnosis not present

## 2016-11-15 DIAGNOSIS — K8030 Calculus of bile duct with cholangitis, unspecified, without obstruction: Secondary | ICD-10-CM | POA: Diagnosis not present

## 2016-11-15 DIAGNOSIS — R531 Weakness: Secondary | ICD-10-CM | POA: Diagnosis not present

## 2016-11-15 DIAGNOSIS — K219 Gastro-esophageal reflux disease without esophagitis: Secondary | ICD-10-CM | POA: Diagnosis not present

## 2016-11-15 DIAGNOSIS — Z96642 Presence of left artificial hip joint: Secondary | ICD-10-CM | POA: Diagnosis not present

## 2016-11-15 DIAGNOSIS — N390 Urinary tract infection, site not specified: Secondary | ICD-10-CM | POA: Diagnosis not present

## 2016-11-16 ENCOUNTER — Telehealth (INDEPENDENT_AMBULATORY_CARE_PROVIDER_SITE_OTHER): Payer: Self-pay | Admitting: Internal Medicine

## 2016-11-16 NOTE — Telephone Encounter (Signed)
Forwarded to Dr.Rehman to address. 

## 2016-11-16 NOTE — Telephone Encounter (Signed)
The patient's daughter-in-law, Unika Nazareno called and stated that she wanted to make sure that Dr. Laural Golden can see everything that was done at [facility] this weekend.  I told her that he could see the information in Epic.  She wants to know when she would need to follow up.  She is still very weak.   [phone]

## 2016-11-17 DIAGNOSIS — R531 Weakness: Secondary | ICD-10-CM | POA: Diagnosis not present

## 2016-11-17 DIAGNOSIS — K8030 Calculus of bile duct with cholangitis, unspecified, without obstruction: Secondary | ICD-10-CM | POA: Diagnosis not present

## 2016-11-17 DIAGNOSIS — D649 Anemia, unspecified: Secondary | ICD-10-CM | POA: Diagnosis not present

## 2016-11-17 DIAGNOSIS — I482 Chronic atrial fibrillation: Secondary | ICD-10-CM | POA: Diagnosis not present

## 2016-11-17 DIAGNOSIS — I10 Essential (primary) hypertension: Secondary | ICD-10-CM | POA: Diagnosis not present

## 2016-11-17 DIAGNOSIS — N390 Urinary tract infection, site not specified: Secondary | ICD-10-CM | POA: Diagnosis not present

## 2016-11-20 DIAGNOSIS — D649 Anemia, unspecified: Secondary | ICD-10-CM | POA: Diagnosis not present

## 2016-11-20 DIAGNOSIS — N390 Urinary tract infection, site not specified: Secondary | ICD-10-CM | POA: Diagnosis not present

## 2016-11-20 DIAGNOSIS — I482 Chronic atrial fibrillation: Secondary | ICD-10-CM | POA: Diagnosis not present

## 2016-11-20 DIAGNOSIS — I10 Essential (primary) hypertension: Secondary | ICD-10-CM | POA: Diagnosis not present

## 2016-11-20 DIAGNOSIS — R531 Weakness: Secondary | ICD-10-CM | POA: Diagnosis not present

## 2016-11-20 DIAGNOSIS — K8030 Calculus of bile duct with cholangitis, unspecified, without obstruction: Secondary | ICD-10-CM | POA: Diagnosis not present

## 2016-11-20 NOTE — Telephone Encounter (Signed)
Talk with patient's daughter-in-law Benjamine Mola. Patient has single large stone in CBD resulting in biliary obstruction. She is doing better with biliary stenting but she had on 11/11/2016 at [facility]. She will call if patient develops fever or abdominal pain otherwise will see patient in the office on 12/14/2016. Patient will have LFTs 2 days prior to that visit.

## 2016-11-21 ENCOUNTER — Other Ambulatory Visit (INDEPENDENT_AMBULATORY_CARE_PROVIDER_SITE_OTHER): Payer: Self-pay | Admitting: *Deleted

## 2016-11-21 DIAGNOSIS — I482 Chronic atrial fibrillation: Secondary | ICD-10-CM | POA: Diagnosis not present

## 2016-11-21 DIAGNOSIS — K8050 Calculus of bile duct without cholangitis or cholecystitis without obstruction: Secondary | ICD-10-CM

## 2016-11-21 DIAGNOSIS — I10 Essential (primary) hypertension: Secondary | ICD-10-CM | POA: Diagnosis not present

## 2016-11-21 DIAGNOSIS — R531 Weakness: Secondary | ICD-10-CM | POA: Diagnosis not present

## 2016-11-21 DIAGNOSIS — D649 Anemia, unspecified: Secondary | ICD-10-CM | POA: Diagnosis not present

## 2016-11-21 DIAGNOSIS — K8030 Calculus of bile duct with cholangitis, unspecified, without obstruction: Secondary | ICD-10-CM | POA: Diagnosis not present

## 2016-11-21 DIAGNOSIS — N390 Urinary tract infection, site not specified: Secondary | ICD-10-CM | POA: Diagnosis not present

## 2016-11-21 NOTE — Telephone Encounter (Signed)
I have sent [name] a staff message to determine what time to schedule this patient for 12/14/16.  Once I know, I will call the patient and her daughter.

## 2016-11-21 NOTE — Telephone Encounter (Signed)
LFTS' are noted for 07/10/218 , 2 days prior to OV 12/14/2016.

## 2016-11-22 DIAGNOSIS — K8030 Calculus of bile duct with cholangitis, unspecified, without obstruction: Secondary | ICD-10-CM | POA: Diagnosis not present

## 2016-11-22 DIAGNOSIS — I10 Essential (primary) hypertension: Secondary | ICD-10-CM | POA: Diagnosis not present

## 2016-11-22 DIAGNOSIS — D649 Anemia, unspecified: Secondary | ICD-10-CM | POA: Diagnosis not present

## 2016-11-22 DIAGNOSIS — R531 Weakness: Secondary | ICD-10-CM | POA: Diagnosis not present

## 2016-11-22 DIAGNOSIS — N390 Urinary tract infection, site not specified: Secondary | ICD-10-CM | POA: Diagnosis not present

## 2016-11-22 DIAGNOSIS — I482 Chronic atrial fibrillation: Secondary | ICD-10-CM | POA: Diagnosis not present

## 2016-11-23 DIAGNOSIS — D649 Anemia, unspecified: Secondary | ICD-10-CM | POA: Diagnosis not present

## 2016-11-23 DIAGNOSIS — I10 Essential (primary) hypertension: Secondary | ICD-10-CM | POA: Diagnosis not present

## 2016-11-23 DIAGNOSIS — I482 Chronic atrial fibrillation: Secondary | ICD-10-CM | POA: Diagnosis not present

## 2016-11-23 DIAGNOSIS — N390 Urinary tract infection, site not specified: Secondary | ICD-10-CM | POA: Diagnosis not present

## 2016-11-23 DIAGNOSIS — K8030 Calculus of bile duct with cholangitis, unspecified, without obstruction: Secondary | ICD-10-CM | POA: Diagnosis not present

## 2016-11-23 DIAGNOSIS — R531 Weakness: Secondary | ICD-10-CM | POA: Diagnosis not present

## 2016-11-24 DIAGNOSIS — R531 Weakness: Secondary | ICD-10-CM | POA: Diagnosis not present

## 2016-11-24 DIAGNOSIS — N390 Urinary tract infection, site not specified: Secondary | ICD-10-CM | POA: Diagnosis not present

## 2016-11-24 DIAGNOSIS — D649 Anemia, unspecified: Secondary | ICD-10-CM | POA: Diagnosis not present

## 2016-11-24 DIAGNOSIS — I482 Chronic atrial fibrillation: Secondary | ICD-10-CM | POA: Diagnosis not present

## 2016-11-24 DIAGNOSIS — K8030 Calculus of bile duct with cholangitis, unspecified, without obstruction: Secondary | ICD-10-CM | POA: Diagnosis not present

## 2016-11-24 DIAGNOSIS — I10 Essential (primary) hypertension: Secondary | ICD-10-CM | POA: Diagnosis not present

## 2016-11-27 DIAGNOSIS — R531 Weakness: Secondary | ICD-10-CM | POA: Diagnosis not present

## 2016-11-27 DIAGNOSIS — N390 Urinary tract infection, site not specified: Secondary | ICD-10-CM | POA: Diagnosis not present

## 2016-11-27 DIAGNOSIS — K8030 Calculus of bile duct with cholangitis, unspecified, without obstruction: Secondary | ICD-10-CM | POA: Diagnosis not present

## 2016-11-27 DIAGNOSIS — I6789 Other cerebrovascular disease: Secondary | ICD-10-CM | POA: Diagnosis not present

## 2016-11-27 DIAGNOSIS — D649 Anemia, unspecified: Secondary | ICD-10-CM | POA: Diagnosis not present

## 2016-11-27 DIAGNOSIS — I482 Chronic atrial fibrillation: Secondary | ICD-10-CM | POA: Diagnosis not present

## 2016-11-27 DIAGNOSIS — I10 Essential (primary) hypertension: Secondary | ICD-10-CM | POA: Diagnosis not present

## 2016-11-28 DIAGNOSIS — K8030 Calculus of bile duct with cholangitis, unspecified, without obstruction: Secondary | ICD-10-CM | POA: Diagnosis not present

## 2016-11-28 DIAGNOSIS — D649 Anemia, unspecified: Secondary | ICD-10-CM | POA: Diagnosis not present

## 2016-11-28 DIAGNOSIS — N390 Urinary tract infection, site not specified: Secondary | ICD-10-CM | POA: Diagnosis not present

## 2016-11-28 DIAGNOSIS — R531 Weakness: Secondary | ICD-10-CM | POA: Diagnosis not present

## 2016-11-28 DIAGNOSIS — I10 Essential (primary) hypertension: Secondary | ICD-10-CM | POA: Diagnosis not present

## 2016-11-28 DIAGNOSIS — I482 Chronic atrial fibrillation: Secondary | ICD-10-CM | POA: Diagnosis not present

## 2016-11-29 ENCOUNTER — Other Ambulatory Visit (INDEPENDENT_AMBULATORY_CARE_PROVIDER_SITE_OTHER): Payer: Self-pay | Admitting: *Deleted

## 2016-11-29 ENCOUNTER — Encounter (INDEPENDENT_AMBULATORY_CARE_PROVIDER_SITE_OTHER): Payer: Self-pay | Admitting: *Deleted

## 2016-11-29 DIAGNOSIS — D649 Anemia, unspecified: Secondary | ICD-10-CM | POA: Diagnosis not present

## 2016-11-29 DIAGNOSIS — K8030 Calculus of bile duct with cholangitis, unspecified, without obstruction: Secondary | ICD-10-CM | POA: Diagnosis not present

## 2016-11-29 DIAGNOSIS — I10 Essential (primary) hypertension: Secondary | ICD-10-CM | POA: Diagnosis not present

## 2016-11-29 DIAGNOSIS — K8050 Calculus of bile duct without cholangitis or cholecystitis without obstruction: Secondary | ICD-10-CM

## 2016-11-29 DIAGNOSIS — I482 Chronic atrial fibrillation: Secondary | ICD-10-CM | POA: Diagnosis not present

## 2016-11-29 DIAGNOSIS — R531 Weakness: Secondary | ICD-10-CM | POA: Diagnosis not present

## 2016-11-29 DIAGNOSIS — N390 Urinary tract infection, site not specified: Secondary | ICD-10-CM | POA: Diagnosis not present

## 2016-11-30 DIAGNOSIS — K8030 Calculus of bile duct with cholangitis, unspecified, without obstruction: Secondary | ICD-10-CM | POA: Diagnosis not present

## 2016-11-30 DIAGNOSIS — R531 Weakness: Secondary | ICD-10-CM | POA: Diagnosis not present

## 2016-11-30 DIAGNOSIS — I482 Chronic atrial fibrillation: Secondary | ICD-10-CM | POA: Diagnosis not present

## 2016-11-30 DIAGNOSIS — I10 Essential (primary) hypertension: Secondary | ICD-10-CM | POA: Diagnosis not present

## 2016-11-30 DIAGNOSIS — D649 Anemia, unspecified: Secondary | ICD-10-CM | POA: Diagnosis not present

## 2016-11-30 DIAGNOSIS — N390 Urinary tract infection, site not specified: Secondary | ICD-10-CM | POA: Diagnosis not present

## 2016-12-11 DIAGNOSIS — K8050 Calculus of bile duct without cholangitis or cholecystitis without obstruction: Secondary | ICD-10-CM | POA: Diagnosis not present

## 2016-12-12 LAB — HEPATIC FUNCTION PANEL
ALT: 15 U/L (ref 6–29)
AST: 21 U/L (ref 10–35)
Albumin: 3.8 g/dL (ref 3.6–5.1)
Alkaline Phosphatase: 162 U/L — ABNORMAL HIGH (ref 33–130)
BILIRUBIN DIRECT: 1 mg/dL — AB (ref <=0.2)
BILIRUBIN TOTAL: 2.1 mg/dL — AB (ref 0.2–1.2)
Indirect Bilirubin: 1.1 mg/dL (ref 0.2–1.2)
TOTAL PROTEIN: 7 g/dL (ref 6.1–8.1)

## 2016-12-14 ENCOUNTER — Encounter (INDEPENDENT_AMBULATORY_CARE_PROVIDER_SITE_OTHER): Payer: Self-pay | Admitting: Internal Medicine

## 2016-12-14 ENCOUNTER — Ambulatory Visit (INDEPENDENT_AMBULATORY_CARE_PROVIDER_SITE_OTHER): Payer: Medicare Other | Admitting: Internal Medicine

## 2016-12-14 VITALS — BP 116/70 | HR 65 | Temp 97.1°F | Resp 18 | Ht 64.0 in | Wt 115.8 lb

## 2016-12-14 DIAGNOSIS — K8050 Calculus of bile duct without cholangitis or cholecystitis without obstruction: Secondary | ICD-10-CM | POA: Diagnosis not present

## 2016-12-14 MED ORDER — URSODIOL 250 MG PO TABS: 250.0000 mg | ORAL_TABLET | Freq: Two times a day (BID) | ORAL | 5 refills | Status: DC

## 2016-12-14 NOTE — Patient Instructions (Signed)
Call if you notice change in color of skin or urine or if you have abdominal or chest pain.

## 2016-12-14 NOTE — Progress Notes (Signed)
Presenting complaint;  Follow-up for choledocholithiasis.  Database and Subjective:  Patient is [name] who has history of choledocholithiasis for at least 4 years and decided observation until October last year when she became symptomatic. She underwent ERCP revealing markedly dilated biliary system with multiple stones. Some of the stones were removed. She required mechanical lithotripsy. Biliary stent was placed since all of the stones were not removed. She return for repeat ERCP in December 2017 when stent and remaining stones or removed. She was seen in the office in in March 2018 and was doing well. She developed jaundice and was hospitalized at [facility] on 11/10/2016. Imaging studies reveal single large stone. ERCP was performed with stone could not be removed. Therefore biliary stent was placed. Patient was also advised cholecystectomy.  Patient presents with today with her daughter Jan and daughter-in-law Benjamine Mola. She has not had any symptoms since she had the stent placed. He had lost 12 pounds during this illness. She believes she may have gained a few back. She denies nausea vomiting or abdominal pain. She states color of urine and stool is back to normal.   Current Medications: Outpatient Encounter Prescriptions as of 12/14/2016  Medication Sig  . acetaminophen (TYLENOL) 325 MG tablet Take 325 mg by mouth every 6 (six) hours as needed for headache (pain).  Marland Kitchen apixaban (ELIQUIS) 2.5 MG TABS tablet Take 1 tablet (2.5 mg total) by mouth 2 (two) times daily.  . clotrimazole-betamethasone (LOTRISONE) cream Apply 1 application topically 2 (two) times daily as needed (rash).   Marland Kitchen diltiazem (CARDIZEM CD) 120 MG 24 hr capsule take 1 capsule by mouth once daily  . levothyroxine (SYNTHROID, LEVOTHROID) 100 MCG tablet Take 100 mcg by mouth daily.   . metoprolol tartrate (LOPRESSOR) 25 MG tablet take 1 tablet by mouth twice a day  . metoprolol tartrate (LOPRESSOR) 25 MG tablet  take 1 tablet twice a day  . pantoprazole (PROTONIX) 40 MG tablet Take 40 mg by mouth daily.   No facility-administered encounter medications on file as of 12/14/2016.      Objective: Blood pressure 116/70, pulse 65, temperature (!) 97.1 F (36.2 C), temperature source Oral, resp. rate 18, height 5\' 4"  (1.626 m), weight 115 lb 12.8 oz (52.5 kg). Patient is alert and in no acute distress. Conjunctiva is pink. Sclera is nonicteric Oropharyngeal mucosa is normal. No neck masses or thyromegaly noted. Cardiac exam with irregular rhythm normal S1 and S2. No murmur or gallop noted. Lungs are clear to auscultation. Abdomen is symmetrical soft and nontender without organomegaly or masses. No LE edema or clubbing noted.  Labs/studies Results: LFTs from 12/11/2016  Bilirubin 2.1 direct 1.0 indirect 1.1 AP 162, AST 21, ALT 15, total protein 7 and albumin 3.8.    Assessment:  #1. Recurrent choledocholithiasis. Suspect retained stone rather than true recurrence. Stone could not be crossed with mechanical lithotripsy. Therefore she may need holmium laser lithotripsy. She is presently asymptomatic. Options include repeat procedure at [facility] on referral to [facility]. Since she is asymptomatic she wants to wait and think about it. However if she develops signs and symptoms of biliary obstruction she will need ERCP on urgent basis. In the meantime will start on Urso which hopefully will prolong stent patency and slow down arm a more stones.  Plan:  Patient advised to call office if she develops chest abdominal pain nausea vomiting fever or jaundice. She will return for office visit in 8 weeks unless she chooses to proceed with ERCP  either locally or at tertiary center.

## 2016-12-25 ENCOUNTER — Other Ambulatory Visit: Payer: Self-pay | Admitting: Cardiovascular Disease

## 2016-12-29 ENCOUNTER — Inpatient Hospital Stay (HOSPITAL_COMMUNITY): Payer: Medicare Other

## 2016-12-29 ENCOUNTER — Emergency Department (HOSPITAL_COMMUNITY): Payer: Medicare Other

## 2016-12-29 ENCOUNTER — Inpatient Hospital Stay (HOSPITAL_COMMUNITY): Payer: Medicare Other | Admitting: Anesthesiology

## 2016-12-29 ENCOUNTER — Inpatient Hospital Stay (HOSPITAL_COMMUNITY)
Admission: EM | Admit: 2016-12-29 | Discharge: 2016-12-31 | DRG: 919 | Disposition: A | Discharge status: Home / Self Care | Payer: Medicare Other | Attending: Pulmonary Disease | Admitting: Pulmonary Disease

## 2016-12-29 ENCOUNTER — Encounter (HOSPITAL_COMMUNITY)
Admission: EM | Disposition: A | Discharge status: Home / Self Care | Payer: Self-pay | Source: Home / Self Care | Attending: Pulmonary Disease

## 2016-12-29 ENCOUNTER — Telehealth (INDEPENDENT_AMBULATORY_CARE_PROVIDER_SITE_OTHER): Payer: Self-pay | Admitting: *Deleted

## 2016-12-29 ENCOUNTER — Encounter (HOSPITAL_COMMUNITY): Payer: Self-pay | Admitting: Emergency Medicine

## 2016-12-29 ENCOUNTER — Other Ambulatory Visit (INDEPENDENT_AMBULATORY_CARE_PROVIDER_SITE_OTHER): Payer: Self-pay | Admitting: *Deleted

## 2016-12-29 DIAGNOSIS — K831 Obstruction of bile duct: Secondary | ICD-10-CM

## 2016-12-29 DIAGNOSIS — M8788 Other osteonecrosis, other site: Secondary | ICD-10-CM | POA: Diagnosis present

## 2016-12-29 DIAGNOSIS — Y838 Other surgical procedures as the cause of abnormal reaction of the patient, or of later complication, without mention of misadventure at the time of the procedure: Secondary | ICD-10-CM | POA: Diagnosis present

## 2016-12-29 DIAGNOSIS — Z8673 Personal history of transient ischemic attack (TIA), and cerebral infarction without residual deficits: Secondary | ICD-10-CM

## 2016-12-29 DIAGNOSIS — I959 Hypotension, unspecified: Secondary | ICD-10-CM

## 2016-12-29 DIAGNOSIS — R17 Unspecified jaundice: Secondary | ICD-10-CM | POA: Diagnosis present

## 2016-12-29 DIAGNOSIS — R1011 Right upper quadrant pain: Secondary | ICD-10-CM

## 2016-12-29 DIAGNOSIS — A419 Sepsis, unspecified organism: Secondary | ICD-10-CM

## 2016-12-29 DIAGNOSIS — R110 Nausea: Secondary | ICD-10-CM

## 2016-12-29 DIAGNOSIS — K8031 Calculus of bile duct with cholangitis, unspecified, with obstruction: Secondary | ICD-10-CM | POA: Diagnosis not present

## 2016-12-29 DIAGNOSIS — K8051 Calculus of bile duct without cholangitis or cholecystitis with obstruction: Secondary | ICD-10-CM | POA: Diagnosis not present

## 2016-12-29 DIAGNOSIS — R6883 Chills (without fever): Secondary | ICD-10-CM

## 2016-12-29 DIAGNOSIS — R7881 Bacteremia: Secondary | ICD-10-CM | POA: Diagnosis not present

## 2016-12-29 DIAGNOSIS — M1990 Unspecified osteoarthritis, unspecified site: Secondary | ICD-10-CM | POA: Diagnosis present

## 2016-12-29 DIAGNOSIS — I10 Essential (primary) hypertension: Secondary | ICD-10-CM | POA: Diagnosis present

## 2016-12-29 DIAGNOSIS — K449 Diaphragmatic hernia without obstruction or gangrene: Secondary | ICD-10-CM | POA: Diagnosis present

## 2016-12-29 DIAGNOSIS — I4891 Unspecified atrial fibrillation: Secondary | ICD-10-CM | POA: Diagnosis present

## 2016-12-29 DIAGNOSIS — D649 Anemia, unspecified: Secondary | ICD-10-CM | POA: Diagnosis present

## 2016-12-29 DIAGNOSIS — A4189 Other specified sepsis: Secondary | ICD-10-CM | POA: Diagnosis present

## 2016-12-29 DIAGNOSIS — K8020 Calculus of gallbladder without cholecystitis without obstruction: Secondary | ICD-10-CM | POA: Diagnosis not present

## 2016-12-29 DIAGNOSIS — Z79899 Other long term (current) drug therapy: Secondary | ICD-10-CM

## 2016-12-29 DIAGNOSIS — E039 Hypothyroidism, unspecified: Secondary | ICD-10-CM | POA: Diagnosis present

## 2016-12-29 DIAGNOSIS — D72829 Elevated white blood cell count, unspecified: Secondary | ICD-10-CM | POA: Diagnosis not present

## 2016-12-29 DIAGNOSIS — R109 Unspecified abdominal pain: Secondary | ICD-10-CM | POA: Diagnosis not present

## 2016-12-29 DIAGNOSIS — R1084 Generalized abdominal pain: Secondary | ICD-10-CM | POA: Diagnosis not present

## 2016-12-29 DIAGNOSIS — R7989 Other specified abnormal findings of blood chemistry: Secondary | ICD-10-CM

## 2016-12-29 DIAGNOSIS — Z7901 Long term (current) use of anticoagulants: Secondary | ICD-10-CM

## 2016-12-29 DIAGNOSIS — Z96642 Presence of left artificial hip joint: Secondary | ICD-10-CM | POA: Diagnosis present

## 2016-12-29 DIAGNOSIS — I509 Heart failure, unspecified: Secondary | ICD-10-CM | POA: Diagnosis not present

## 2016-12-29 DIAGNOSIS — R740 Nonspecific elevation of levels of transaminase and lactic acid dehydrogenase [LDH]: Secondary | ICD-10-CM | POA: Diagnosis not present

## 2016-12-29 DIAGNOSIS — R748 Abnormal levels of other serum enzymes: Secondary | ICD-10-CM | POA: Diagnosis not present

## 2016-12-29 DIAGNOSIS — K8309 Other cholangitis: Secondary | ICD-10-CM | POA: Diagnosis present

## 2016-12-29 DIAGNOSIS — T85590A Other mechanical complication of bile duct prosthesis, initial encounter: Principal | ICD-10-CM | POA: Diagnosis present

## 2016-12-29 DIAGNOSIS — K830 Cholangitis: Secondary | ICD-10-CM | POA: Diagnosis not present

## 2016-12-29 DIAGNOSIS — R7401 Elevation of levels of liver transaminase levels: Secondary | ICD-10-CM

## 2016-12-29 DIAGNOSIS — R778 Other specified abnormalities of plasma proteins: Secondary | ICD-10-CM | POA: Diagnosis present

## 2016-12-29 DIAGNOSIS — R509 Fever, unspecified: Secondary | ICD-10-CM

## 2016-12-29 DIAGNOSIS — Z4659 Encounter for fitting and adjustment of other gastrointestinal appliance and device: Secondary | ICD-10-CM | POA: Diagnosis not present

## 2016-12-29 HISTORY — PX: ERCP: SHX5425

## 2016-12-29 LAB — CBC WITH DIFFERENTIAL/PLATELET
BASOS ABS: 0 10*3/uL (ref 0.0–0.1)
BASOS PCT: 0 %
EOS PCT: 0 %
Eosinophils Absolute: 0 10*3/uL (ref 0.0–0.7)
HEMATOCRIT: 41.9 % (ref 36.0–46.0)
Hemoglobin: 13.7 g/dL (ref 12.0–15.0)
Lymphocytes Relative: 2 %
Lymphs Abs: 0.4 10*3/uL — ABNORMAL LOW (ref 0.7–4.0)
MCH: 29.7 pg (ref 26.0–34.0)
MCHC: 32.7 g/dL (ref 30.0–36.0)
MCV: 90.7 fL (ref 78.0–100.0)
MONO ABS: 1.5 10*3/uL — AB (ref 0.1–1.0)
Monocytes Relative: 6 %
NEUTROS ABS: 22.1 10*3/uL — AB (ref 1.7–7.7)
Neutrophils Relative %: 92 %
PLATELETS: 159 10*3/uL (ref 150–400)
RBC: 4.62 MIL/uL (ref 3.87–5.11)
RDW: 15.8 % — AB (ref 11.5–15.5)
WBC: 24.1 10*3/uL — ABNORMAL HIGH (ref 4.0–10.5)

## 2016-12-29 LAB — PROTIME-INR
INR: 1.32
PROTHROMBIN TIME: 16.4 s — AB (ref 11.4–15.2)

## 2016-12-29 LAB — URINALYSIS, ROUTINE W REFLEX MICROSCOPIC
Glucose, UA: NEGATIVE mg/dL
Ketones, ur: NEGATIVE mg/dL
Leukocytes, UA: NEGATIVE
NITRITE: NEGATIVE
PH: 5 (ref 5.0–8.0)
Protein, ur: 100 mg/dL — AB
SPECIFIC GRAVITY, URINE: 1.021 (ref 1.005–1.030)

## 2016-12-29 LAB — MRSA PCR SCREENING: MRSA by PCR: NEGATIVE

## 2016-12-29 LAB — TROPONIN I
TROPONIN I: 0.06 ng/mL — AB (ref <0.03)
TROPONIN I: 0.05 ng/mL — AB (ref <0.03)

## 2016-12-29 LAB — COMPREHENSIVE METABOLIC PANEL
ALT: 139 U/L — ABNORMAL HIGH (ref 14–54)
AST: 173 U/L — ABNORMAL HIGH (ref 15–41)
Albumin: 3.2 g/dL — ABNORMAL LOW (ref 3.5–5.0)
Alkaline Phosphatase: 125 U/L (ref 38–126)
Anion gap: 14 (ref 5–15)
BILIRUBIN TOTAL: 4.5 mg/dL — AB (ref 0.3–1.2)
BUN: 18 mg/dL (ref 6–20)
CO2: 22 mmol/L (ref 22–32)
Calcium: 9.3 mg/dL (ref 8.9–10.3)
Chloride: 103 mmol/L (ref 101–111)
Creatinine, Ser: 1.43 mg/dL — ABNORMAL HIGH (ref 0.44–1.00)
GFR calc Af Amer: 36 mL/min — ABNORMAL LOW (ref >60)
GFR, EST NON AFRICAN AMERICAN: 31 mL/min — AB (ref >60)
Glucose, Bld: 175 mg/dL — ABNORMAL HIGH (ref 65–99)
POTASSIUM: 4.1 mmol/L (ref 3.5–5.1)
Sodium: 139 mmol/L (ref 135–145)
TOTAL PROTEIN: 7.2 g/dL (ref 6.5–8.1)

## 2016-12-29 LAB — LIPASE, BLOOD: LIPASE: 35 U/L (ref 11–51)

## 2016-12-29 LAB — AMMONIA: Ammonia: 29 umol/L (ref 9–35)

## 2016-12-29 LAB — LACTIC ACID, PLASMA: Lactic Acid, Venous: 3.5 mmol/L (ref 0.5–1.9)

## 2016-12-29 SURGERY — ERCP, WITH INTERVENTION IF INDICATED
Anesthesia: General

## 2016-12-29 MED ORDER — LACTATED RINGERS IV SOLN
INTRAVENOUS | Status: DC | PRN
  Administered 2016-12-29 (×2): via INTRAVENOUS

## 2016-12-29 MED ORDER — PHENYLEPHRINE HCL 10 MG/ML IJ SOLN
INTRAMUSCULAR | Status: DC | PRN
  Administered 2016-12-29: 80 ug via INTRAVENOUS
  Administered 2016-12-29: 40 ug via INTRAVENOUS

## 2016-12-29 MED ORDER — GLUCAGON HCL RDNA (DIAGNOSTIC) 1 MG IJ SOLR
INTRAMUSCULAR | Status: AC
  Filled 2016-12-29: qty 2

## 2016-12-29 MED ORDER — LEVOTHYROXINE SODIUM 100 MCG PO TABS
100.0000 ug | ORAL_TABLET | Freq: Every day | ORAL | Status: DC
  Administered 2016-12-30 – 2016-12-31 (×2): 100 ug via ORAL
  Filled 2016-12-29 (×3): qty 1

## 2016-12-29 MED ORDER — STERILE WATER FOR IRRIGATION IR SOLN
Status: DC | PRN
  Administered 2016-12-29: 20 mL

## 2016-12-29 MED ORDER — PANTOPRAZOLE SODIUM 40 MG IV SOLR
40.0000 mg | INTRAVENOUS | Status: DC
  Administered 2016-12-29: 40 mg via INTRAVENOUS
  Filled 2016-12-29 (×2): qty 40

## 2016-12-29 MED ORDER — PIPERACILLIN-TAZOBACTAM 3.375 G IVPB 30 MIN
3.3750 g | Freq: Once | INTRAVENOUS | Status: AC
  Administered 2016-12-29: 3.375 g via INTRAVENOUS
  Filled 2016-12-29: qty 50

## 2016-12-29 MED ORDER — SUCCINYLCHOLINE CHLORIDE 20 MG/ML IJ SOLN
INTRAMUSCULAR | Status: AC
  Filled 2016-12-29: qty 1

## 2016-12-29 MED ORDER — IOPAMIDOL (ISOVUE-300) INJECTION 61%
INTRAVENOUS | Status: AC
  Filled 2016-12-29: qty 100

## 2016-12-29 MED ORDER — SODIUM CHLORIDE 0.9 % IV SOLN
INTRAVENOUS | Status: DC
  Administered 2016-12-29: 75 mL via INTRAVENOUS
  Administered 2016-12-29: 22:00:00 via INTRAVENOUS

## 2016-12-29 MED ORDER — MIDAZOLAM HCL 2 MG/2ML IJ SOLN
INTRAMUSCULAR | Status: AC
  Filled 2016-12-29: qty 2

## 2016-12-29 MED ORDER — DILTIAZEM HCL ER COATED BEADS 120 MG PO CP24
120.0000 mg | ORAL_CAPSULE | Freq: Every day | ORAL | Status: DC
  Administered 2016-12-30 – 2016-12-31 (×2): 120 mg via ORAL
  Filled 2016-12-29 (×3): qty 1

## 2016-12-29 MED ORDER — ACETAMINOPHEN 650 MG RE SUPP
650.0000 mg | Freq: Four times a day (QID) | RECTAL | Status: DC | PRN
  Filled 2016-12-29: qty 1

## 2016-12-29 MED ORDER — SODIUM CHLORIDE 0.9 % IV BOLUS (SEPSIS): 500.0000 mL | Freq: Once | INTRAVENOUS | Status: DC

## 2016-12-29 MED ORDER — FENTANYL CITRATE (PF) 100 MCG/2ML IJ SOLN
INTRAMUSCULAR | Status: AC
  Filled 2016-12-29: qty 2

## 2016-12-29 MED ORDER — IOPAMIDOL (ISOVUE-M 300) INJECTION 61%
INTRAMUSCULAR | Status: DC | PRN
  Administered 2016-12-29: 25 mL via INTRATHECAL

## 2016-12-29 MED ORDER — SODIUM CHLORIDE 0.9 % IV BOLUS (SEPSIS)
500.0000 mL | Freq: Once | INTRAVENOUS | Status: AC
  Administered 2016-12-29: 500 mL via INTRAVENOUS

## 2016-12-29 MED ORDER — HYDROMORPHONE HCL 1 MG/ML IJ SOLN: 0.5000 mg | INTRAMUSCULAR | Status: DC | PRN

## 2016-12-29 MED ORDER — ONDANSETRON HCL 4 MG/2ML IJ SOLN: 4.0000 mg | Freq: Four times a day (QID) | INTRAMUSCULAR | Status: DC | PRN

## 2016-12-29 MED ORDER — MIDAZOLAM HCL 5 MG/5ML IJ SOLN
INTRAMUSCULAR | Status: DC | PRN
  Administered 2016-12-29: 0.5 mg via INTRAVENOUS

## 2016-12-29 MED ORDER — ONDANSETRON HCL 4 MG PO TABS
4.0000 mg | ORAL_TABLET | Freq: Four times a day (QID) | ORAL | Status: DC | PRN
  Filled 2016-12-29: qty 1

## 2016-12-29 MED ORDER — SEVOFLURANE IN SOLN
RESPIRATORY_TRACT | Status: AC
  Filled 2016-12-29: qty 250

## 2016-12-29 MED ORDER — APIXABAN 2.5 MG PO TABS: 2.5000 mg | ORAL_TABLET | Freq: Two times a day (BID) | ORAL | Status: DC

## 2016-12-29 MED ORDER — ACETAMINOPHEN 325 MG PO TABS: 650.0000 mg | ORAL_TABLET | Freq: Four times a day (QID) | ORAL | Status: DC | PRN

## 2016-12-29 MED ORDER — PIPERACILLIN-TAZOBACTAM 3.375 G IVPB
3.3750 g | Freq: Three times a day (TID) | INTRAVENOUS | Status: DC
  Administered 2016-12-29 – 2016-12-31 (×5): 3.375 g via INTRAVENOUS
  Filled 2016-12-29 (×6): qty 50

## 2016-12-29 MED ORDER — SIMETHICONE 40 MG/0.6ML PO SUSP
ORAL | Status: AC
  Filled 2016-12-29: qty 30

## 2016-12-29 MED ORDER — ETOMIDATE 2 MG/ML IV SOLN
INTRAVENOUS | Status: DC | PRN
  Administered 2016-12-29: 10 mg via INTRAVENOUS

## 2016-12-29 MED ORDER — SODIUM CHLORIDE 0.9 % IV BOLUS (SEPSIS)
1000.0000 mL | Freq: Once | INTRAVENOUS | Status: AC
  Administered 2016-12-29: 1000 mL via INTRAVENOUS

## 2016-12-29 MED ORDER — ROCURONIUM BROMIDE 100 MG/10ML IV SOLN
INTRAVENOUS | Status: DC | PRN
Start: 2016-12-29 — End: 2016-12-29
  Administered 2016-12-29: 5 mg via INTRAVENOUS

## 2016-12-29 MED ORDER — SUCCINYLCHOLINE CHLORIDE 20 MG/ML IJ SOLN
INTRAMUSCULAR | Status: DC | PRN
  Administered 2016-12-29: 100 mg via INTRAVENOUS

## 2016-12-29 MED ORDER — ONDANSETRON HCL 4 MG/2ML IJ SOLN
INTRAMUSCULAR | Status: DC | PRN
  Administered 2016-12-29: 4 mg via INTRAVENOUS

## 2016-12-29 MED ORDER — SODIUM CHLORIDE 0.9 % IV BOLUS (SEPSIS): 250.0000 mL | Freq: Once | INTRAVENOUS | Status: DC

## 2016-12-29 NOTE — Transfer of Care (Signed)
Immediate Anesthesia Transfer of Care Note  Patient: Regina [name]  Procedure(s) Performed: Procedure(s): ENDOSCOPIC RETROGRADE CHOLANGIOPANCREATOGRAPHY (ERCP) WITH STENT EXCHANGE (N/A)  Patient Location: PACU  Anesthesia Type:General  Level of Consciousness: confused  Airway & Oxygen Therapy: Patient Spontanous Breathing and Patient connected to face mask oxygen  Post-op Assessment: Report given to RN and Post -op Vital signs reviewed and stable  Post vital signs: Reviewed and stable  Last Vitals:  Vitals:   12/29/16 1500 12/29/16 1605  BP:  95/60  Pulse: 80 80  Resp: 20 16  Temp:  36.6 C    Last Pain:  Vitals:   12/29/16 1605  TempSrc: Oral  PainSc: 3       Patients Stated Pain Goal: 0 (04/88/89 1694)  Complications: No apparent anesthesia complications

## 2016-12-29 NOTE — ED Provider Notes (Signed)
[facility] DEPT Provider Note   CSN: [id number] Arrival date & time: 12/29/16  1303     History   Chief Complaint Chief Complaint  Patient presents with  . Abdominal Pain  . Back Pain    HPI Regina [name] is a 83 y.o. female.  The history is provided by the patient and a relative. The history is limited by the condition of the patient (confusion per family).  Abdominal Pain    Back Pain   Associated symptoms include abdominal pain.    Pt was seen at 1340.  Per pt and her family, c/o gradual onset and persistence of constant upper abd "pain" since overnight last night. Pt describes the pain as located in her RUQ and "aching."  Has been associated with nausea and mid-back pain.  Family feels pt is also mildly confused. Family states pt's symptoms are similar to "when she needed the stent." Pt had biliary stent placed during admit 11/10/2016 for dx jaundice and cholangitis. Denies CP/SOB, no vomiting/diarrhea, no fevers.     Past Medical History:  Diagnosis Date  . Anemia   . Arthritis   . Avascular necrosis of bone of left hip ([place])   . Dysrhythmia 4/14   atrial fib post op  . H/O hiatal hernia   . History of blood transfusion   . Hypertension   . Hypothyroidism   . Stroke [facility]) 2003   No deficits    Patient Active Problem List   Diagnosis Date Noted  . RUQ pain   . Choledochitis   . Calculus of common bile duct with obstruction   . Jaundice 11/10/2016  . Common bile duct stone 04/10/2016  . Choledocholithiasis 02/08/2016  . Surgical wound infection 10/17/2013  . Acute blood loss anemia 10/09/2013  . Constipation 10/09/2013  . Avascular necrosis of bone of left hip ([place]) 10/03/2013  . Status post THR (total hip replacement) 10/03/2013  . Atrial fibrillation ([place]) 09/14/2012  . Hypokalemia 09/14/2012  . Fracture of femoral neck, left ([place]) 09/13/2012  . Essential hypertension, benign 09/13/2012  . Unspecified hypothyroidism 09/13/2012    Past Surgical  History:  Procedure Laterality Date  . BALLOON DILATION N/A 05/05/2016   Procedure: BALLOON DILATION OF SPHINCTEROTOMY;  Surgeon: Rogene Houston, MD;  [facility];  Service: Endoscopy;  Laterality: N/A;  . ERCP N/A 03/31/2016   Procedure: ENDOSCOPIC RETROGRADE CHOLANGIOPANCREATOGRAPHY (ERCP);  Surgeon: Rogene Houston, MD;  [facility];  Service: Endoscopy;  Laterality: N/A;  . ERCP N/A 05/05/2016   Procedure: ENDOSCOPIC RETROGRADE CHOLANGIOPANCREATOGRAPHY (ERCP);  Surgeon: Rogene Houston, MD;  [facility];  Service: Endoscopy;  Laterality: N/A;  do NOT move per [facility]  . ERCP N/A 11/11/2016   Procedure: ENDOSCOPIC RETROGRADE CHOLANGIOPANCREATOGRAPHY (ERCP);  Surgeon: Ronnette Juniper, MD;  Location: [facility];  Service: Gastroenterology;  Laterality: N/A;  PRONE POSITION  . EYE SURGERY Right   . GASTROINTESTINAL STENT REMOVAL N/A 05/05/2016   Procedure: Biliary stent removal.;  Surgeon: Rogene Houston, MD;  [facility];  Service: Endoscopy;  Laterality: N/A;  . HIP PINNING,CANNULATED Left 09/13/2012   Procedure: ASNIS HIP PINNING LEFT;  Surgeon: Sanjuana Kava, MD;  Location: [facility];  Service: Orthopedics;  Laterality: Left;  . LITHOTRIPSY N/A 03/31/2016   Procedure: MECHANICAL LITHOTRIPSY;  Surgeon: Rogene Houston, MD;  [facility];  Service: Endoscopy;  Laterality: N/A;  . Mouth Sugery     Upper teeth impacted  . REMOVAL OF STONES  N/A 03/31/2016   Procedure: PARTIAL RETRIEVAL OF STONE AND STENTING;  Surgeon: Rogene Houston, MD;  [facility];  Service: Endoscopy;  Laterality: N/A;  . REMOVAL OF STONES N/A 05/05/2016   Procedure: REMOVAL OF STONES;  Surgeon: Rogene Houston, MD;  [facility];  Service: Endoscopy;  Laterality: N/A;  . SPHINCTEROTOMY N/A 03/31/2016   Procedure: SPHINCTEROTOMY;  Surgeon: Rogene Houston, MD;  [facility];  Service: Endoscopy;  Laterality: N/A;  . SPYGLASS CHOLANGIOSCOPY  N/A 05/05/2016   Procedure: FOYDXAJO CHOLANGIOSCOPY;  Surgeon: Rogene Houston, MD;  [facility];  Service: Endoscopy;  Laterality: N/A;  . TOTAL HIP ARTHROPLASTY Left 10/03/2013   Procedure: LEFT TOTAL HIP ARTHROPLASTY ANTERIOR APPROACH and REMOVAL OF SCREWS LEFT HIP;  Surgeon: Mcarthur Rossetti, MD;  Location: [facility];  Service: Orthopedics;  Laterality: Left;    OB History    Gravida Para Term Preterm AB Living             2   SAB TAB Ectopic Multiple Live Births                   Home Medications    Prior to Admission medications   Medication Sig Start Date End Date Taking? Authorizing Provider  acetaminophen (TYLENOL) 325 MG tablet Take 325 mg by mouth every 6 (six) hours as needed for headache (pain).    [provider]  apixaban (ELIQUIS) 2.5 MG TABS tablet Take 1 tablet (2.5 mg total) by mouth 2 (two) times daily. 11/13/16   Steve Rattler, DO  clotrimazole-betamethasone (LOTRISONE) cream Apply 1 application topically 2 (two) times daily as needed (rash).  09/28/16   [provider]  diltiazem (CARDIZEM CD) 120 MG 24 hr capsule take 1 capsule by mouth once daily 10/16/16   Herminio Commons, MD  levothyroxine (SYNTHROID, LEVOTHROID) 100 MCG tablet Take 100 mcg by mouth daily.     [provider]  metoprolol tartrate (LOPRESSOR) 25 MG tablet take 1 tablet by mouth twice a day 12/02/15   Arnoldo Lenis, MD  metoprolol tartrate (LOPRESSOR) 25 MG tablet take 1 tablet twice a day 10/02/16   Herminio Commons, MD  metoprolol tartrate (LOPRESSOR) 25 MG tablet take 1 tablet twice a day 12/25/16   Herminio Commons, MD  pantoprazole (PROTONIX) 40 MG tablet Take 40 mg by mouth daily. 10/23/16   [provider]  ursodiol (ACTIGALL) 250 MG tablet Take 1 tablet (250 mg total) by mouth 2 (two) times daily. 12/14/16   Rogene Houston, MD    Family History Family History  Problem Relation Age of Onset  . Heart attack Mother   . Heart  attack Father   . Cancer Brother     Social History Social History  Substance Use Topics  . Smoking status: Never Smoker  . Smokeless tobacco: Never Used     Comment: Smoked a few times in college (socially)  . Alcohol use No     Comment: None since Christmas 2013     Allergies   Patient has no known allergies.   Review of Systems Review of Systems  Unable to perform ROS: Mental status change  Gastrointestinal: Positive for abdominal pain.  Musculoskeletal: Positive for back pain.     Physical Exam Updated Vital Signs BP (!) 94/54   Pulse 80   Temp 98.4 F (36.9 C) (Oral)   Resp 20   SpO2 96%  Patient Vitals for the past 24 hrs:  BP Temp Temp src Pulse Resp SpO2  12/29/16 1500 - - - 80 20 96 %  12/29/16 1445 - - - 75 20 95 %  12/29/16 1430 (!) 94/54 - - 76 (!) 27 95 %  12/29/16 1415 - - - 77 (!) 26 92 %  12/29/16 1400 (!) 86/53 - - 90 (!) 24 94 %  12/29/16 1329 (!) 98/57 - - - - -  12/29/16 1325 (!) 89/60 98.4 F (36.9 C) Oral 94 (!) 21 94 %      Physical Exam 1345: Physical examination:  Nursing notes reviewed; Vital signs and O2 SAT reviewed;  Constitutional: Well developed, Well nourished, Well hydrated, In no acute distress; Head:  Normocephalic, atraumatic; Eyes: EOMI, PERRL, +scleral icterus; ENMT: Mouth and pharynx normal, Mucous membranes moist; Neck: Supple, Full range of motion, No lymphadenopathy; Cardiovascular: Regular rate and rhythm, No gallop; Respiratory: Breath sounds clear & equal bilaterally, No wheezes.  Speaking full sentences with ease, Normal respiratory effort/excursion; Chest: Nontender, Movement normal; Abdomen: Soft, Nontender, Nondistended, Normal bowel sounds; Genitourinary: No CVA tenderness; Extremities: Pulses normal, No tenderness, No edema, No calf edema or asymmetry.; Neuro: Awake, alert, mildly confused re: events. Major CN grossly intact. No facial droop. Speech clear. No gross focal motor or sensory deficits in extremities.;  Skin: Color pale, Warm, Dry.    ED Treatments / Results  Labs (all labs ordered are listed, but only abnormal results are displayed)   EKG  EKG Interpretation  Date/Time:  Friday December 29 2016 13:54:02 EDT Ventricular Rate:  101 PR Interval:    QRS Duration: 87 QT Interval:  368 QTC Calculation: 477 R Axis:   103 Text Interpretation:  Age not entered, assumed to be  83 years old for purpose of ECG interpretation Atrial fibrillation Probable lateral infarct, old Probable anteroseptal infarct, old When compared with ECG of 11/10/2016 No significant change was found Confirmed by [facility]  MD, Nunzio Cory [phone]) on 12/29/2016 2:07:23 PM       Radiology   Procedures Procedures (including critical care time)  Medications Ordered in ED Medications  0.9 %  sodium chloride infusion (75 mLs Intravenous New Bag/Given 12/29/16 1403)  piperacillin-tazobactam (ZOSYN) IVPB 3.375 g (not administered)  iopamidol (ISOVUE-300) 61 % injection (not administered)  glucagon (human recombinant) (GLUCAGEN) 1 MG injection (not administered)  sodium chloride 0.9 % bolus 500 mL (0 mLs Intravenous Stopped 12/29/16 1420)  sodium chloride 0.9 % bolus 500 mL (0 mLs Intravenous Stopped 12/29/16 1508)     Initial Impression / Assessment and Plan / ED Course  I have reviewed the triage vital signs and the nursing notes.  Pertinent labs & imaging results that were available during my care of the patient were reviewed by me and considered in my medical decision making (see chart for details).  MDM Reviewed: previous chart, nursing note and vitals Reviewed previous: labs and ECG Interpretation: labs, ECG and x-ray Total time providing critical care: 30-74 minutes. This excludes time spent performing separately reportable procedures and services. Consults: admitting MD and gastrointestinal   CRITICAL CARE Performed by: Alfonzo Feller Total critical care time: 35 minutes Critical care time was exclusive  of separately billable procedures and treating other patients. Critical care was necessary to treat or prevent imminent or life-threatening deterioration. Critical care was time spent personally by me on the following activities: development of treatment plan with patient and/or surrogate as well as nursing, discussions with consultants, evaluation of patient's response  to treatment, examination of patient, obtaining history from patient or surrogate, ordering and performing treatments and interventions, ordering and review of laboratory studies, ordering and review of radiographic studies, pulse oximetry and re-evaluation of patient's condition.   Results for orders placed or performed during the hospital encounter of 12/29/16  Comprehensive metabolic panel  Result Value Ref Range   Sodium 139 135 - 145 mmol/L   Potassium 4.1 3.5 - 5.1 mmol/L   Chloride 103 101 - 111 mmol/L   CO2 22 22 - 32 mmol/L   Glucose, Bld 175 (H) 65 - 99 mg/dL   BUN 18 6 - 20 mg/dL   Creatinine, Ser 1.43 (H) 0.44 - 1.00 mg/dL   Calcium 9.3 8.9 - 10.3 mg/dL   Total Protein 7.2 6.5 - 8.1 g/dL   Albumin 3.2 (L) 3.5 - 5.0 g/dL   AST 173 (H) 15 - 41 U/L   ALT 139 (H) 14 - 54 U/L   Alkaline Phosphatase 125 38 - 126 U/L   Total Bilirubin 4.5 (H) 0.3 - 1.2 mg/dL   GFR calc non Af Amer 31 (L) >60 mL/min   GFR calc Af Amer 36 (L) >60 mL/min   Anion gap 14 5 - 15  Lipase, blood  Result Value Ref Range   Lipase 35 11 - 51 U/L  CBC with Differential  Result Value Ref Range   WBC 24.1 (H) 4.0 - 10.5 K/uL   RBC 4.62 3.87 - 5.11 MIL/uL   Hemoglobin 13.7 12.0 - 15.0 g/dL   HCT 41.9 36.0 - 46.0 %   MCV 90.7 78.0 - 100.0 fL   MCH 29.7 26.0 - 34.0 pg   MCHC 32.7 30.0 - 36.0 g/dL   RDW 15.8 (H) 11.5 - 15.5 %   Platelets 159 150 - 400 K/uL   Neutrophils Relative % 92 %   Neutro Abs 22.1 (H) 1.7 - 7.7 K/uL   Lymphocytes Relative 2 %   Lymphs Abs 0.4 (L) 0.7 - 4.0 K/uL   Monocytes Relative 6 %   Monocytes Absolute 1.5 (H)  0.1 - 1.0 K/uL   Eosinophils Relative 0 %   Eosinophils Absolute 0.0 0.0 - 0.7 K/uL   Basophils Relative 0 %   Basophils Absolute 0.0 0.0 - 0.1 K/uL  Troponin I  Result Value Ref Range   Troponin I 0.06 (HH) <0.03 ng/mL  Protime-INR  Result Value Ref Range   Prothrombin Time 16.4 (H) 11.4 - 15.2 seconds   INR 1.32    Dg Chest 1 View Result Date: 12/29/2016 CLINICAL DATA:  Elevated white count, abdomen pain with nausea EXAM: CHEST 1 VIEW COMPARISON:  11/10/2016, MRI 11/11/2016 FINDINGS: Cardiomegaly. Unable to rule out atelectasis or infiltrate at the left lung base. Aortic atherosclerosis. Large hiatal hernia. IMPRESSION: 1. Large hiatal hernia 2. Cardiomegaly with mild central congestion 3. Unable to rule out atelectasis or infiltrate at the left lung base. Electronically Signed   By: Donavan Foil M.D.   On: 12/29/2016 15:03   Results for KELCEY, KORUS (MRN [id number]) as of 12/29/2016 15:33  Ref. Range 11/11/2016 04:52 11/12/2016 11:25 11/13/2016 02:05 12/11/2016 11:29 12/29/2016 13:58  AST Latest Ref Range: 15 - 41 U/L 136 (H) 73 (H) 62 (H) 21 173 (H)  ALT Latest Ref Range: 14 - 54 U/L 117 (H) 79 (H) 76 (H) 15 139 (H)  Total Bilirubin Latest Ref Range: 0.3 - 1.2 mg/dL 18.8 (HH) 11.0 (H) 9.0 (H) 2.1 (H) 4.5 (H)   1500:  BP  slightly lower than baseline. IVF 19ml/kg total given with slow improvement. BC and UC obtained. LFT's elevated again.  T/C to GI Dr. Laural Golden, case discussed, including:  HPI, pertinent PM/SHx, VS/PE, dx testing, ED course and treatment:  Hold CT scan for now, he will come to ED for evaluation for ERCP, requests to start IV abx.    1530:  T/C to [facility] Dr. Nehemiah Settle, case discussed, including:  HPI, pertinent PM/SHx, VS/PE, dx testing, ED course and treatment, as well as d/w GI MD:  Agreeable to admit.    Final Clinical Impressions(s) / ED Diagnoses   Final diagnoses:  Fever    New Prescriptions New Prescriptions   No medications on file      Francine Graven,  DO 01/01/17 2149

## 2016-12-29 NOTE — H&P (Signed)
History and Physical  ANYSIA CHOI ZOX:[id number] DOB: [birth year] DOA: 12/29/2016  Referring physician: Dr Thurnell Garbe, ED physician PCP: Sinda Du, MD  Outpatient Specialists:   Bronson Ing (Cards)  Rehman (GI)  Patient Coming From: home  Chief Complaint: abdominal pain, nausea/vomiting  HPI: SHAMIYAH NGU is a 83 y.o. female with a history of retention, atrial fibrillation with chads 2 vas score of 5, history of stroke, hypothyroidism. Patient was recently hospitalized at [facility] for cholidochitis secondary to common bile duct stone, elevated LFTs. She had an ERCP during that hospitalization with stent placement and had reduction of her white count, LFTs, bilirubin. Patient was discharged to home. Last night, the patient began to have constant right upper quadrant pain that was described as aching with nausea and pain radiating into her midback. Patient appears to be mildly confused per family. Her symptoms are similar to that previous to her ERCP with stenting. Patient currently denies chest pain, shortness of breath. The emergency room she is not vomiting or had any diarrheas or fevers. No biliary no provoking factors.  Emergency Department Course: Laboratory data shows elevated LFTs with AST of 173, ALT of 139, bilirubin of 4.5. Her troponin is 0.06 and white count is 24.1. Chest x-ray negative for pneumonia  Review Systems:   Pt denies any fevers, chills, diarrhea, constipation, shortness of breath, dyspnea on exertion, orthopnea, cough, wheezing, palpitations, headache, vision changes, lightheadedness, dizziness, melena, rectal bleeding.  Review of systems are otherwise negative  Past Medical History:  Diagnosis Date  . Anemia   . Arthritis   . Avascular necrosis of bone of left hip ([place])   . Dysrhythmia 4/14   atrial fib post op  . H/O hiatal hernia   . History of blood transfusion   . Hypertension   . Hypothyroidism   . Stroke [facility]) 2003   No deficits     Past Surgical History:  Procedure Laterality Date  . BALLOON DILATION N/A 05/05/2016   Procedure: BALLOON DILATION OF SPHINCTEROTOMY;  Surgeon: Rogene Houston, MD;  [facility];  Service: Endoscopy;  Laterality: N/A;  . ERCP N/A 03/31/2016   Procedure: ENDOSCOPIC RETROGRADE CHOLANGIOPANCREATOGRAPHY (ERCP);  Surgeon: Rogene Houston, MD;  [facility];  Service: Endoscopy;  Laterality: N/A;  . ERCP N/A 05/05/2016   Procedure: ENDOSCOPIC RETROGRADE CHOLANGIOPANCREATOGRAPHY (ERCP);  Surgeon: Rogene Houston, MD;  [facility];  Service: Endoscopy;  Laterality: N/A;  do NOT move per [facility]  . ERCP N/A 11/11/2016   Procedure: ENDOSCOPIC RETROGRADE CHOLANGIOPANCREATOGRAPHY (ERCP);  Surgeon: Ronnette Juniper, MD;  Location: [place];  Service: Gastroenterology;  Laterality: N/A;  PRONE POSITION  . EYE SURGERY Right   . GASTROINTESTINAL STENT REMOVAL N/A 05/05/2016   Procedure: Biliary stent removal.;  Surgeon: Rogene Houston, MD;  [facility];  Service: Endoscopy;  Laterality: N/A;  . HIP PINNING,CANNULATED Left 09/13/2012   Procedure: ASNIS HIP PINNING LEFT;  Surgeon: Sanjuana Kava, MD;  [facility];  Service: Orthopedics;  Laterality: Left;  . LITHOTRIPSY N/A 03/31/2016   Procedure: MECHANICAL LITHOTRIPSY;  Surgeon: Rogene Houston, MD;  [facility];  Service: Endoscopy;  Laterality: N/A;  . Mouth Sugery     Upper teeth impacted  . REMOVAL OF STONES N/A 03/31/2016   Procedure: PARTIAL RETRIEVAL OF STONE AND STENTING;  Surgeon: Rogene Houston, MD;  [facility];  Service: Endoscopy;  Laterality: N/A;  . REMOVAL OF STONES N/A 05/05/2016  Procedure: REMOVAL OF STONES;  Surgeon: Rogene Houston, MD;  [facility];  Service: Endoscopy;  Laterality: N/A;  . SPHINCTEROTOMY N/A 03/31/2016   Procedure: SPHINCTEROTOMY;  Surgeon: Rogene Houston, MD;  [facility];  Service: Endoscopy;  Laterality: N/A;  .  SPYGLASS CHOLANGIOSCOPY N/A 05/05/2016   Procedure: ZOXWRUEA CHOLANGIOSCOPY;  Surgeon: Rogene Houston, MD;  [facility];  Service: Endoscopy;  Laterality: N/A;  . TOTAL HIP ARTHROPLASTY Left 10/03/2013   Procedure: LEFT TOTAL HIP ARTHROPLASTY ANTERIOR APPROACH and REMOVAL OF SCREWS LEFT HIP;  Surgeon: Mcarthur Rossetti, MD;  [facility];  Service: Orthopedics;  Laterality: Left;   Social History:  reports that she has never smoked. She has never used smokeless tobacco. She reports that she does not drink alcohol or use drugs. Patient lives at Home  No Known Allergies  Family History  Problem Relation Age of Onset  . Heart attack Mother   . Heart attack Father   . Cancer Brother      Prior to Admission medications   Medication Sig Start Date End Date Taking? Authorizing Provider  acetaminophen (TYLENOL) 325 MG tablet Take 325 mg by mouth every 6 (six) hours as needed for headache (pain).    [provider]  apixaban (ELIQUIS) 2.5 MG TABS tablet Take 1 tablet (2.5 mg total) by mouth 2 (two) times daily. 11/13/16   Steve Rattler, DO  clotrimazole-betamethasone (LOTRISONE) cream Apply 1 application topically 2 (two) times daily as needed (rash).  09/28/16   [provider]  diltiazem (CARDIZEM CD) 120 MG 24 hr capsule take 1 capsule by mouth once daily 10/16/16   Herminio Commons, MD  levothyroxine (SYNTHROID, LEVOTHROID) 100 MCG tablet Take 100 mcg by mouth daily.     [provider]  metoprolol tartrate (LOPRESSOR) 25 MG tablet take 1 tablet by mouth twice a day 12/02/15   Arnoldo Lenis, MD  metoprolol tartrate (LOPRESSOR) 25 MG tablet take 1 tablet twice a day 10/02/16   Herminio Commons, MD  metoprolol tartrate (LOPRESSOR) 25 MG tablet take 1 tablet twice a day 12/25/16   Herminio Commons, MD  pantoprazole (PROTONIX) 40 MG tablet Take 40 mg by mouth daily. 10/23/16   [provider]  ursodiol (ACTIGALL) 250 MG tablet  Take 1 tablet (250 mg total) by mouth 2 (two) times daily. 12/14/16   Rogene Houston, MD    Physical Exam: BP (!) 94/54   Pulse 80   Temp 98.4 F (36.9 C) (Oral)   Resp 20   SpO2 96%   General: Elderly Caucasian female. Awake and alert and oriented x3. No acute cardiopulmonary distress.  HEENT: Normocephalic atraumatic.  Right and left ears normal in appearance.  Pupils equal, round, reactive to light. Extraocular muscles are intact. Sclerae anicteric and noninjected.  Moist mucosal membranes. No mucosal lesions.  Neck: Neck supple without lymphadenopathy. No carotid bruits. No masses palpated.  Cardiovascular: Regular rate with normal S1-S2 sounds. No murmurs, rubs, gallops auscultated. No JVD.  Respiratory: Good respiratory effort with no wheezes, rales, rhonchi. Lungs clear to auscultation bilaterally.  No accessory muscle use. Abdomen: Soft, mild tenderness to right upper quadrant, nondistended. Active bowel sounds. No masses or hepatosplenomegaly  Skin: No rashes, lesions, or ulcerations.  Dry, warm to touch. 2+ dorsalis pedis and radial pulses. Musculoskeletal: No calf or leg pain. All major joints not erythematous nontender.  No upper or lower joint deformation.  Good ROM.  No contractures  Psychiatric: Intact judgment and insight. Pleasant and cooperative. Neurologic: No focal neurological deficits.            Labs on Admission: I have personally reviewed following labs and imaging studies  CBC:  Recent Labs Lab 12/29/16 1358  WBC 24.1*  NEUTROABS 22.1*  HGB 13.7  HCT 41.9  MCV 90.7  PLT 888   Basic Metabolic Panel:  Recent Labs Lab 12/29/16 1358  NA 139  K 4.1  CL 103  CO2 22  GLUCOSE 175*  BUN 18  CREATININE 1.43*  CALCIUM 9.3   GFR: CrCl cannot be calculated (Unknown ideal weight.). Liver Function Tests:  Recent Labs Lab 12/29/16 1358  AST 173*  ALT 139*  ALKPHOS 125  BILITOT 4.5*  PROT 7.2  ALBUMIN 3.2*    Recent Labs Lab  12/29/16 1358  LIPASE 35   No results for input(s): AMMONIA in the last 168 hours. Coagulation Profile:  Recent Labs Lab 12/29/16 1335  INR 1.32   Cardiac Enzymes:  Recent Labs Lab 12/29/16 1358  TROPONINI 0.06*   BNP (last 3 results) No results for input(s): PROBNP in the last 8760 hours. HbA1C: No results for input(s): HGBA1C in the last 72 hours. CBG: No results for input(s): GLUCAP in the last 168 hours. Lipid Profile: No results for input(s): CHOL, HDL, LDLCALC, TRIG, CHOLHDL, LDLDIRECT in the last 72 hours. Thyroid Function Tests: No results for input(s): TSH, T4TOTAL, FREET4, T3FREE, THYROIDAB in the last 72 hours. Anemia Panel: No results for input(s): VITAMINB12, FOLATE, FERRITIN, TIBC, IRON, RETICCTPCT in the last 72 hours. Urine analysis:    Component Value Date/Time   COLORURINE AMBER (A) 11/10/2016 1529   APPEARANCEUR HAZY (A) 11/10/2016 1529   LABSPEC 1.027 11/10/2016 1529   PHURINE 5.0 11/10/2016 1529   GLUCOSEU NEGATIVE 11/10/2016 1529   HGBUR MODERATE (A) 11/10/2016 1529   BILIRUBINUR MODERATE (A) 11/10/2016 1529   KETONESUR NEGATIVE 11/10/2016 1529   PROTEINUR 100 (A) 11/10/2016 1529   UROBILINOGEN 0.2 09/25/2013 1402   NITRITE NEGATIVE 11/10/2016 1529   LEUKOCYTESUR NEGATIVE 11/10/2016 1529   Sepsis Labs: @LABRCNTIP (procalcitonin:4,lacticidven:4) )No results found for this or any previous visit (from the past 240 hour(s)).   Radiological Exams on Admission: Dg Chest 1 View  Result Date: 12/29/2016 CLINICAL DATA:  Elevated white count, abdomen pain with nausea EXAM: CHEST 1 VIEW COMPARISON:  11/10/2016, MRI 11/11/2016 FINDINGS: Cardiomegaly. Unable to rule out atelectasis or infiltrate at the left lung base. Aortic atherosclerosis. Large hiatal hernia. IMPRESSION: 1. Large hiatal hernia 2. Cardiomegaly with mild central congestion 3. Unable to rule out atelectasis or infiltrate at the left lung base. Electronically Signed   By: Donavan Foil  M.D.   On: 12/29/2016 15:03    EKG: Independently reviewed. Atrial fibrillation. No acute ST changes.  Assessment/Plan: Principal Problem:   Sepsis ([place]) Active Problems:   Atrial fibrillation (HCC)   Jaundice   Choledochitis   Calculus of common bile duct with obstruction   Elevated troponin    This patient was discussed with the ED physician, including pertinent vitals, physical exam findings, labs, and imaging.  We also discussed care given by the ED provider.  #1 sepsis  Patient fluid resuscitated with 30 mL's per kilogram  Start Zosyn  Blood cultures  Urine culture  Admit to stepdown unit to Dr. Luan Pulling  Hold metoprolol as blood pressures soft current moment #2 choledochitis  Zosyn  CBC tomorrow #3 jaundice  Follow LFTs #4 calculus of common bile  duct with obstruction  Dr. Laural Golden consulted, who will follow the patient and likely perform ERCP as the stent is probably blocked #5 elevated troponin  Trend troponins #6 atrial fibrillation  Currently rate controlled  Continue Cardizem  DVT prophylaxis: Continue eliquis Consultants: GI Code Status: Full code Family Communication: Son and daughter in the room  Disposition Plan: Pending   Jacob Moores [facility] Pager [phone]  If 7PM-7AM, please contact night-coverage [email or web address] Password [email or web address]

## 2016-12-29 NOTE — ED Notes (Signed)
Date and time results received: 12/29/16 1453 (use smartphrase ".now" to insert current time)  Test: Troponin 0.06 Critical Value: Troponin 0.06  Name of Provider Notified: Thurnell Garbe  Orders Received? Or Actions Taken?:

## 2016-12-29 NOTE — Consult Note (Signed)
Referring Provider: No ref. provider found Primary Care Physician:  Sinda Du, MD Primary Gastroenterologist:  Dr. Laural Golden  Reason for Consultation:    Cholangitis.  HPI:   Patient is [name] who has history of choledocholithiasis dating back to over 4 years ago. She decided to wait until she began to have symptoms. She had ERCP in October 2017 and repeated December 2017 with removal of multiple stones. I felt her duct was cleared. She presented 6 weeks ago with bilirubin of 20. She was felt to have cholangitis and treated at [facility]. She was found to have single stone in bile duct. This was felt to be retained stone. Stone could not be removed therefore 10 [place] stent was placed for biliary decompression. Patient was seen in the office in 12/14/2016 and was doing well. It was decided to bring her back for stent and stone removal. She was doing fine until last evening. She woke up this morning with pain in right upper quadrant and intrascapular region and felt nauseated and according to her son heaved a few times. I was contacted. Patient was advised to come to emergency room.. She is now pain-free. She denies chest pain or shortness of breath. According to her son she had her [birth year] past [birth year] and had a good day yesterday. She is on Eliquis and last dose was earlier this morning.   Past Medical History:  Diagnosis Date  . Anemia   . Arthritis   . Avascular necrosis of bone of left hip ([place])   . Dysrhythmia 4/14   atrial fib post op  . H/O hiatal hernia   . History of blood transfusion   . Hypertension   . Hypothyroidism   . Stroke [facility]) 2003   No deficits    Past Surgical History:  Procedure Laterality Date  . BALLOON DILATION N/A 05/05/2016   Procedure: BALLOON DILATION OF SPHINCTEROTOMY;  Surgeon: Rogene Houston, MD;  [facility];  Service: Endoscopy;  Laterality: N/A;  . ERCP N/A 03/31/2016   Procedure: ENDOSCOPIC RETROGRADE  CHOLANGIOPANCREATOGRAPHY (ERCP);  Surgeon: Rogene Houston, MD;  [facility];  Service: Endoscopy;  Laterality: N/A;  . ERCP N/A 05/05/2016   Procedure: ENDOSCOPIC RETROGRADE CHOLANGIOPANCREATOGRAPHY (ERCP);  Surgeon: Rogene Houston, MD;  [facility];  Service: Endoscopy;  Laterality: N/A;  do NOT move per [facility]  . ERCP N/A 11/11/2016   Procedure: ENDOSCOPIC RETROGRADE CHOLANGIOPANCREATOGRAPHY (ERCP);  Surgeon: Ronnette Juniper, MD;  Location: [place];  Service: Gastroenterology;  Laterality: N/A;  PRONE POSITION  . EYE SURGERY Right   . GASTROINTESTINAL STENT REMOVAL N/A 05/05/2016   Procedure: Biliary stent removal.;  Surgeon: Rogene Houston, MD;  [facility];  Service: Endoscopy;  Laterality: N/A;  . HIP PINNING,CANNULATED Left 09/13/2012   Procedure: ASNIS HIP PINNING LEFT;  Surgeon: Sanjuana Kava, MD;  [facility];  Service: Orthopedics;  Laterality: Left;  . LITHOTRIPSY N/A 03/31/2016   Procedure: MECHANICAL LITHOTRIPSY;  Surgeon: Rogene Houston, MD;  [facility];  Service: Endoscopy;  Laterality: N/A;  . Mouth Sugery     Upper teeth impacted  . REMOVAL OF STONES N/A 03/31/2016   Procedure: PARTIAL RETRIEVAL OF STONE AND STENTING;  Surgeon: Rogene Houston, MD;  [facility];  Service: Endoscopy;  Laterality: N/A;  . REMOVAL OF STONES N/A 05/05/2016   Procedure: REMOVAL OF STONES;  Surgeon: Rogene Houston, MD;  [facility];  Service: Endoscopy;  Laterality: N/A;  . SPHINCTEROTOMY N/A 03/31/2016   Procedure: SPHINCTEROTOMY;  Surgeon: Rogene Houston, MD;  [facility];  Service: Endoscopy;  Laterality: N/A;  . SPYGLASS CHOLANGIOSCOPY N/A 05/05/2016   Procedure: CZYSAYTK CHOLANGIOSCOPY;  Surgeon: Rogene Houston, MD;  [facility];  Service: Endoscopy;  Laterality: N/A;  . TOTAL HIP ARTHROPLASTY Left 10/03/2013   Procedure: LEFT TOTAL HIP ARTHROPLASTY ANTERIOR APPROACH and REMOVAL OF SCREWS LEFT  HIP;  Surgeon: Mcarthur Rossetti, MD;  [facility];  Service: Orthopedics;  Laterality: Left;    Prior to Admission medications   Medication Sig Start Date End Date Taking? Authorizing Provider  apixaban (ELIQUIS) 2.5 MG TABS tablet Take 1 tablet (2.5 mg total) by mouth 2 (two) times daily. 11/13/16  Yes Steve Rattler, DO  acetaminophen (TYLENOL) 325 MG tablet Take 325 mg by mouth every 6 (six) hours as needed for headache (pain).    [provider]  clotrimazole-betamethasone (LOTRISONE) cream Apply 1 application topically 2 (two) times daily as needed (rash).  09/28/16   [provider]  diltiazem (CARDIZEM CD) 120 MG 24 hr capsule take 1 capsule by mouth once daily 10/16/16   Herminio Commons, MD  levothyroxine (SYNTHROID, LEVOTHROID) 100 MCG tablet Take 100 mcg by mouth daily.     [provider]  metoprolol tartrate (LOPRESSOR) 25 MG tablet take 1 tablet by mouth twice a day 12/02/15   Arnoldo Lenis, MD  metoprolol tartrate (LOPRESSOR) 25 MG tablet take 1 tablet twice a day 10/02/16   Herminio Commons, MD  metoprolol tartrate (LOPRESSOR) 25 MG tablet take 1 tablet twice a day 12/25/16   Herminio Commons, MD  pantoprazole (PROTONIX) 40 MG tablet Take 40 mg by mouth daily. 10/23/16   [provider]  ursodiol (ACTIGALL) 250 MG tablet Take 1 tablet (250 mg total) by mouth 2 (two) times daily. 12/14/16   Rogene Houston, MD    Current Facility-Administered Medications  Medication Dose Route Frequency Provider Last Rate Last Dose  . 0.9 %  sodium chloride infusion   Intravenous Continuous Francine Graven, DO 75 mL/hr at 12/29/16 1403 75 mL at 12/29/16 1403  . glucagon (human recombinant) (GLUCAGEN) 1 MG injection           . iopamidol (ISOVUE-300) 61 % injection           . simethicone (MYLICON) 40 ZS/0.1UX suspension           . sodium chloride 0.9 % bolus 250 mL  250 mL Intravenous Once Francine Graven, DO      . sodium chloride  0.9 % bolus 500 mL  500 mL Intravenous Once Francine Graven, DO       Current Outpatient Prescriptions  Medication Sig Dispense Refill  . apixaban (ELIQUIS) 2.5 MG TABS tablet Take 1 tablet (2.5 mg total) by mouth 2 (two) times daily. 60 tablet 6  . acetaminophen (TYLENOL) 325 MG tablet Take 325 mg by mouth every 6 (six) hours as needed for headache (pain).    . clotrimazole-betamethasone (LOTRISONE) cream Apply 1 application topically 2 (two) times daily as needed (rash).   1  . diltiazem (CARDIZEM CD) 120 MG 24 hr capsule take 1 capsule by mouth once daily 90 capsule 2  . levothyroxine (SYNTHROID, LEVOTHROID) 100 MCG tablet Take 100 mcg by mouth daily.     . metoprolol tartrate (LOPRESSOR) 25 MG tablet take 1 tablet by mouth twice a day 180 tablet 3  .  metoprolol tartrate (LOPRESSOR) 25 MG tablet take 1 tablet twice a day 60 tablet 0  . metoprolol tartrate (LOPRESSOR) 25 MG tablet take 1 tablet twice a day 180 tablet 0  . pantoprazole (PROTONIX) 40 MG tablet Take 40 mg by mouth daily.  1  . ursodiol (ACTIGALL) 250 MG tablet Take 1 tablet (250 mg total) by mouth 2 (two) times daily. 60 tablet 5    Allergies as of 12/29/2016  . (No Known Allergies)    Family History  Problem Relation Age of Onset  . Heart attack Mother   . Heart attack Father   . Cancer Brother     Social History   Social History  . Marital status: Widowed    Spouse name: N/A  . Number of children: N/A  . Years of education: N/A   Occupational History  . Not on file.   Social History Main Topics  . Smoking status: Never Smoker  . Smokeless tobacco: Never Used     Comment: Smoked a few times in college (socially)  . Alcohol use No     Comment: None since Christmas 2013  . Drug use: No  . Sexual activity: No   Other Topics Concern  . Not on file   Social History Narrative  . No narrative on file    Review of Systems: See HPI, otherwise normal ROS  Physical Exam: Temp:  [97.9 F (36.6 C)-98.4  F (36.9 C)] 97.9 F (36.6 C) (07/27 1605) Pulse Rate:  [75-94] 80 (07/27 1605) Resp:  [16-27] 16 (07/27 1605) BP: (86-98)/(53-60) 95/60 (07/27 1605) SpO2:  [92 %-96 %] 96 % (07/27 1605)   Patient is alert and in no acute distress. Conjunctiva was pink. Sclera does not appear to be icteric. Oropharyngeal mucosa is normal. No neck masses or thyromegaly noted. Cardiac exam with regular rhythm normal S1 and S2. Faint systolic ejection murmur heard at left sternal border. Lungs are clear to auscultation. Abdomen is symmetrical soft and nontender. No organomegaly or masses. No peripheral edema or clubbing noted.  Lab Results:  Recent Labs  12/29/16 1358  WBC 24.1*  HGB 13.7  HCT 41.9  PLT 159   BMET  Recent Labs  12/29/16 1358  NA 139  K 4.1  CL 103  CO2 22  GLUCOSE 175*  BUN 18  CREATININE 1.43*  CALCIUM 9.3   LFT  Recent Labs  12/29/16 1358  PROT 7.2  ALBUMIN 3.2*  AST 173*  ALT 139*  ALKPHOS 125  BILITOT 4.5*   PT/INR  Recent Labs  12/29/16 1335  LABPROT 16.4*  INR 1.32   Hepatitis Panel No results for input(s): HEPBSAG, HCVAB, HEPAIGM, HEPBIGM in the last 72 hours.  Studies/Results: Dg Chest 1 View  Result Date: 12/29/2016 CLINICAL DATA:  Elevated white count, abdomen pain with nausea EXAM: CHEST 1 VIEW COMPARISON:  11/10/2016, MRI 11/11/2016 FINDINGS: Cardiomegaly. Unable to rule out atelectasis or infiltrate at the left lung base. Aortic atherosclerosis. Large hiatal hernia. IMPRESSION: 1. Large hiatal hernia 2. Cardiomegaly with mild central congestion 3. Unable to rule out atelectasis or infiltrate at the left lung base. Electronically Signed   By: Donavan Foil M.D.   On: 12/29/2016 15:03   Portable KUB reviewed and shows stent in bile duct.  Assessment; Patient is [name] with multiple medical problems who presents with acute onset of right upper quadrant abdominal pain and nausea. Patient's symptoms reminded her of the  time and she had acute cholangitis 6 weeks  ago. Workup in emergency room reveals cytosis elevated bilirubin and transaminases. KUB reveals stent to be in place. She has history of choledocholithiasis. She underwent ERCP about 6 weeks ago at [facility] when large stone could not be removed and stent was left in place. She therefore has occluded stent. Patient's symptoms are suggestive of acute cholangitis secondary to occluded stent. Patient is anticoagulated. Therefore sphincterotomy or balloon dilation cannot be performed. Patient was given first dose of Zosyn in emergency room.  Patient has mildly elevated troponin level. Discussed with Dr. Loma Boston who feels is due to demand ischemia.   Recommendations:  I would recommend changing the stent to relieve obstruction and bring her back electively at a later date to remove the stent and the stone. I have reviewed the procedure and risks with patient's son Regina [name] and her daughter. They are both agreeable.       LOS: 0 days   Regina [name]  12/29/2016, 4:09 PM

## 2016-12-29 NOTE — Anesthesia Procedure Notes (Signed)
Procedure Name: Intubation Date/Time: 12/29/2016 5:03 PM Performed by: Charmaine Downs Pre-anesthesia Checklist: Patient identified, Patient being monitored, Timeout performed, Emergency Drugs available and Suction available Patient Re-evaluated:Patient Re-evaluated prior to induction Oxygen Delivery Method: Circle System Utilized Preoxygenation: Pre-oxygenation with 100% oxygen Induction Type: IV induction, Rapid sequence and Cricoid Pressure applied Ventilation: Mask ventilation without difficulty Laryngoscope Size: Mac and 3 Grade View: Grade II Tube type: Oral Tube size: 7.0 mm Number of attempts: 1 Airway Equipment and Method: stylet Placement Confirmation: ETT inserted through vocal cords under direct vision,  positive ETCO2 and breath sounds checked- equal and bilateral Secured at: 22 cm Tube secured with: Tape Dental Injury: Teeth and Oropharynx as per pre-operative assessment

## 2016-12-29 NOTE — ED Notes (Signed)
Patient transported to CT 

## 2016-12-29 NOTE — Telephone Encounter (Signed)
Per Dr.Rehman - have patient to come to the ED. Patient's son,James was called and a message was left with Dr.Rehman's recommendation.

## 2016-12-29 NOTE — Progress Notes (Signed)
Brief ERCP note  Large hiatal hernia and food debris in the stomach. Biliary stent removed. Stent examination revealed it to be occluded. It is 7.5 [place]. 10 [place] 7 cm long plastic stent placement of bile duct for biliary decompression.

## 2016-12-29 NOTE — Progress Notes (Signed)
Pharmacy Antibiotic Note  Regina [name] is a 83 y.o. female admitted on 12/29/2016 with intar-abdominal infection .  Pharmacy has been consulted for Zosyn dosing  Plan: Zosyn 3.375g IV q8h (4 hour infusion).  F/U cxs and clinical progress Monitor V/S and labs  Height: 5\' 4"  (162.6 cm) Weight: 122 lb 2.2 oz (55.4 kg) IBW/kg (Calculated) : 54.7  Temp (24hrs), Avg:97.9 F (36.6 C), Min:97.4 F (36.3 C), Max:98.4 F (36.9 C)   Recent Labs Lab 12/29/16 1358  WBC 24.1*  CREATININE 1.43*    Estimated Creatinine Clearance: 21.7 mL/min (A) (by C-G formula based on SCr of 1.43 mg/dL (H)).    No Known Allergies  Antimicrobials this admission: Zosyn 7/27 >>   Dose adjustments this admission: N/A  Microbiology results: 7/27 BCx: pending 7/27 UCx: pending  Thank you for allowing pharmacy to be a part of this patient's care.  Isac Sarna, BS [place], [place] Clinical Pharmacist Pager [phone] 12/29/2016 8:49 PM

## 2016-12-29 NOTE — Anesthesia Postprocedure Evaluation (Signed)
Anesthesia Post Note  Patient: Regina [name]  Procedure(s) Performed: Procedure(s) (LRB): ENDOSCOPIC RETROGRADE CHOLANGIOPANCREATOGRAPHY (ERCP) WITH STENT EXCHANGE (N/A)  Patient location during evaluation: PACU Anesthesia Type: General Level of consciousness: awake and alert and patient cooperative Pain management: pain level controlled Vital Signs Assessment: post-procedure vital signs reviewed and stable Respiratory status: spontaneous breathing, nonlabored ventilation and respiratory function stable Cardiovascular status: blood pressure returned to baseline Postop Assessment: no signs of nausea or vomiting Anesthetic complications: no     Last Vitals:  Vitals:   12/29/16 1831 12/29/16 1845  BP: 100/66 (!) 116/49  Pulse: 77 69  Resp: 18 (!) 22  Temp:      Last Pain:  Vitals:   12/29/16 1801  TempSrc:   PainSc: 0-No pain                 [name]

## 2016-12-29 NOTE — ED Triage Notes (Signed)
Pt c/o all over abd pain and lwoer back pain. Pt had gallstones and put stent in this past June. Family states pt is having same sxs. Nausea. dneies v/d. Mild confusion that family states is not normal. Mild jaundice noted to sclera. Skin wnl in color

## 2016-12-29 NOTE — Telephone Encounter (Signed)
Patient's son had called the hospital about his mother. A concern that there may be a problem with the stent. I returned the patient's call and he shared the following with me.  Mother is feeling bad. She is having pain in back and stomach. She was having some chills but they have stopped. He took her temperature and she does not have a fever. She is complaining of nausea. Her color is good, no yellowing noted.  Jeneen Rinks, patient's son says that her symptoms now are like they were before stent placement a few months ago. Not as bad and no yellow skin.  He was advised that this would be addressed with Dr.Rehman and either myself or Dr.Rehman would call him back.   Hervey Ard [phone]

## 2016-12-29 NOTE — Progress Notes (Addendum)
Lactic acid called critical by lab at 3.5 at 2130. Trop was critical at 0.05 but value is improved.  MD notified via text page at 2135

## 2016-12-29 NOTE — Op Note (Signed)
[facility] Patient Name: Regina [name] Procedure Date: 12/29/2016 3:32 PM MRN: [id number] Date of Birth: 06-16-1922 Attending MD: Hildred Laser , MD CSN: [id number] Age: 83 Admit Type: Inpatient Procedure:                ERCP Indications:              For therapy of ascending cholangitis; Patient with                            retained stoneand biliary stent. Stent occlusion                            suspected Providers:                Hildred Laser, MD, Otis Peak B. Sharon Seller, RN, Bonnetta Barry, Technician Referring MD:             Jasper Loser. Luan Pulling MD, MD Medicines:                General Anesthesia Complications:            No immediate complications. Estimated Blood Loss:     Estimated blood loss: minimal. Procedure:                Pre-Anesthesia Assessment:                           - Prior to the procedure, a History and Physical                            was performed, and patient medications and                            allergies were reviewed. The patient's tolerance of                            previous anesthesia was also reviewed. The risks                            and benefits of the procedure and the sedation                            options and risks were discussed with the patient.                            All questions were answered, and informed consent                            was obtained. Prior Anticoagulants: The patient                            last took Eliquis (apixaban) on the day of the  procedure. ASA Grade Assessment: III - A patient                            with severe systemic disease. After reviewing the                            risks and benefits, the patient was deemed in                            satisfactory condition to undergo the procedure.                           After obtaining informed consent, the scope was                            passed under direct vision.  Throughout the                            procedure, the patient's blood pressure, pulse, and                            oxygen saturations were monitored continuously. The                            [id number]) scope was introduced through                            the and used to inject contrast into and used to                            inject contrast into the bile duct. The ERCP was                            somewhat difficult due to challenging cannulation                            because of abnormal anatomy(large hiatal hernia).                            The patient tolerated the procedure well. Findings:      A biliary stent was visible on the scout film. The esophagus was       successfully intubated under direct vision. The scope was advanced to a       normal major papilla in the descending duodenum without detailed       examination of the pharynx, larynx and associated structures, and upper       GI tract. large hiatal hernia noted.. The bile duct was deeply       cannulated with the Autotome sphincterotome. Contrast was injected. I       personally interpreted the bile duct images. Ductal flow of contrast was       adequate. Image quality was adequate. The common bile duct and common       hepatic duct were moderately dilated and diffusely dilated, with a stone  causing an obstruction. One stent was removed from the biliary tree       using a snare. The stent was found to be occluded. One 10 Fr by 7 cm       plastic stent with two internal flaps was placed 6 cm into the common       bile duct. Fluid flowed through the stent. The stent was in good       position. Impression:               - The common bile duct and common hepatic duct were                            moderately dilated.                           - One stent was removed from the biliary                            tree(occluded 7.5 French stent)                           - One plastic stent  was placed into the common bile                            duct flow of contrast and mucopurulent material.                           - Stone retrieval not attempted as patient is                            anticoagulated.                           Comment: please note that contrast was injected                            only to confirm position but full cholangiogram was                            not performed. Moderate Sedation:      Per Anesthesia Care Recommendation:           - Return patient to ICU for ongoing care.                           - Clear liquid diet today.                           - Avoid aspirin and nonsteroidal anti-inflammatory                            medicines for 1 day.                           - Repeat ERCP in 3 months. Procedure Code(s):        --- Professional ---  [phone], Endoscopic retrograde                            cholangiopancreatography (ERCP); with removal and                            exchange of stent(s), biliary or pancreatic duct,                            including pre- and post-dilation and guide wire                            passage, when performed, including sphincterotomy,                            when performed, each stent exchanged Diagnosis Code(s):        --- Professional ---                           K80.31, Calculus of bile duct with cholangitis,                            unspecified, with obstruction                           Z46.59, Encounter for fitting and adjustment of                            other gastrointestinal appliance and device                           K83.0, Cholangitis CPT copyright 2016 [employer]. All rights reserved. The codes documented in this report are preliminary and upon coder review may  be revised to meet current compliance requirements. Hildred Laser, MD Hildred Laser, MD 12/29/2016 6:45:07 PM This report has been signed electronically. Number of  Addenda: 0

## 2016-12-29 NOTE — Anesthesia Preprocedure Evaluation (Addendum)
Anesthesia Evaluation  Patient identified by MRN, date of birth, ID band Patient awake    Reviewed: Allergy & Precautions, NPO status , Patient's Chart, lab work & pertinent test results, reviewed documented beta blocker date and time   Airway Mallampati: II  TM Distance: >3 FB Neck ROM: Full    Dental  (+) Poor Dentition, Partial Lower, Missing   Pulmonary           Cardiovascular Exercise Tolerance: Poor hypertension, Pt. on home beta blockers + dysrhythmias Atrial Fibrillation  Rhythm:Irregular Rate:Normal     Neuro/Psych CVA, No Residual Symptoms    GI/Hepatic hiatal hernia,   Endo/Other  Hypothyroidism   Renal/GU      Musculoskeletal  (+) Arthritis ,   Abdominal (+)  Abdomen: soft. Bowel sounds: normal.  Peds  Hematology  (+) anemia ,   Anesthesia Other Findings   Reproductive/Obstetrics                            Anesthesia Physical Anesthesia Plan  ASA: III and emergent  Anesthesia Plan: General   Post-op Pain Management:    Induction: Intravenous, Rapid sequence and Cricoid pressure planned  PONV Risk Score and Plan: 2 and Ondansetron  Airway Management Planned: Oral ETT  Additional Equipment:   Intra-op Plan:   Post-operative Plan: Extubation in OR  Informed Consent:   Plan Discussed with:   Anesthesia Plan Comments:         Anesthesia Quick Evaluation

## 2016-12-30 ENCOUNTER — Encounter (HOSPITAL_COMMUNITY): Payer: Self-pay | Admitting: Gastroenterology

## 2016-12-30 DIAGNOSIS — K830 Cholangitis: Secondary | ICD-10-CM

## 2016-12-30 DIAGNOSIS — K8051 Calculus of bile duct without cholangitis or cholecystitis with obstruction: Secondary | ICD-10-CM

## 2016-12-30 LAB — COMPREHENSIVE METABOLIC PANEL
ALBUMIN: 2.6 g/dL — AB (ref 3.5–5.0)
ALK PHOS: 82 U/L (ref 38–126)
ALT: 94 U/L — AB (ref 14–54)
ANION GAP: 7 (ref 5–15)
AST: 93 U/L — ABNORMAL HIGH (ref 15–41)
BUN: 23 mg/dL — ABNORMAL HIGH (ref 6–20)
CALCIUM: 7.7 mg/dL — AB (ref 8.9–10.3)
CHLORIDE: 104 mmol/L (ref 101–111)
CO2: 25 mmol/L (ref 22–32)
CREATININE: 1.31 mg/dL — AB (ref 0.44–1.00)
GFR calc Af Amer: 40 mL/min — ABNORMAL LOW (ref >60)
GFR calc non Af Amer: 34 mL/min — ABNORMAL LOW (ref >60)
GLUCOSE: 94 mg/dL (ref 65–99)
Potassium: 4.2 mmol/L (ref 3.5–5.1)
SODIUM: 136 mmol/L (ref 135–145)
Total Bilirubin: 3.6 mg/dL — ABNORMAL HIGH (ref 0.3–1.2)
Total Protein: 5.7 g/dL — ABNORMAL LOW (ref 6.5–8.1)

## 2016-12-30 LAB — CBC
HEMATOCRIT: 37 % (ref 36.0–46.0)
Hemoglobin: 12 g/dL (ref 12.0–15.0)
MCH: 29.8 pg (ref 26.0–34.0)
MCHC: 32.4 g/dL (ref 30.0–36.0)
MCV: 91.8 fL (ref 78.0–100.0)
PLATELETS: 131 10*3/uL — AB (ref 150–400)
RBC: 4.03 MIL/uL (ref 3.87–5.11)
RDW: 16.1 % — ABNORMAL HIGH (ref 11.5–15.5)
WBC: 19.6 10*3/uL — AB (ref 4.0–10.5)

## 2016-12-30 LAB — BLOOD CULTURE ID PANEL (REFLEXED)
ACINETOBACTER BAUMANNII: NOT DETECTED
CANDIDA ALBICANS: NOT DETECTED
CANDIDA GLABRATA: NOT DETECTED
CANDIDA KRUSEI: NOT DETECTED
CANDIDA PARAPSILOSIS: NOT DETECTED
CANDIDA TROPICALIS: NOT DETECTED
Carbapenem resistance: NOT DETECTED
ENTEROBACTER CLOACAE COMPLEX: NOT DETECTED
Enterobacteriaceae species: DETECTED — AB
Enterococcus species: NOT DETECTED
Escherichia coli: DETECTED — AB
HAEMOPHILUS INFLUENZAE: NOT DETECTED
KLEBSIELLA OXYTOCA: NOT DETECTED
KLEBSIELLA PNEUMONIAE: NOT DETECTED
Listeria monocytogenes: NOT DETECTED
Neisseria meningitidis: NOT DETECTED
PROTEUS SPECIES: NOT DETECTED
PSEUDOMONAS AERUGINOSA: NOT DETECTED
STREPTOCOCCUS PYOGENES: NOT DETECTED
STREPTOCOCCUS SPECIES: NOT DETECTED
Serratia marcescens: NOT DETECTED
Staphylococcus aureus (BCID): NOT DETECTED
Staphylococcus species: NOT DETECTED
Streptococcus agalactiae: NOT DETECTED
Streptococcus pneumoniae: NOT DETECTED

## 2016-12-30 LAB — LACTIC ACID, PLASMA: LACTIC ACID, VENOUS: 2.2 mmol/L — AB (ref 0.5–1.9)

## 2016-12-30 LAB — TROPONIN I
TROPONIN I: 0.04 ng/mL — AB (ref <0.03)
TROPONIN I: 0.05 ng/mL — AB (ref <0.03)

## 2016-12-30 MED ORDER — PANTOPRAZOLE SODIUM 40 MG PO TBEC
40.0000 mg | DELAYED_RELEASE_TABLET | Freq: Every day | ORAL | Status: DC
  Administered 2016-12-30: 40 mg via ORAL
  Filled 2016-12-30: qty 1

## 2016-12-30 MED ORDER — LEVALBUTEROL HCL 0.63 MG/3ML IN NEBU
0.6300 mg | INHALATION_SOLUTION | Freq: Three times a day (TID) | RESPIRATORY_TRACT | Status: DC
  Filled 2016-12-30: qty 3

## 2016-12-30 MED ORDER — LEVALBUTEROL HCL 0.63 MG/3ML IN NEBU: 0.6300 mg | INHALATION_SOLUTION | Freq: Three times a day (TID) | RESPIRATORY_TRACT | Status: DC | PRN

## 2016-12-30 NOTE — Progress Notes (Signed)
Subjective: She says she feels much better. She was admitted yesterday with stent occlusion in the common bile duct. This is been replaced. She feels better. She said that she started feeling to full when she was getting her Jell-O at breakfast so she stopped eating it. She's not really nauseated she just did not want to get nauseated or have more pain. She says she feels substantially better. She had elevated white blood cell count on admission and there was concern that she might be septic. She had elevated lactate. She also has elevated troponin but she's not really having any chest pain.  Objective: Vital signs in last 24 hours: Temp:  [97.3 F (36.3 C)-98.4 F (36.9 C)] 97.5 F (36.4 C) (07/28 0800) Pulse Rate:  [55-94] 69 (07/28 0700) Resp:  [16-27] 21 (07/28 0700) BP: (86-133)/(47-102) 117/102 (07/28 0700) SpO2:  [92 %-100 %] 100 % (07/28 0700) Weight:  [55.4 kg (122 lb 2.2 oz)] 55.4 kg (122 lb 2.2 oz) (07/27 2015) Weight change:  Last BM Date: 12/29/16  Intake/Output from previous day: 07/27 0701 - 07/28 0700 In: 3885 [P.O.:200; I.V.:3585; IV Piggyback:100] Out: -   PHYSICAL EXAM General appearance: alert, cooperative and no distress Resp: clear to auscultation bilaterally Cardio: irregularly irregular rhythm GI: soft, non-tender; bowel sounds normal; no masses,  no organomegaly Extremities: extremities normal, atraumatic, no cyanosis or edema Skin warm and dry  Lab Results:  Results for orders placed or performed during the hospital encounter of 12/29/16 (from the past 48 hour(s))  Protime-INR     Status: Abnormal   Collection Time: 12/29/16  1:35 PM  Result Value Ref Range   Prothrombin Time 16.4 (H) 11.4 - 15.2 seconds   INR 1.32   Comprehensive metabolic panel     Status: Abnormal   Collection Time: 12/29/16  1:58 PM  Result Value Ref Range   Sodium 139 135 - 145 mmol/L   Potassium 4.1 3.5 - 5.1 mmol/L   Chloride 103 101 - 111 mmol/L   CO2 22 22 - 32 mmol/L   Glucose, Bld 175 (H) 65 - 99 mg/dL   BUN 18 6 - 20 mg/dL   Creatinine, Ser 1.43 (H) 0.44 - 1.00 mg/dL   Calcium 9.3 8.9 - 10.3 mg/dL   Total Protein 7.2 6.5 - 8.1 g/dL   Albumin 3.2 (L) 3.5 - 5.0 g/dL   AST 173 (H) 15 - 41 U/L   ALT 139 (H) 14 - 54 U/L   Alkaline Phosphatase 125 38 - 126 U/L   Total Bilirubin 4.5 (H) 0.3 - 1.2 mg/dL   GFR calc non Af Amer 31 (L) >60 mL/min   GFR calc Af Amer 36 (L) >60 mL/min    Comment: (NOTE) The eGFR has been calculated using the CKD EPI equation. This calculation has not been validated in all clinical situations. eGFR's persistently <60 mL/min signify possible Chronic Kidney Disease.    Anion gap 14 5 - 15  Lipase, blood     Status: None   Collection Time: 12/29/16  1:58 PM  Result Value Ref Range   Lipase 35 11 - 51 U/L  CBC with Differential     Status: Abnormal   Collection Time: 12/29/16  1:58 PM  Result Value Ref Range   WBC 24.1 (H) 4.0 - 10.5 K/uL   RBC 4.62 3.87 - 5.11 MIL/uL   Hemoglobin 13.7 12.0 - 15.0 g/dL   HCT 41.9 36.0 - 46.0 %   MCV 90.7 78.0 - 100.0 fL  MCH 29.7 26.0 - 34.0 pg   MCHC 32.7 30.0 - 36.0 g/dL   RDW 15.8 (H) 11.5 - 15.5 %   Platelets 159 150 - 400 K/uL   Neutrophils Relative % 92 %   Neutro Abs 22.1 (H) 1.7 - 7.7 K/uL   Lymphocytes Relative 2 %   Lymphs Abs 0.4 (L) 0.7 - 4.0 K/uL   Monocytes Relative 6 %   Monocytes Absolute 1.5 (H) 0.1 - 1.0 K/uL   Eosinophils Relative 0 %   Eosinophils Absolute 0.0 0.0 - 0.7 K/uL   Basophils Relative 0 %   Basophils Absolute 0.0 0.0 - 0.1 K/uL  Troponin I     Status: Abnormal   Collection Time: 12/29/16  1:58 PM  Result Value Ref Range   Troponin I 0.06 (HH) <0.03 ng/mL    Comment: CRITICAL RESULT CALLED TO, READ BACK BY AND VERIFIED WITH: HASKINS,K ON 12/29/16 AT 1550 BY LOY,C   Ammonia     Status: None   Collection Time: 12/29/16  1:58 PM  Result Value Ref Range   Ammonia 29 9 - 35 umol/L  Culture, blood (routine x 2)     Status: None (Preliminary result)    Collection Time: 12/29/16  3:07 PM  Result Value Ref Range   Specimen Description LEFT ANTECUBITAL    Special Requests Blood Culture adequate volume    Culture NO GROWTH < 24 HOURS    Report Status PENDING   Culture, blood (routine x 2)     Status: None (Preliminary result)   Collection Time: 12/29/16  3:10 PM  Result Value Ref Range   Specimen Description LEFT ANTECUBITAL    Special Requests Blood Culture adequate volume    Culture  Setup Time      GRAM NEGATIVE RODS AEROBIC AND ANAEROBIC BOTTLES Gram Stain Report Called to,Read Back By and Verified With: HILTON,L '@0830'  BY MATTHEWS,B 12/30/16    Culture NO GROWTH < 24 HOURS    Report Status PENDING   Urinalysis, Routine w reflex microscopic     Status: Abnormal   Collection Time: 12/29/16  3:26 PM  Result Value Ref Range   Color, Urine AMBER (A) YELLOW    Comment: BIOCHEMICALS MAY BE AFFECTED BY COLOR   APPearance HAZY (A) CLEAR   Specific Gravity, Urine 1.021 1.005 - 1.030   pH 5.0 5.0 - 8.0   Glucose, UA NEGATIVE NEGATIVE mg/dL   Hgb urine dipstick SMALL (A) NEGATIVE   Bilirubin Urine MODERATE (A) NEGATIVE   Ketones, ur NEGATIVE NEGATIVE mg/dL   Protein, ur 100 (A) NEGATIVE mg/dL   Nitrite NEGATIVE NEGATIVE   Leukocytes, UA NEGATIVE NEGATIVE   RBC / HPF 0-5 0 - 5 RBC/hpf   WBC, UA 6-30 0 - 5 WBC/hpf   Bacteria, UA RARE (A) NONE SEEN   Squamous Epithelial / LPF 0-5 (A) NONE SEEN   Mucous PRESENT    Hyaline Casts, UA PRESENT    Non Squamous Epithelial 0-5 (A) NONE SEEN  Lactic acid, plasma     Status: Abnormal   Collection Time: 12/29/16  8:12 PM  Result Value Ref Range   Lactic Acid, Venous 3.5 (HH) 0.5 - 1.9 mmol/L    Comment: CRITICAL RESULT CALLED TO, READ BACK BY AND VERIFIED WITH: WAGONER,R ON 12/29/16 AT 2130 BY LOY,C   Troponin I (q 6hr x 3)     Status: Abnormal   Collection Time: 12/29/16  8:12 PM  Result Value Ref Range   Troponin I 0.05 (HH) <  0.03 ng/mL    Comment: CRITICAL VALUE NOTED.  VALUE IS  CONSISTENT WITH PREVIOUSLY REPORTED AND CALLED VALUE.  MRSA PCR Screening     Status: None   Collection Time: 12/29/16  8:23 PM  Result Value Ref Range   MRSA by PCR NEGATIVE NEGATIVE    Comment:        The GeneXpert MRSA Assay (FDA approved for NASAL specimens only), is one component of a comprehensive MRSA colonization surveillance program. It is not intended to diagnose MRSA infection nor to guide or monitor treatment for MRSA infections.   Troponin I (q 6hr x 3)     Status: Abnormal   Collection Time: 12/30/16 12:59 AM  Result Value Ref Range   Troponin I 0.04 (HH) <0.03 ng/mL    Comment: CRITICAL VALUE NOTED.  VALUE IS CONSISTENT WITH PREVIOUSLY REPORTED AND CALLED VALUE.  Lactic acid, plasma     Status: Abnormal   Collection Time: 12/30/16 12:59 AM  Result Value Ref Range   Lactic Acid, Venous 2.2 (HH) 0.5 - 1.9 mmol/L    Comment: CRITICAL RESULT CALLED TO, READ BACK BY AND VERIFIED WITH:  HOWARD,C @ 0204 ON 12/30/16 BY JUW   Troponin I (q 6hr x 3)     Status: Abnormal   Collection Time: 12/30/16  6:30 AM  Result Value Ref Range   Troponin I 0.05 (HH) <0.03 ng/mL    Comment: CRITICAL RESULT CALLED TO, READ BACK BY AND VERIFIED WITH: DANIELS,J @ 0744 BY MATTHEWS,B 12/30/16   CBC     Status: Abnormal   Collection Time: 12/30/16  6:30 AM  Result Value Ref Range   WBC 19.6 (H) 4.0 - 10.5 K/uL   RBC 4.03 3.87 - 5.11 MIL/uL   Hemoglobin 12.0 12.0 - 15.0 g/dL   HCT 37.0 36.0 - 46.0 %   MCV 91.8 78.0 - 100.0 fL   MCH 29.8 26.0 - 34.0 pg   MCHC 32.4 30.0 - 36.0 g/dL   RDW 16.1 (H) 11.5 - 15.5 %   Platelets 131 (L) 150 - 400 K/uL  Comprehensive metabolic panel     Status: Abnormal   Collection Time: 12/30/16  6:30 AM  Result Value Ref Range   Sodium 136 135 - 145 mmol/L   Potassium 4.2 3.5 - 5.1 mmol/L   Chloride 104 101 - 111 mmol/L   CO2 25 22 - 32 mmol/L   Glucose, Bld 94 65 - 99 mg/dL   BUN 23 (H) 6 - 20 mg/dL   Creatinine, Ser 1.31 (H) 0.44 - 1.00 mg/dL    Calcium 7.7 (L) 8.9 - 10.3 mg/dL   Total Protein 5.7 (L) 6.5 - 8.1 g/dL   Albumin 2.6 (L) 3.5 - 5.0 g/dL   AST 93 (H) 15 - 41 U/L   ALT 94 (H) 14 - 54 U/L   Alkaline Phosphatase 82 38 - 126 U/L   Total Bilirubin 3.6 (H) 0.3 - 1.2 mg/dL   GFR calc non Af Amer 34 (L) >60 mL/min   GFR calc Af Amer 40 (L) >60 mL/min    Comment: (NOTE) The eGFR has been calculated using the CKD EPI equation. This calculation has not been validated in all clinical situations. eGFR's persistently <60 mL/min signify possible Chronic Kidney Disease.    Anion gap 7 5 - 15    ABGS No results for input(s): PHART, PO2ART, TCO2, HCO3 in the last 72 hours.  Invalid input(s): PCO2 CULTURES Recent Results (from the past 240 hour(s))  Culture,  blood (routine x 2)     Status: None (Preliminary result)   Collection Time: 12/29/16  3:07 PM  Result Value Ref Range Status   Specimen Description LEFT ANTECUBITAL  Final   Special Requests Blood Culture adequate volume  Final   Culture NO GROWTH < 24 HOURS  Final   Report Status PENDING  Incomplete  Culture, blood (routine x 2)     Status: None (Preliminary result)   Collection Time: 12/29/16  3:10 PM  Result Value Ref Range Status   Specimen Description LEFT ANTECUBITAL  Final   Special Requests Blood Culture adequate volume  Final   Culture  Setup Time   Final    GRAM NEGATIVE RODS AEROBIC AND ANAEROBIC BOTTLES Gram Stain Report Called to,Read Back By and Verified With: HILTON,L '@0830'  BY MATTHEWS,B 12/30/16    Culture NO GROWTH < 24 HOURS  Final   Report Status PENDING  Incomplete  MRSA PCR Screening     Status: None   Collection Time: 12/29/16  8:23 PM  Result Value Ref Range Status   MRSA by PCR NEGATIVE NEGATIVE Final    Comment:        The GeneXpert MRSA Assay (FDA approved for NASAL specimens only), is one component of a comprehensive MRSA colonization surveillance program. It is not intended to diagnose MRSA infection nor to guide or monitor  treatment for MRSA infections.    Studies/Results: Dg Chest 1 View  Result Date: 12/29/2016 CLINICAL DATA:  Elevated white count, abdomen pain with nausea EXAM: CHEST 1 VIEW COMPARISON:  11/10/2016, MRI 11/11/2016 FINDINGS: Cardiomegaly. Unable to rule out atelectasis or infiltrate at the left lung base. Aortic atherosclerosis. Large hiatal hernia. IMPRESSION: 1. Large hiatal hernia 2. Cardiomegaly with mild central congestion 3. Unable to rule out atelectasis or infiltrate at the left lung base. Electronically Signed   By: Donavan Foil M.D.   On: 12/29/2016 15:03   Dg Ercp Biliary & Pancreatic Ducts  Result Date: 12/30/2016 CLINICAL DATA:  ERCP, BILIARY OBSTRUCTION, HISTORY OF STROKE, HTN, PREVIOUS ERCP 2017 Pt c/o all over abd pain and lwoer back pain. Pt had gallstones and put stent in this past June EXAM: ERCP TECHNIQUE: Multiple spot images obtained with the fluoroscopic device and submitted for interpretation post-procedure. COMPARISON:  11/11/2016 FINDINGS: A series of 12 fluoroscopic spot images document endoscopic cannulation and partial opacification of the CBD with placement of a plastic biliary stent. The intrahepatic biliary tree is not significantly opacified. IMPRESSION: 1. CBD stent placement These images were submitted for radiologic interpretation only. Please see the procedural report for the amount of contrast and the fluoroscopy time utilized. Electronically Signed   By: Lucrezia Europe M.D.   On: 12/30/2016 08:38   Dg Abd Portable 1v  Result Date: 12/29/2016 CLINICAL DATA:  Abdominal pain and elevated white blood cell count. History of biliary stent. Evaluate biliary stent prior to ERCP. EXAM: PORTABLE ABDOMEN - 1 VIEW COMPARISON:  Chest radiograph - 11/10/2016; ERCP - 11/11/2016 FINDINGS: The patient is rotated to the left. A plastic biliary stent overlies the right upper abdominal quadrant. Moderate colonic stool burden without evidence of enteric obstruction. Nondiagnostic  evaluation for pneumoperitoneum secondary supine positioning and exclusion of the lower thorax. No pneumatosis or portal venous gas. Atherosclerotic plaque within the abdominal aorta. Post left total hip replacement, incompletely evaluated. IMPRESSION: 1. Plastic biliary stent overlies the right upper abdominal quadrant, likely overlying the caudal aspect of common bile duct given patient rotation. 2.  Aortic Atherosclerosis (ICD10-I70.0).  Electronically Signed   By: Sandi Mariscal M.D.   On: 12/29/2016 16:08    Medications:  Prior to Admission:  Prescriptions Prior to Admission  Medication Sig Dispense Refill Last Dose  . apixaban (ELIQUIS) 2.5 MG TABS tablet Take 1 tablet (2.5 mg total) by mouth 2 (two) times daily. 60 tablet 6 12/29/2016 at 0800  . clotrimazole-betamethasone (LOTRISONE) cream Apply 1 application topically 2 (two) times daily as needed (rash).   1 Past Week at Unknown time  . acetaminophen (TYLENOL) 325 MG tablet Take 325 mg by mouth every 6 (six) hours as needed for headache (pain).   More than a month at Unknown time  . diltiazem (CARDIZEM CD) 120 MG 24 hr capsule take 1 capsule by mouth once daily 90 capsule 2 12/29/2016 at 0800  . levothyroxine (SYNTHROID, LEVOTHROID) 100 MCG tablet Take 100 mcg by mouth daily.    12/29/2016 at 0800  . metoprolol tartrate (LOPRESSOR) 25 MG tablet take 1 tablet by mouth twice a day 180 tablet 3 12/29/2016 at 0800  . metoprolol tartrate (LOPRESSOR) 25 MG tablet take 1 tablet twice a day 60 tablet 0 Taking  . metoprolol tartrate (LOPRESSOR) 25 MG tablet take 1 tablet twice a day 180 tablet 0   . pantoprazole (PROTONIX) 40 MG tablet Take 40 mg by mouth daily.  1 Taking  . ursodiol (ACTIGALL) 250 MG tablet Take 1 tablet (250 mg total) by mouth 2 (two) times daily. 60 tablet 5    Scheduled: . diltiazem  120 mg Oral Daily  . levothyroxine  100 mcg Oral QAC breakfast  . pantoprazole (PROTONIX) IV  40 mg Intravenous Q24H   Continuous: . sodium chloride  100 mL/hr at 12/29/16 2227  . piperacillin-tazobactam (ZOSYN)  IV 3.375 g (12/30/16 0500)  . sodium chloride    . sodium chloride     BOF:BPZWCHENIDPOE **OR** acetaminophen, HYDROmorphone (DILAUDID) injection, ondansetron **OR** ondansetron (ZOFRAN) IV  Assesment: She was admitted with biliary disease with occlusion of biliary stent. She appeared to be septic with elevated white blood cell count. Her white blood cell count has come down a little bit but is not normal now. Lactate was better at 2.2 early this morning. Liver function testing looks better. She feels better. She has elevated troponin but no evidence of acute coronary syndrome. At baseline she has atrial fib and is chronically anticoagulated. Principal Problem:   Sepsis ([place]) Active Problems:   Atrial fibrillation ([facility])   Jaundice   Choledochitis   Calculus of common bile duct with obstruction   Elevated troponin    Plan: Continue treatments. If she gets to feeling better we can advance her diet. Repeat blood testing in the morning    LOS: 1 day   Tzirel Leonor L 12/30/2016, 9:27 AM

## 2016-12-30 NOTE — Progress Notes (Addendum)
Patient ID: Regina [name], female   DOB: [birth year], 83 y.o.   MRN: [id number]   Assessment/Plan: ADMITTED WITH CHOLANGITIS. S/P ERCP STENT EXCHANGE AND A STONE REMAINS IN HER BILE DUCT. END EXPIRATORY WHEEZES ON EXAM & LIKELY DUE TO MILD PULMONARY EDEMA. AFEBRILE LIVER ENZYMES IMPROVED.  PLAN: 1. ADVANCE DIET. COULD CHANGE ZOSYN TO AUGMENTIN 500 MG BID TO COMPLETE 7 DAY COURSE. 2. XOPENEX Q8H. D/C MIVFs. CONSIDER LASIX. 3. PROTONIX DAILY. 4. REPEAT ERCP WITH STENT EXCHANGE AND STONE EXTRACTION IN 3 MOS WITH DR. Laural Golden.   Subjective: Since PT last evaluated the patient SHE DENIES NAUSEA/VOMITING/EPIGASTRIC PAIN. WOULD LIKE TO ADVANCE DIET. FEEL MUCH BETTER THAN YESTERDAY.   Objective: Vital signs in last 24 hours: Vitals:   12/30/16 0800 12/30/16 0900  BP:  (!) 100/54  Pulse: 66 66  Resp: (!) 25 (!) 24  Temp: (!) 97.5 F (36.4 C)    General appearance: alert, cooperative and no distress, AUDIBLE WHEEZES BUT COMPLETES FULL SENTENCES. Resp: wheezes bilaterally IN ALL LUNG Lamesha Tibbits, GOOD AIR MOVEMENT. Cardio: regular rate and IRREGULAR rhythm GI: soft, non-tender; bowel sounds normal;   Lab Results:  Cr 1.31 K 4.2 T BILI 3.6 ALT 94 AST 93 ALB 2.6 ALK PHOS 82  Studies/Results: Dg Chest 1 View  Result Date: 12/29/2016 CLINICAL DATA:  Elevated white count, abdomen pain with nausea EXAM: CHEST 1 VIEW COMPARISON:  11/10/2016, MRI 11/11/2016 FINDINGS: Cardiomegaly. Unable to rule out atelectasis or infiltrate at the left lung base. Aortic atherosclerosis. Large hiatal hernia. IMPRESSION: 1. Large hiatal hernia 2. Cardiomegaly with mild central congestion 3. Unable to rule out atelectasis or infiltrate at the left lung base. Electronically Signed   By: Donavan Foil M.D.   On: 12/29/2016 15:03   Dg Ercp Biliary & Pancreatic Ducts  Result Date: 12/30/2016 CLINICAL DATA:  ERCP, BILIARY OBSTRUCTION, HISTORY OF STROKE, HTN, PREVIOUS ERCP 2017 Pt c/o all over abd pain and lwoer back pain.  Pt had gallstones and put stent in this past June EXAM: ERCP TECHNIQUE: Multiple spot images obtained with the fluoroscopic device and submitted for interpretation post-procedure. COMPARISON:  11/11/2016 FINDINGS: A series of 12 fluoroscopic spot images document endoscopic cannulation and partial opacification of the CBD with placement of a plastic biliary stent. The intrahepatic biliary tree is not significantly opacified. IMPRESSION: 1. CBD stent placement These images were submitted for radiologic interpretation only. Please see the procedural report for the amount of contrast and the fluoroscopy time utilized. Electronically Signed   By: Lucrezia Europe M.D.   On: 12/30/2016 08:38   Dg Abd Portable 1v  Result Date: 12/29/2016 CLINICAL DATA:  Abdominal pain and elevated white blood cell count. History of biliary stent. Evaluate biliary stent prior to ERCP. EXAM: PORTABLE ABDOMEN - 1 VIEW COMPARISON:  Chest radiograph - 11/10/2016; ERCP - 11/11/2016 FINDINGS: The patient is rotated to the left. A plastic biliary stent overlies the right upper abdominal quadrant. Moderate colonic stool burden without evidence of enteric obstruction. Nondiagnostic evaluation for pneumoperitoneum secondary supine positioning and exclusion of the lower thorax. No pneumatosis or portal venous gas. Atherosclerotic plaque within the abdominal aorta. Post left total hip replacement, incompletely evaluated. IMPRESSION: 1. Plastic biliary stent overlies the right upper abdominal quadrant, likely overlying the caudal aspect of common bile duct given patient rotation. 2.  Aortic Atherosclerosis (ICD10-I70.0). Electronically Signed   By: Asmara Backs Mariscal M.D.   On: 12/29/2016 16:08    Medications: I have reviewed the patient's current medications.   LOS: 5  days   [employer] 11/13/2013, 2:23 PM

## 2016-12-30 NOTE — Anesthesia Postprocedure Evaluation (Signed)
Anesthesia Post Note  Patient: Lianny A Kibbe  Procedure(s) Performed: Procedure(s) (LRB): ENDOSCOPIC RETROGRADE CHOLANGIOPANCREATOGRAPHY (ERCP) WITH STENT EXCHANGE (N/A)  Patient location during evaluation: ICU Anesthesia Type: General Level of consciousness: awake and alert, oriented and patient cooperative Pain management: pain level controlled Vital Signs Assessment: post-procedure vital signs reviewed and stable Respiratory status: spontaneous breathing, nonlabored ventilation and respiratory function stable Cardiovascular status: blood pressure returned to baseline Postop Assessment: no signs of nausea or vomiting Anesthetic complications: no     Last Vitals:  Vitals:   12/30/16 0800 12/30/16 0900  BP:  (!) 100/54  Pulse: 66 66  Resp: (!) 25 (!) 24  Temp: (!) 36.4 C     Last Pain:  Vitals:   12/30/16 0800  TempSrc: Oral  PainSc:                  Jahfari Ambers J

## 2016-12-30 NOTE — Progress Notes (Signed)
Critical trop 0.04 and lactic acid 2.2 have both improved.

## 2016-12-30 NOTE — Addendum Note (Signed)
Addendum  created 12/30/16 0935 by Berwyn Bigley J, CRNA   Sign clinical note    

## 2016-12-30 NOTE — Progress Notes (Signed)
PHARMACY - PHYSICIAN COMMUNICATION CRITICAL VALUE ALERT - BLOOD CULTURE IDENTIFICATION (BCID)  Results for orders placed or performed during the hospital encounter of 12/29/16  Blood Culture ID Panel (Reflexed) (Collected: 12/29/2016  3:10 PM)  Result Value Ref Range   Enterococcus species NOT DETECTED NOT DETECTED   Listeria monocytogenes NOT DETECTED NOT DETECTED   Staphylococcus species NOT DETECTED NOT DETECTED   Staphylococcus aureus NOT DETECTED NOT DETECTED   Streptococcus species NOT DETECTED NOT DETECTED   Streptococcus agalactiae NOT DETECTED NOT DETECTED   Streptococcus pneumoniae NOT DETECTED NOT DETECTED   Streptococcus pyogenes NOT DETECTED NOT DETECTED   Acinetobacter baumannii NOT DETECTED NOT DETECTED   Enterobacteriaceae species DETECTED (A) NOT DETECTED   Enterobacter cloacae complex NOT DETECTED NOT DETECTED   Escherichia coli DETECTED (A) NOT DETECTED   Klebsiella oxytoca NOT DETECTED NOT DETECTED   Klebsiella pneumoniae NOT DETECTED NOT DETECTED   Proteus species NOT DETECTED NOT DETECTED   Serratia marcescens NOT DETECTED NOT DETECTED   Carbapenem resistance NOT DETECTED NOT DETECTED   Haemophilus influenzae NOT DETECTED NOT DETECTED   Neisseria meningitidis NOT DETECTED NOT DETECTED   Pseudomonas aeruginosa NOT DETECTED NOT DETECTED   Candida albicans NOT DETECTED NOT DETECTED   Candida glabrata NOT DETECTED NOT DETECTED   Candida krusei NOT DETECTED NOT DETECTED   Candida parapsilosis NOT DETECTED NOT DETECTED   Candida tropicalis NOT DETECTED NOT DETECTED    Name of physician (or Provider) Contacted: Dr. Luan Pulling  Changes to prescribed antibiotics required: Continue Zosyn as prescribed. F/U cxs and sensitivities  Isac Sarna, BS [place], [place] Clinical Pharmacist Pager [phone] 12/30/2016  1:11 PM

## 2016-12-31 ENCOUNTER — Telehealth: Payer: Self-pay | Admitting: Gastroenterology

## 2016-12-31 LAB — COMPREHENSIVE METABOLIC PANEL
ALBUMIN: 2.7 g/dL — AB (ref 3.5–5.0)
ALT: 69 U/L — ABNORMAL HIGH (ref 14–54)
AST: 54 U/L — AB (ref 15–41)
Alkaline Phosphatase: 69 U/L (ref 38–126)
Anion gap: 8 (ref 5–15)
BUN: 22 mg/dL — AB (ref 6–20)
CALCIUM: 8 mg/dL — AB (ref 8.9–10.3)
CHLORIDE: 108 mmol/L (ref 101–111)
CO2: 24 mmol/L (ref 22–32)
Creatinine, Ser: 1.36 mg/dL — ABNORMAL HIGH (ref 0.44–1.00)
GFR calc Af Amer: 38 mL/min — ABNORMAL LOW (ref >60)
GFR calc non Af Amer: 33 mL/min — ABNORMAL LOW (ref >60)
GLUCOSE: 78 mg/dL (ref 65–99)
POTASSIUM: 3.9 mmol/L (ref 3.5–5.1)
Sodium: 140 mmol/L (ref 135–145)
TOTAL PROTEIN: 5.8 g/dL — AB (ref 6.5–8.1)
Total Bilirubin: 1.9 mg/dL — ABNORMAL HIGH (ref 0.3–1.2)

## 2016-12-31 LAB — CBC WITH DIFFERENTIAL/PLATELET
BASOS ABS: 0 10*3/uL (ref 0.0–0.1)
BASOS PCT: 0 %
Eosinophils Absolute: 0.2 10*3/uL (ref 0.0–0.7)
Eosinophils Relative: 2 %
HCT: 36.8 % (ref 36.0–46.0)
Hemoglobin: 11.8 g/dL — ABNORMAL LOW (ref 12.0–15.0)
LYMPHS PCT: 7 %
Lymphs Abs: 0.9 10*3/uL (ref 0.7–4.0)
MCH: 29.3 pg (ref 26.0–34.0)
MCHC: 32.1 g/dL (ref 30.0–36.0)
MCV: 91.3 fL (ref 78.0–100.0)
MONO ABS: 0.7 10*3/uL (ref 0.1–1.0)
Monocytes Relative: 6 %
NEUTROS ABS: 11.3 10*3/uL — AB (ref 1.7–7.7)
Neutrophils Relative %: 85 %
PLATELETS: 134 10*3/uL — AB (ref 150–400)
RBC: 4.03 MIL/uL (ref 3.87–5.11)
RDW: 16.1 % — AB (ref 11.5–15.5)
WBC: 13.2 10*3/uL — AB (ref 4.0–10.5)

## 2016-12-31 LAB — URINE CULTURE: Culture: NO GROWTH

## 2016-12-31 MED ORDER — AMOXICILLIN-POT CLAVULANATE 875-125 MG PO TABS: 1.0000 | ORAL_TABLET | Freq: Two times a day (BID) | ORAL | 0 refills | Status: DC

## 2016-12-31 MED ORDER — AMOXICILLIN-POT CLAVULANATE 875-125 MG PO TABS: 1.0000 | ORAL_TABLET | Freq: Two times a day (BID) | ORAL | 0 refills | Status: AC

## 2016-12-31 NOTE — Progress Notes (Signed)
Patient discharged to home take to car via wheelchair. Verbalized understanding of discharge instructions.

## 2016-12-31 NOTE — Discharge Summary (Signed)
Physician Discharge Summary  Patient ID: Regina [name] MRN: [id number] DOB/AGE: 1923-05-27 83 y.o. Primary Care Physician:Jaedyn Marrufo, Percell Miller, MD Admit date: 12/29/2016 Discharge date: 12/31/2016    Discharge Diagnoses:   Principal Problem:   Sepsis [facility]) Active Problems:   Atrial fibrillation ([place])   Jaundice   Choledochitis   Calculus of common bile duct with obstruction   Elevated troponin Occluded biliary stent Bacteremia with gram-negative rods  Allergies as of 12/31/2016   No Known Allergies     Medication List    TAKE these medications   acetaminophen 325 MG tablet Commonly known as:  TYLENOL Take 325 mg by mouth every 6 (six) hours as needed for headache (pain).   amoxicillin-clavulanate 875-125 MG tablet Commonly known as:  AUGMENTIN Take 1 tablet by mouth 2 (two) times daily.   apixaban 2.5 MG Tabs tablet Commonly known as:  ELIQUIS Take 1 tablet (2.5 mg total) by mouth 2 (two) times daily.   clotrimazole-betamethasone cream Commonly known as:  LOTRISONE Apply 1 application topically 2 (two) times daily as needed (rash).   diltiazem 120 MG 24 hr capsule Commonly known as:  CARDIZEM CD take 1 capsule by mouth once daily   levothyroxine 100 MCG tablet Commonly known as:  SYNTHROID, LEVOTHROID Take 100 mcg by mouth daily.   metoprolol tartrate 25 MG tablet Commonly known as:  LOPRESSOR take 1 tablet twice a day   ursodiol 250 MG tablet Commonly known as:  ACTIGALL Take 1 tablet (250 mg total) by mouth 2 (two) times daily.       Discharged Condition:Improved    Consults: GI  Significant Diagnostic Studies: Dg Chest 1 View  Result Date: 12/29/2016 CLINICAL DATA:  Elevated white count, abdomen pain with nausea EXAM: CHEST 1 VIEW COMPARISON:  11/10/2016, MRI 11/11/2016 FINDINGS: Cardiomegaly. Unable to rule out atelectasis or infiltrate at the left lung base. Aortic atherosclerosis. Large hiatal hernia. IMPRESSION: 1. Large hiatal hernia 2.  Cardiomegaly with mild central congestion 3. Unable to rule out atelectasis or infiltrate at the left lung base. Electronically Signed   By: Donavan Foil M.D.   On: 12/29/2016 15:03   Dg Ercp Biliary & Pancreatic Ducts  Result Date: 12/30/2016 CLINICAL DATA:  ERCP, BILIARY OBSTRUCTION, HISTORY OF STROKE, HTN, PREVIOUS ERCP 2017 Pt c/o all over abd pain and lwoer back pain. Pt had gallstones and put stent in this past June EXAM: ERCP TECHNIQUE: Multiple spot images obtained with the fluoroscopic device and submitted for interpretation post-procedure. COMPARISON:  11/11/2016 FINDINGS: A series of 12 fluoroscopic spot images document endoscopic cannulation and partial opacification of the CBD with placement of a plastic biliary stent. The intrahepatic biliary tree is not significantly opacified. IMPRESSION: 1. CBD stent placement These images were submitted for radiologic interpretation only. Please see the procedural report for the amount of contrast and the fluoroscopy time utilized. Electronically Signed   By: Lucrezia Europe M.D.   On: 12/30/2016 08:38   Dg Abd Portable 1v  Result Date: 12/29/2016 CLINICAL DATA:  Abdominal pain and elevated white blood cell count. History of biliary stent. Evaluate biliary stent prior to ERCP. EXAM: PORTABLE ABDOMEN - 1 VIEW COMPARISON:  Chest radiograph - 11/10/2016; ERCP - 11/11/2016 FINDINGS: The patient is rotated to the left. A plastic biliary stent overlies the right upper abdominal quadrant. Moderate colonic stool burden without evidence of enteric obstruction. Nondiagnostic evaluation for pneumoperitoneum secondary supine positioning and exclusion of the lower thorax. No pneumatosis or portal venous gas. Atherosclerotic plaque within the abdominal  aorta. Post left total hip replacement, incompletely evaluated. IMPRESSION: 1. Plastic biliary stent overlies the right upper abdominal quadrant, likely overlying the caudal aspect of common bile duct given patient rotation.  2.  Aortic Atherosclerosis (ICD10-I70.0). Electronically Signed   By: Sandi Mariscal M.D.   On: 12/29/2016 16:08    Lab Results: Basic Metabolic Panel:  Recent Labs  12/30/16 0630 12/31/16 0441  NA 136 140  K 4.2 3.9  CL 104 108  CO2 25 24  GLUCOSE 94 78  BUN 23* 22*  CREATININE 1.31* 1.36*  CALCIUM 7.7* 8.0*   Liver Function Tests:  Recent Labs  12/30/16 0630 12/31/16 0441  AST 93* 54*  ALT 94* 69*  ALKPHOS 82 69  BILITOT 3.6* 1.9*  PROT 5.7* 5.8*  ALBUMIN 2.6* 2.7*     CBC:  Recent Labs  12/29/16 1358 12/30/16 0630 12/31/16 0441  WBC 24.1* 19.6* 13.2*  NEUTROABS 22.1*  --  11.3*  HGB 13.7 12.0 11.8*  HCT 41.9 37.0 36.8  MCV 90.7 91.8 91.3  PLT 159 131* 134*    Recent Results (from the past 240 hour(s))  Culture, blood (routine x 2)     Status: None (Preliminary result)   Collection Time: 12/29/16  3:07 PM  Result Value Ref Range Status   Specimen Description LEFT ANTECUBITAL  Final   Special Requests Blood Culture adequate volume  Final   Culture  Setup Time   Final    GRAM NEGATIVE RODS Gram Stain Report Called to,Read Back By and Verified With: PHILLIPS,C. AT 7048 ON 12/30/2016 BY EVA AEROBIC BOTTLE ONLY Performed at [facility]    Culture   Final    GRAM NEGATIVE RODS IDENTIFICATION TO FOLLOW Performed at [facility], [place] [address] [place], [place] [address]    Report Status PENDING  Incomplete  Culture, blood (routine x 2)     Status: Abnormal (Preliminary result)   Collection Time: 12/29/16  3:10 PM  Result Value Ref Range Status   Specimen Description LEFT ANTECUBITAL  Final   Special Requests Blood Culture adequate volume  Final   Culture  Setup Time   Final    GRAM NEGATIVE RODS IN BOTH AEROBIC AND ANAEROBIC BOTTLES Gram Stain Report Called to,Read Back By and Verified With: HILTON,L @0830  BY MATTHEWS,B 12/30/16 CRITICAL RESULT CALLED TO, READ BACK BY AND VERIFIED WITH: L. POOLE PHARMD, AT 1300 12/30/16 BY D. VANHOOK     Culture (A)  Final    ESCHERICHIA COLI SUSCEPTIBILITIES TO FOLLOW Performed at [facility], [place] [address], [place],  [address]    Report Status PENDING  Incomplete  Blood Culture ID Panel (Reflexed)     Status: Abnormal   Collection Time: 12/29/16  3:10 PM  Result Value Ref Range Status   Enterococcus species NOT DETECTED NOT DETECTED Final   Listeria monocytogenes NOT DETECTED NOT DETECTED Final   Staphylococcus species NOT DETECTED NOT DETECTED Final   Staphylococcus aureus NOT DETECTED NOT DETECTED Final   Streptococcus species NOT DETECTED NOT DETECTED Final   Streptococcus agalactiae NOT DETECTED NOT DETECTED Final   Streptococcus pneumoniae NOT DETECTED NOT DETECTED Final   Streptococcus pyogenes NOT DETECTED NOT DETECTED Final   Acinetobacter baumannii NOT DETECTED NOT DETECTED Final   Enterobacteriaceae species DETECTED (A) NOT DETECTED Final    Comment: Enterobacteriaceae represent a large family of gram-negative bacteria, not a single organism. CRITICAL RESULT CALLED TO, READ BACK BY AND VERIFIED WITH: L. POOLE PHARMD, AT  1300 12/30/16 BY D. VANHOOK    Enterobacter cloacae complex NOT DETECTED NOT DETECTED Final   Escherichia coli DETECTED (A) NOT DETECTED Final    Comment: CRITICAL RESULT CALLED TO, READ BACK BY AND VERIFIED WITH: L. POOLE PHARMD, AT 1300 12/30/16 BY D. VANHOOK    Klebsiella oxytoca NOT DETECTED NOT DETECTED Final   Klebsiella pneumoniae NOT DETECTED NOT DETECTED Final   Proteus species NOT DETECTED NOT DETECTED Final   Serratia marcescens NOT DETECTED NOT DETECTED Final   Carbapenem resistance NOT DETECTED NOT DETECTED Final   Haemophilus influenzae NOT DETECTED NOT DETECTED Final   Neisseria meningitidis NOT DETECTED NOT DETECTED Final   Pseudomonas aeruginosa NOT DETECTED NOT DETECTED Final   Candida albicans NOT DETECTED NOT DETECTED Final   Candida glabrata NOT DETECTED NOT DETECTED Final   Candida krusei NOT DETECTED NOT DETECTED  Final   Candida parapsilosis NOT DETECTED NOT DETECTED Final   Candida tropicalis NOT DETECTED NOT DETECTED Final    Comment: Performed at [facility], [place] [address] [place], [place] [address]  Urine culture     Status: None   Collection Time: 12/29/16  3:26 PM  Result Value Ref Range Status   Specimen Description URINE, CATHETERIZED  Final   Special Requests NONE  Final   Culture   Final    NO GROWTH Performed at [facility], [address], [place], [place] [address]    Report Status 12/31/2016 FINAL  Final  MRSA PCR Screening     Status: None   Collection Time: 12/29/16  8:23 PM  Result Value Ref Range Status   MRSA by PCR NEGATIVE NEGATIVE Final    Comment:        The GeneXpert MRSA Assay (FDA approved for NASAL specimens only), is one component of a comprehensive MRSA colonization surveillance program. It is not intended to diagnose MRSA infection nor to guide or monitor treatment for MRSA infections.      Hospital Course: She came to the hospital with sepsis. She had abdominal pain and nausea. She is much improved after having biliary stent changed. A larger stent was placed. As soon as the stent was placed she felt better was able to eat. She has been able to tolerate regular diet. She does have a positive blood culture and the identification is not complete but it looks like it's from Escherichia coli. Because of concerns about the fact that she had a hip replacement I discussed by phone with infectious disease and they suggested 3 weeks of oral antibiotics. She is ready for discharge.  Discharge Exam: Blood pressure 122/63, pulse 90, temperature 99.1 F (37.3 C), temperature source Oral, resp. rate (!) 25, height 5\' 4"  (1.626 m), weight 55.4 kg (122 lb 2.2 oz), SpO2 (!) 89 %. She is awake and alert. She is in atrial fib. Her chest is clear. Her abdomen is soft.  Disposition: Discharge home with home health services  Discharge Instructions    Call MD  for:  persistant nausea and vomiting    Complete by:  As directed    Call MD for:  redness, tenderness, or signs of infection (pain, swelling, redness, odor or green/yellow discharge around incision site)    Complete by:  As directed    Call MD for:  severe uncontrolled pain    Complete by:  As directed    Call MD for:  temperature >100.4    Complete by:  As directed    Diet -  low sodium heart healthy    Complete by:  As directed    Face-to-face encounter (required for Medicare/Medicaid patients)    Complete by:  As directed    I Rosmarie Esquibel L certify that this patient is under my care and that I, or a nurse practitioner or physician's assistant working with me, had a face-to-face encounter that meets the physician face-to-face encounter requirements with this patient on 12/31/2016. The encounter with the patient was in whole, or in part for the following medical condition(s) which is the primary reason for home health care (List medical condition): biliary infection   The encounter with the patient was in whole, or in part, for the following medical condition, which is the primary reason for home health care:  biliary infection   I certify that, based on my findings, the following services are medically necessary home health services:   Nursing Physical therapy     Reason for Medically Necessary Home Health Services:  Skilled Nursing- Change/Decline in Patient Status   My clinical findings support the need for the above services:  Unable to leave home safely without assistance and/or assistive device   Further, I certify that my clinical findings support that this patient is homebound due to:  Unable to leave home safely without assistance   Home Health    Complete by:  As directed    To provide the following care/treatments:   PT RN     Cbc and CMET on 01/03/2017, please   Increase activity slowly    Complete by:  As directed         Signed: Merland Holness L   12/31/2016, 10:59  AM

## 2016-12-31 NOTE — Progress Notes (Signed)
Subjective: She says she feels well. She is able to eat and is not having any nausea. No fever. Her blood culture was positive for what looks like Escherichia coli. She has received Zosyn. This is likely related to her stent being occluded. She wants to go home.  Objective: Vital signs in last 24 hours: Temp:  [98 F (36.7 C)-99.1 F (37.3 C)] 99.1 F (37.3 C) (07/28 2000) Pulse Rate:  [65-112] 112 (07/29 0800) Resp:  [14-31] 24 (07/29 0800) BP: (89-160)/(55-118) 160/79 (07/29 0800) SpO2:  [79 %-98 %] 96 % (07/29 0800) Weight change:  Last BM Date: 12/30/16  Intake/Output from previous day: 07/28 0701 - 07/29 0700 In: 1460 [P.O.:1310; IV Piggyback:150] Out: 901 [Urine:900; Stool:1]  PHYSICAL EXAM General appearance: alert and cooperative Resp: clear to auscultation bilaterally Cardio: irregularly irregular rhythm GI: soft, non-tender; bowel sounds normal; no masses,  no organomegaly Extremities: extremities normal, atraumatic, no cyanosis or edema Skin warm and dry  Lab Results:  Results for orders placed or performed during the hospital encounter of 12/29/16 (from the past 48 hour(s))  Protime-INR     Status: Abnormal   Collection Time: 12/29/16  1:35 PM  Result Value Ref Range   Prothrombin Time 16.4 (H) 11.4 - 15.2 seconds   INR 1.32   Comprehensive metabolic panel     Status: Abnormal   Collection Time: 12/29/16  1:58 PM  Result Value Ref Range   Sodium 139 135 - 145 mmol/L   Potassium 4.1 3.5 - 5.1 mmol/L   Chloride 103 101 - 111 mmol/L   CO2 22 22 - 32 mmol/L   Glucose, Bld 175 (H) 65 - 99 mg/dL   BUN 18 6 - 20 mg/dL   Creatinine, Ser 1.43 (H) 0.44 - 1.00 mg/dL   Calcium 9.3 8.9 - 10.3 mg/dL   Total Protein 7.2 6.5 - 8.1 g/dL   Albumin 3.2 (L) 3.5 - 5.0 g/dL   AST 173 (H) 15 - 41 U/L   ALT 139 (H) 14 - 54 U/L   Alkaline Phosphatase 125 38 - 126 U/L   Total Bilirubin 4.5 (H) 0.3 - 1.2 mg/dL   GFR calc non Af Amer 31 (L) >60 mL/min   GFR calc Af Amer 36 (L)  >60 mL/min    Comment: (NOTE) The eGFR has been calculated using the CKD EPI equation. This calculation has not been validated in all clinical situations. eGFR's persistently <60 mL/min signify possible Chronic Kidney Disease.    Anion gap 14 5 - 15  Lipase, blood     Status: None   Collection Time: 12/29/16  1:58 PM  Result Value Ref Range   Lipase 35 11 - 51 U/L  CBC with Differential     Status: Abnormal   Collection Time: 12/29/16  1:58 PM  Result Value Ref Range   WBC 24.1 (H) 4.0 - 10.5 K/uL   RBC 4.62 3.87 - 5.11 MIL/uL   Hemoglobin 13.7 12.0 - 15.0 g/dL   HCT 41.9 36.0 - 46.0 %   MCV 90.7 78.0 - 100.0 fL   MCH 29.7 26.0 - 34.0 pg   MCHC 32.7 30.0 - 36.0 g/dL   RDW 15.8 (H) 11.5 - 15.5 %   Platelets 159 150 - 400 K/uL   Neutrophils Relative % 92 %   Neutro Abs 22.1 (H) 1.7 - 7.7 K/uL   Lymphocytes Relative 2 %   Lymphs Abs 0.4 (L) 0.7 - 4.0 K/uL   Monocytes Relative 6 %  Monocytes Absolute 1.5 (H) 0.1 - 1.0 K/uL   Eosinophils Relative 0 %   Eosinophils Absolute 0.0 0.0 - 0.7 K/uL   Basophils Relative 0 %   Basophils Absolute 0.0 0.0 - 0.1 K/uL  Troponin I     Status: Abnormal   Collection Time: 12/29/16  1:58 PM  Result Value Ref Range   Troponin I 0.06 (HH) <0.03 ng/mL    Comment: CRITICAL RESULT CALLED TO, READ BACK BY AND VERIFIED WITH: HASKINS,K ON 12/29/16 AT 1550 BY LOY,C   Ammonia     Status: None   Collection Time: 12/29/16  1:58 PM  Result Value Ref Range   Ammonia 29 9 - 35 umol/L  Culture, blood (routine x 2)     Status: None (Preliminary result)   Collection Time: 12/29/16  3:07 PM  Result Value Ref Range   Specimen Description LEFT ANTECUBITAL    Special Requests Blood Culture adequate volume    Culture  Setup Time      GRAM NEGATIVE RODS Gram Stain Report Called to,Read Back By and Verified With: PHILLIPS,C. AT 2637 ON 12/30/2016 BY EVA AEROBIC BOTTLE ONLY Performed at [facility] TO  FOLLOW Performed at [facility], [place] [address] [place], [place] [address]    Report Status PENDING   Culture, blood (routine x 2)     Status: Abnormal (Preliminary result)   Collection Time: 12/29/16  3:10 PM  Result Value Ref Range   Specimen Description LEFT ANTECUBITAL    Special Requests Blood Culture adequate volume    Culture  Setup Time      GRAM NEGATIVE RODS IN BOTH AEROBIC AND ANAEROBIC BOTTLES Gram Stain Report Called to,Read Back By and Verified With: HILTON,L '@0830'  BY MATTHEWS,B 12/30/16 CRITICAL RESULT CALLED TO, READ BACK BY AND VERIFIED WITH: L. POOLE PHARMD, AT 1300 12/30/16 BY D. VANHOOK    Culture (A)     ESCHERICHIA COLI SUSCEPTIBILITIES TO FOLLOW Performed at [facility], [place] [address] [place], [place] [address]    Report Status PENDING   Blood Culture ID Panel (Reflexed)     Status: Abnormal   Collection Time: 12/29/16  3:10 PM  Result Value Ref Range   Enterococcus species NOT DETECTED NOT DETECTED   Listeria monocytogenes NOT DETECTED NOT DETECTED   Staphylococcus species NOT DETECTED NOT DETECTED   Staphylococcus aureus NOT DETECTED NOT DETECTED   Streptococcus species NOT DETECTED NOT DETECTED   Streptococcus agalactiae NOT DETECTED NOT DETECTED   Streptococcus pneumoniae NOT DETECTED NOT DETECTED   Streptococcus pyogenes NOT DETECTED NOT DETECTED   Acinetobacter baumannii NOT DETECTED NOT DETECTED   Enterobacteriaceae species DETECTED (A) NOT DETECTED    Comment: Enterobacteriaceae represent a large family of gram-negative bacteria, not a single organism. CRITICAL RESULT CALLED TO, READ BACK BY AND VERIFIED WITH: L. POOLE PHARMD, AT 1300 12/30/16 BY D. VANHOOK    Enterobacter cloacae complex NOT DETECTED NOT DETECTED   Escherichia coli DETECTED (A) NOT DETECTED    Comment: CRITICAL RESULT CALLED TO, READ BACK BY AND VERIFIED WITH: L. POOLE PHARMD, AT 1300 12/30/16 BY D. VANHOOK    Klebsiella oxytoca NOT DETECTED NOT DETECTED    Klebsiella pneumoniae NOT DETECTED NOT DETECTED   Proteus species NOT DETECTED NOT DETECTED   Serratia marcescens NOT DETECTED NOT DETECTED   Carbapenem resistance NOT DETECTED NOT DETECTED   Haemophilus influenzae NOT DETECTED NOT DETECTED  Neisseria meningitidis NOT DETECTED NOT DETECTED   Pseudomonas aeruginosa NOT DETECTED NOT DETECTED   Candida albicans NOT DETECTED NOT DETECTED   Candida glabrata NOT DETECTED NOT DETECTED   Candida krusei NOT DETECTED NOT DETECTED   Candida parapsilosis NOT DETECTED NOT DETECTED   Candida tropicalis NOT DETECTED NOT DETECTED    Comment: Performed at [facility], [place] [address] [place], [place] [address]  Urinalysis, Routine w reflex microscopic     Status: Abnormal   Collection Time: 12/29/16  3:26 PM  Result Value Ref Range   Color, Urine AMBER (A) YELLOW    Comment: BIOCHEMICALS MAY BE AFFECTED BY COLOR   APPearance HAZY (A) CLEAR   Specific Gravity, Urine 1.021 1.005 - 1.030   pH 5.0 5.0 - 8.0   Glucose, UA NEGATIVE NEGATIVE mg/dL   Hgb urine dipstick SMALL (A) NEGATIVE   Bilirubin Urine MODERATE (A) NEGATIVE   Ketones, ur NEGATIVE NEGATIVE mg/dL   Protein, ur 100 (A) NEGATIVE mg/dL   Nitrite NEGATIVE NEGATIVE   Leukocytes, UA NEGATIVE NEGATIVE   RBC / HPF 0-5 0 - 5 RBC/hpf   WBC, UA 6-30 0 - 5 WBC/hpf   Bacteria, UA RARE (A) NONE SEEN   Squamous Epithelial / LPF 0-5 (A) NONE SEEN   Mucous PRESENT    Hyaline Casts, UA PRESENT    Non Squamous Epithelial 0-5 (A) NONE SEEN  Urine culture     Status: None   Collection Time: 12/29/16  3:26 PM  Result Value Ref Range   Specimen Description URINE, CATHETERIZED    Special Requests NONE    Culture      NO GROWTH Performed at [facility], [address] [place], [place] [address]    Report Status 12/31/2016 FINAL   Lactic acid, plasma     Status: Abnormal   Collection Time: 12/29/16  8:12 PM  Result Value Ref Range   Lactic Acid, Venous 3.5 (HH) 0.5 - 1.9 mmol/L     Comment: CRITICAL RESULT CALLED TO, READ BACK BY AND VERIFIED WITH: WAGONER,R ON 12/29/16 AT 2130 BY LOY,C   Troponin I (q 6hr x 3)     Status: Abnormal   Collection Time: 12/29/16  8:12 PM  Result Value Ref Range   Troponin I 0.05 (HH) <0.03 ng/mL    Comment: CRITICAL VALUE NOTED.  VALUE IS CONSISTENT WITH PREVIOUSLY REPORTED AND CALLED VALUE.  MRSA PCR Screening     Status: None   Collection Time: 12/29/16  8:23 PM  Result Value Ref Range   MRSA by PCR NEGATIVE NEGATIVE    Comment:        The GeneXpert MRSA Assay (FDA approved for NASAL specimens only), is one component of a comprehensive MRSA colonization surveillance program. It is not intended to diagnose MRSA infection nor to guide or monitor treatment for MRSA infections.   Troponin I (q 6hr x 3)     Status: Abnormal   Collection Time: 12/30/16 12:59 AM  Result Value Ref Range   Troponin I 0.04 (HH) <0.03 ng/mL    Comment: CRITICAL VALUE NOTED.  VALUE IS CONSISTENT WITH PREVIOUSLY REPORTED AND CALLED VALUE.  Lactic acid, plasma     Status: Abnormal   Collection Time: 12/30/16 12:59 AM  Result Value Ref Range   Lactic Acid, Venous 2.2 (HH) 0.5 - 1.9 mmol/L    Comment: CRITICAL RESULT CALLED TO, READ BACK BY AND VERIFIED WITH:  HOWARD,C @ 0204 ON 12/30/16 BY JUW  Troponin I (q 6hr x 3)     Status: Abnormal   Collection Time: 12/30/16  6:30 AM  Result Value Ref Range   Troponin I 0.05 (HH) <0.03 ng/mL    Comment: CRITICAL RESULT CALLED TO, READ BACK BY AND VERIFIED WITH: DANIELS,J @ 0744 BY MATTHEWS,B 12/30/16   CBC     Status: Abnormal   Collection Time: 12/30/16  6:30 AM  Result Value Ref Range   WBC 19.6 (H) 4.0 - 10.5 K/uL   RBC 4.03 3.87 - 5.11 MIL/uL   Hemoglobin 12.0 12.0 - 15.0 g/dL   HCT 37.0 36.0 - 46.0 %   MCV 91.8 78.0 - 100.0 fL   MCH 29.8 26.0 - 34.0 pg   MCHC 32.4 30.0 - 36.0 g/dL   RDW 16.1 (H) 11.5 - 15.5 %   Platelets 131 (L) 150 - 400 K/uL  Comprehensive metabolic panel     Status: Abnormal    Collection Time: 12/30/16  6:30 AM  Result Value Ref Range   Sodium 136 135 - 145 mmol/L   Potassium 4.2 3.5 - 5.1 mmol/L   Chloride 104 101 - 111 mmol/L   CO2 25 22 - 32 mmol/L   Glucose, Bld 94 65 - 99 mg/dL   BUN 23 (H) 6 - 20 mg/dL   Creatinine, Ser 1.31 (H) 0.44 - 1.00 mg/dL   Calcium 7.7 (L) 8.9 - 10.3 mg/dL   Total Protein 5.7 (L) 6.5 - 8.1 g/dL   Albumin 2.6 (L) 3.5 - 5.0 g/dL   AST 93 (H) 15 - 41 U/L   ALT 94 (H) 14 - 54 U/L   Alkaline Phosphatase 82 38 - 126 U/L   Total Bilirubin 3.6 (H) 0.3 - 1.2 mg/dL   GFR calc non Af Amer 34 (L) >60 mL/min   GFR calc Af Amer 40 (L) >60 mL/min    Comment: (NOTE) The eGFR has been calculated using the CKD EPI equation. This calculation has not been validated in all clinical situations. eGFR's persistently <60 mL/min signify possible Chronic Kidney Disease.    Anion gap 7 5 - 15  Comprehensive metabolic panel     Status: Abnormal   Collection Time: 12/31/16  4:41 AM  Result Value Ref Range   Sodium 140 135 - 145 mmol/L   Potassium 3.9 3.5 - 5.1 mmol/L   Chloride 108 101 - 111 mmol/L   CO2 24 22 - 32 mmol/L   Glucose, Bld 78 65 - 99 mg/dL   BUN 22 (H) 6 - 20 mg/dL   Creatinine, Ser 1.36 (H) 0.44 - 1.00 mg/dL   Calcium 8.0 (L) 8.9 - 10.3 mg/dL   Total Protein 5.8 (L) 6.5 - 8.1 g/dL   Albumin 2.7 (L) 3.5 - 5.0 g/dL   AST 54 (H) 15 - 41 U/L   ALT 69 (H) 14 - 54 U/L   Alkaline Phosphatase 69 38 - 126 U/L   Total Bilirubin 1.9 (H) 0.3 - 1.2 mg/dL   GFR calc non Af Amer 33 (L) >60 mL/min   GFR calc Af Amer 38 (L) >60 mL/min    Comment: (NOTE) The eGFR has been calculated using the CKD EPI equation. This calculation has not been validated in all clinical situations. eGFR's persistently <60 mL/min signify possible Chronic Kidney Disease.    Anion gap 8 5 - 15  CBC with Differential/Platelet     Status: Abnormal   Collection Time: 12/31/16  4:41 AM  Result Value Ref Range  WBC 13.2 (H) 4.0 - 10.5 K/uL   RBC 4.03 3.87 - 5.11  MIL/uL   Hemoglobin 11.8 (L) 12.0 - 15.0 g/dL   HCT 36.8 36.0 - 46.0 %   MCV 91.3 78.0 - 100.0 fL   MCH 29.3 26.0 - 34.0 pg   MCHC 32.1 30.0 - 36.0 g/dL   RDW 16.1 (H) 11.5 - 15.5 %   Platelets 134 (L) 150 - 400 K/uL   Neutrophils Relative % 85 %   Neutro Abs 11.3 (H) 1.7 - 7.7 K/uL   Lymphocytes Relative 7 %   Lymphs Abs 0.9 0.7 - 4.0 K/uL   Monocytes Relative 6 %   Monocytes Absolute 0.7 0.1 - 1.0 K/uL   Eosinophils Relative 2 %   Eosinophils Absolute 0.2 0.0 - 0.7 K/uL   Basophils Relative 0 %   Basophils Absolute 0.0 0.0 - 0.1 K/uL    ABGS No results for input(s): PHART, PO2ART, TCO2, HCO3 in the last 72 hours.  Invalid input(s): PCO2 CULTURES Recent Results (from the past 240 hour(s))  Culture, blood (routine x 2)     Status: None (Preliminary result)   Collection Time: 12/29/16  3:07 PM  Result Value Ref Range Status   Specimen Description LEFT ANTECUBITAL  Final   Special Requests Blood Culture adequate volume  Final   Culture  Setup Time   Final    GRAM NEGATIVE RODS Gram Stain Report Called to,Read Back By and Verified With: PHILLIPS,C. AT 1517 ON 12/30/2016 BY EVA AEROBIC BOTTLE ONLY Performed at [facility]    Culture   Final    [place] Performed at [facility], [place] [address] [place], [place] [address]    Report Status PENDING  Incomplete  Culture, blood (routine x 2)     Status: Abnormal (Preliminary result)   Collection Time: 12/29/16  3:10 PM  Result Value Ref Range Status   Specimen Description LEFT ANTECUBITAL  Final   Special Requests Blood Culture adequate volume  Final   Culture  Setup Time   Final    GRAM NEGATIVE RODS IN BOTH AEROBIC AND ANAEROBIC BOTTLES Gram Stain Report Called to,Read Back By and Verified With: HILTON,L '@0830'  BY MATTHEWS,B 12/30/16 CRITICAL RESULT CALLED TO, READ BACK BY AND VERIFIED WITH: L. POOLE PHARMD, AT 1300 12/30/16 BY D. VANHOOK    Culture (A)  Final    ESCHERICHIA  COLI SUSCEPTIBILITIES TO FOLLOW Performed at [facility], [place] [address] [place], [place] [address]    Report Status PENDING  Incomplete  Blood Culture ID Panel (Reflexed)     Status: Abnormal   Collection Time: 12/29/16  3:10 PM  Result Value Ref Range Status   Enterococcus species NOT DETECTED NOT DETECTED Final   Listeria monocytogenes NOT DETECTED NOT DETECTED Final   Staphylococcus species NOT DETECTED NOT DETECTED Final   Staphylococcus aureus NOT DETECTED NOT DETECTED Final   Streptococcus species NOT DETECTED NOT DETECTED Final   Streptococcus agalactiae NOT DETECTED NOT DETECTED Final   Streptococcus pneumoniae NOT DETECTED NOT DETECTED Final   Streptococcus pyogenes NOT DETECTED NOT DETECTED Final   Acinetobacter baumannii NOT DETECTED NOT DETECTED Final   Enterobacteriaceae species DETECTED (A) NOT DETECTED Final    Comment: Enterobacteriaceae represent a large family of gram-negative bacteria, not a single organism. CRITICAL RESULT CALLED TO, READ BACK BY AND VERIFIED WITH: L. POOLE PHARMD, AT 1300 12/30/16 BY D. VANHOOK    Enterobacter cloacae complex NOT  DETECTED NOT DETECTED Final   Escherichia coli DETECTED (A) NOT DETECTED Final    Comment: CRITICAL RESULT CALLED TO, READ BACK BY AND VERIFIED WITH: L. POOLE PHARMD, AT 1300 12/30/16 BY D. VANHOOK    Klebsiella oxytoca NOT DETECTED NOT DETECTED Final   Klebsiella pneumoniae NOT DETECTED NOT DETECTED Final   Proteus species NOT DETECTED NOT DETECTED Final   Serratia marcescens NOT DETECTED NOT DETECTED Final   Carbapenem resistance NOT DETECTED NOT DETECTED Final   Haemophilus influenzae NOT DETECTED NOT DETECTED Final   Neisseria meningitidis NOT DETECTED NOT DETECTED Final   Pseudomonas aeruginosa NOT DETECTED NOT DETECTED Final   Candida albicans NOT DETECTED NOT DETECTED Final   Candida glabrata NOT DETECTED NOT DETECTED Final   Candida krusei NOT DETECTED NOT DETECTED Final   Candida parapsilosis NOT  DETECTED NOT DETECTED Final   Candida tropicalis NOT DETECTED NOT DETECTED Final    Comment: Performed at [facility], [place] [address], [place], [place] [address]  Urine culture     Status: None   Collection Time: 12/29/16  3:26 PM  Result Value Ref Range Status   Specimen Description URINE, CATHETERIZED  Final   Special Requests NONE  Final   Culture   Final    NO GROWTH Performed at [facility], [address] [place], [place] [address]    Report Status 12/31/2016 FINAL  Final  MRSA PCR Screening     Status: None   Collection Time: 12/29/16  8:23 PM  Result Value Ref Range Status   MRSA by PCR NEGATIVE NEGATIVE Final    Comment:        The GeneXpert MRSA Assay (FDA approved for NASAL specimens only), is one component of a comprehensive MRSA colonization surveillance program. It is not intended to diagnose MRSA infection nor to guide or monitor treatment for MRSA infections.    Studies/Results: Dg Chest 1 View  Result Date: 12/29/2016 CLINICAL DATA:  Elevated white count, abdomen pain with nausea EXAM: CHEST 1 VIEW COMPARISON:  11/10/2016, MRI 11/11/2016 FINDINGS: Cardiomegaly. Unable to rule out atelectasis or infiltrate at the left lung base. Aortic atherosclerosis. Large hiatal hernia. IMPRESSION: 1. Large hiatal hernia 2. Cardiomegaly with mild central congestion 3. Unable to rule out atelectasis or infiltrate at the left lung base. Electronically Signed   By: Donavan Foil M.D.   On: 12/29/2016 15:03   Dg Ercp Biliary & Pancreatic Ducts  Result Date: 12/30/2016 CLINICAL DATA:  ERCP, BILIARY OBSTRUCTION, HISTORY OF STROKE, HTN, PREVIOUS ERCP 2017 Pt c/o all over abd pain and lwoer back pain. Pt had gallstones and put stent in this past June EXAM: ERCP TECHNIQUE: Multiple spot images obtained with the fluoroscopic device and submitted for interpretation post-procedure. COMPARISON:  11/11/2016 FINDINGS: A series of 12 fluoroscopic spot images document endoscopic  cannulation and partial opacification of the CBD with placement of a plastic biliary stent. The intrahepatic biliary tree is not significantly opacified. IMPRESSION: 1. CBD stent placement These images were submitted for radiologic interpretation only. Please see the procedural report for the amount of contrast and the fluoroscopy time utilized. Electronically Signed   By: Lucrezia Europe M.D.   On: 12/30/2016 08:38   Dg Abd Portable 1v  Result Date: 12/29/2016 CLINICAL DATA:  Abdominal pain and elevated white blood cell count. History of biliary stent. Evaluate biliary stent prior to ERCP. EXAM: PORTABLE ABDOMEN - 1 VIEW COMPARISON:  Chest radiograph - 11/10/2016; ERCP - 11/11/2016 FINDINGS: The patient is  rotated to the left. A plastic biliary stent overlies the right upper abdominal quadrant. Moderate colonic stool burden without evidence of enteric obstruction. Nondiagnostic evaluation for pneumoperitoneum secondary supine positioning and exclusion of the lower thorax. No pneumatosis or portal venous gas. Atherosclerotic plaque within the abdominal aorta. Post left total hip replacement, incompletely evaluated. IMPRESSION: 1. Plastic biliary stent overlies the right upper abdominal quadrant, likely overlying the caudal aspect of common bile duct given patient rotation. 2.  Aortic Atherosclerosis (ICD10-I70.0). Electronically Signed   By: Sandi Mariscal M.D.   On: 12/29/2016 16:08    Medications:  Prior to Admission:  Prescriptions Prior to Admission  Medication Sig Dispense Refill Last Dose  . apixaban (ELIQUIS) 2.5 MG TABS tablet Take 1 tablet (2.5 mg total) by mouth 2 (two) times daily. 60 tablet 6 12/29/2016 at 0800  . clotrimazole-betamethasone (LOTRISONE) cream Apply 1 application topically 2 (two) times daily as needed (rash).   1 Past Week at Unknown time  . metoprolol tartrate (LOPRESSOR) 25 MG tablet take 1 tablet twice a day 60 tablet 0 12/29/2016 at 1200  . ursodiol (ACTIGALL) 250 MG tablet Take 1  tablet (250 mg total) by mouth 2 (two) times daily. 60 tablet 5 Past Week at Unknown time  . acetaminophen (TYLENOL) 325 MG tablet Take 325 mg by mouth every 6 (six) hours as needed for headache (pain).   More than a month at Unknown time  . diltiazem (CARDIZEM CD) 120 MG 24 hr capsule take 1 capsule by mouth once daily 90 capsule 2 12/29/2016 at 0800  . levothyroxine (SYNTHROID, LEVOTHROID) 100 MCG tablet Take 100 mcg by mouth daily.    12/29/2016 at 0800   Scheduled: . diltiazem  120 mg Oral Daily  . levothyroxine  100 mcg Oral QAC breakfast  . pantoprazole  40 mg Oral QAC supper   Continuous: . piperacillin-tazobactam (ZOSYN)  IV 3.375 g (12/31/16 0610)  . sodium chloride    . sodium chloride     ENM:[id number] **OR** acetaminophen, HYDROmorphone (DILAUDID) injection, levalbuterol, ondansetron **OR** ondansetron (ZOFRAN) IV  Assesment:She was admitted with sepsis. She has what seems to be Escherichia coli in the blood although full identification is not back yet. This is related to occlusion of her biliary stent. She is much better. She is eating with no difficulty. No significant fever. No symptoms. I discussed with infectious disease and since she does have a artificial hip and is recommended that she be treated with oral antibiotics for 3 weeks. She is ready for discharge. Principal Problem:   Sepsis ([facility]) Active Problems:   Atrial fibrillation ([facility])   Jaundice   Choledochitis   Calculus of common bile duct with obstruction   Elevated troponin    Plan: Discharge home    LOS: 2 days

## 2016-12-31 NOTE — Telephone Encounter (Signed)
OPV W/ DR. Laural Golden IN 6 WEEKS E30 CBD STONE/STENT

## 2017-01-01 ENCOUNTER — Encounter (HOSPITAL_COMMUNITY): Payer: Self-pay | Admitting: Internal Medicine

## 2017-01-01 DIAGNOSIS — Z96642 Presence of left artificial hip joint: Secondary | ICD-10-CM | POA: Diagnosis not present

## 2017-01-01 DIAGNOSIS — K8030 Calculus of bile duct with cholangitis, unspecified, without obstruction: Secondary | ICD-10-CM | POA: Diagnosis not present

## 2017-01-01 DIAGNOSIS — Z8673 Personal history of transient ischemic attack (TIA), and cerebral infarction without residual deficits: Secondary | ICD-10-CM | POA: Diagnosis not present

## 2017-01-01 DIAGNOSIS — I10 Essential (primary) hypertension: Secondary | ICD-10-CM | POA: Diagnosis not present

## 2017-01-01 DIAGNOSIS — I4891 Unspecified atrial fibrillation: Secondary | ICD-10-CM | POA: Diagnosis not present

## 2017-01-01 DIAGNOSIS — Z5181 Encounter for therapeutic drug level monitoring: Secondary | ICD-10-CM | POA: Diagnosis not present

## 2017-01-01 DIAGNOSIS — Z7901 Long term (current) use of anticoagulants: Secondary | ICD-10-CM | POA: Diagnosis not present

## 2017-01-01 LAB — CULTURE, BLOOD (ROUTINE X 2): Special Requests: ADEQUATE

## 2017-01-01 NOTE — Telephone Encounter (Signed)
Forwarded to [facility] to schedule OV 

## 2017-01-01 NOTE — Telephone Encounter (Signed)
A staff message has been sent to [name] for an appointment for 6 weeks for this patient.

## 2017-01-03 DIAGNOSIS — I4891 Unspecified atrial fibrillation: Secondary | ICD-10-CM | POA: Diagnosis not present

## 2017-01-03 DIAGNOSIS — Z5181 Encounter for therapeutic drug level monitoring: Secondary | ICD-10-CM | POA: Diagnosis not present

## 2017-01-03 DIAGNOSIS — Z96642 Presence of left artificial hip joint: Secondary | ICD-10-CM | POA: Diagnosis not present

## 2017-01-03 DIAGNOSIS — K8030 Calculus of bile duct with cholangitis, unspecified, without obstruction: Secondary | ICD-10-CM | POA: Diagnosis not present

## 2017-01-03 DIAGNOSIS — Z8673 Personal history of transient ischemic attack (TIA), and cerebral infarction without residual deficits: Secondary | ICD-10-CM | POA: Diagnosis not present

## 2017-01-03 DIAGNOSIS — K830 Cholangitis: Secondary | ICD-10-CM | POA: Diagnosis not present

## 2017-01-03 DIAGNOSIS — I10 Essential (primary) hypertension: Secondary | ICD-10-CM | POA: Diagnosis not present

## 2017-01-03 LAB — CULTURE, BLOOD (ROUTINE X 2): SPECIAL REQUESTS: ADEQUATE

## 2017-01-09 DIAGNOSIS — Z5181 Encounter for therapeutic drug level monitoring: Secondary | ICD-10-CM | POA: Diagnosis not present

## 2017-01-09 DIAGNOSIS — Z8673 Personal history of transient ischemic attack (TIA), and cerebral infarction without residual deficits: Secondary | ICD-10-CM | POA: Diagnosis not present

## 2017-01-09 DIAGNOSIS — I10 Essential (primary) hypertension: Secondary | ICD-10-CM | POA: Diagnosis not present

## 2017-01-09 DIAGNOSIS — K8030 Calculus of bile duct with cholangitis, unspecified, without obstruction: Secondary | ICD-10-CM | POA: Diagnosis not present

## 2017-01-09 DIAGNOSIS — Z96642 Presence of left artificial hip joint: Secondary | ICD-10-CM | POA: Diagnosis not present

## 2017-01-09 DIAGNOSIS — I4891 Unspecified atrial fibrillation: Secondary | ICD-10-CM | POA: Diagnosis not present

## 2017-01-11 ENCOUNTER — Encounter (INDEPENDENT_AMBULATORY_CARE_PROVIDER_SITE_OTHER): Payer: Self-pay | Admitting: Internal Medicine

## 2017-01-12 DIAGNOSIS — Z5181 Encounter for therapeutic drug level monitoring: Secondary | ICD-10-CM | POA: Diagnosis not present

## 2017-01-12 DIAGNOSIS — Z8673 Personal history of transient ischemic attack (TIA), and cerebral infarction without residual deficits: Secondary | ICD-10-CM | POA: Diagnosis not present

## 2017-01-12 DIAGNOSIS — I4891 Unspecified atrial fibrillation: Secondary | ICD-10-CM | POA: Diagnosis not present

## 2017-01-12 DIAGNOSIS — Z96642 Presence of left artificial hip joint: Secondary | ICD-10-CM | POA: Diagnosis not present

## 2017-01-12 DIAGNOSIS — I10 Essential (primary) hypertension: Secondary | ICD-10-CM | POA: Diagnosis not present

## 2017-01-12 DIAGNOSIS — K8030 Calculus of bile duct with cholangitis, unspecified, without obstruction: Secondary | ICD-10-CM | POA: Diagnosis not present

## 2017-01-15 DIAGNOSIS — K8030 Calculus of bile duct with cholangitis, unspecified, without obstruction: Secondary | ICD-10-CM | POA: Diagnosis not present

## 2017-01-15 DIAGNOSIS — Z96642 Presence of left artificial hip joint: Secondary | ICD-10-CM | POA: Diagnosis not present

## 2017-01-15 DIAGNOSIS — Z5181 Encounter for therapeutic drug level monitoring: Secondary | ICD-10-CM | POA: Diagnosis not present

## 2017-01-15 DIAGNOSIS — I10 Essential (primary) hypertension: Secondary | ICD-10-CM | POA: Diagnosis not present

## 2017-01-15 DIAGNOSIS — I4891 Unspecified atrial fibrillation: Secondary | ICD-10-CM | POA: Diagnosis not present

## 2017-01-15 DIAGNOSIS — Z8673 Personal history of transient ischemic attack (TIA), and cerebral infarction without residual deficits: Secondary | ICD-10-CM | POA: Diagnosis not present

## 2017-02-01 DIAGNOSIS — K8030 Calculus of bile duct with cholangitis, unspecified, without obstruction: Secondary | ICD-10-CM | POA: Diagnosis not present

## 2017-02-01 DIAGNOSIS — I4891 Unspecified atrial fibrillation: Secondary | ICD-10-CM | POA: Diagnosis not present

## 2017-02-01 DIAGNOSIS — I10 Essential (primary) hypertension: Secondary | ICD-10-CM | POA: Diagnosis not present

## 2017-02-01 DIAGNOSIS — Z5181 Encounter for therapeutic drug level monitoring: Secondary | ICD-10-CM | POA: Diagnosis not present

## 2017-02-01 DIAGNOSIS — Z96642 Presence of left artificial hip joint: Secondary | ICD-10-CM | POA: Diagnosis not present

## 2017-02-01 DIAGNOSIS — Z8673 Personal history of transient ischemic attack (TIA), and cerebral infarction without residual deficits: Secondary | ICD-10-CM | POA: Diagnosis not present

## 2017-02-13 ENCOUNTER — Ambulatory Visit (INDEPENDENT_AMBULATORY_CARE_PROVIDER_SITE_OTHER): Payer: Medicare Other | Admitting: Internal Medicine

## 2017-02-13 ENCOUNTER — Encounter (INDEPENDENT_AMBULATORY_CARE_PROVIDER_SITE_OTHER): Payer: Self-pay | Admitting: Internal Medicine

## 2017-02-13 VITALS — BP 110/68 | HR 66 | Temp 97.0°F | Resp 18 | Ht 64.0 in | Wt 112.9 lb

## 2017-02-13 DIAGNOSIS — K8050 Calculus of bile duct without cholangitis or cholecystitis without obstruction: Secondary | ICD-10-CM

## 2017-02-13 LAB — HEPATIC FUNCTION PANEL
AG RATIO: 1.1 (calc) (ref 1.0–2.5)
ALBUMIN MSPROF: 4 g/dL (ref 3.6–5.1)
ALKALINE PHOSPHATASE (APISO): 94 U/L (ref 33–130)
ALT: 14 U/L (ref 6–29)
AST: 21 U/L (ref 10–35)
BILIRUBIN DIRECT: 0.2 mg/dL (ref 0.0–0.2)
BILIRUBIN TOTAL: 0.6 mg/dL (ref 0.2–1.2)
Globulin: 3.6 g/dL (calc) (ref 1.9–3.7)
Indirect Bilirubin: 0.4 mg/dL (calc) (ref 0.2–1.2)
TOTAL PROTEIN: 7.6 g/dL (ref 6.1–8.1)

## 2017-02-13 NOTE — Progress Notes (Signed)
Presenting complaint;  Follow-up for common duct stones.  Database and Subjective:  Patient is [name] who has several year history of choledocholithiasis but declined to undergo therapy until last year when she developed frequent symptoms. She underwent ERCP in October 2017 with removal of multiple stones. She had second ERCP in December 2017 with removal of stent and remaining stones. She presented with obstructive jaundice in June 2018 and underwent ERCP at [facility]. She was found to have single large stone which could not be removed. Therefore bile duct was stented. She presented on 12/29/2016 with jaundice and signs and symptoms of cholangitis. She was on anticoagulant. She underwent emergency ERCP with removal of occluded stent and placement of a new stent.  Patient presents today for scheduled visit accompanied by her daughter and daughter-in-law. She has no complaints. She feels she has fully recovered from her recent illness. She denies nausea vomiting abdominal pain. Her appetite is good. She is not taking Urso. However she has not had any side effects.   Current Medications: Outpatient Encounter Prescriptions as of 02/13/2017  Medication Sig  . acetaminophen (TYLENOL) 325 MG tablet Take 325 mg by mouth every 6 (six) hours as needed for headache (pain).  Marland Kitchen apixaban (ELIQUIS) 2.5 MG TABS tablet Take 1 tablet (2.5 mg total) by mouth 2 (two) times daily.  . clotrimazole-betamethasone (LOTRISONE) cream Apply 1 application topically 2 (two) times daily as needed (rash).   Marland Kitchen diltiazem (CARDIZEM CD) 120 MG 24 hr capsule take 1 capsule by mouth once daily  . levothyroxine (SYNTHROID, LEVOTHROID) 100 MCG tablet Take 100 mcg by mouth daily.   . metoprolol tartrate (LOPRESSOR) 25 MG tablet take 1 tablet twice a day  . ursodiol (ACTIGALL) 250 MG tablet Take 1 tablet (250 mg total) by mouth 2 (two) times daily. (Patient not taking: Reported on 02/13/2017)   No facility-administered  encounter medications on file as of 02/13/2017.      Objective: Blood pressure 110/68, pulse 66, temperature (!) 97 F (36.1 C), temperature source Oral, resp. rate 18, height 5\' 4"  (1.626 m), weight 112 lb 14.4 oz (51.2 kg). Patient is alert and in no acute distress. Conjunctiva is pink. Sclera is nonicteric Oropharyngeal mucosa is normal. No neck masses or thyromegaly noted. Cardiac exam with irregular rhythm normal S1 and S2. No murmur or gallop noted. Lungs are clear to auscultation. Abdomen is soft and nontender without organomegaly or masses. No LE edema or clubbing noted.   Assessment:  #1. Choledocholithiasis. Patient has undergone multiple ERCPs with removal of stones. She has one large stone remaining in her bile duct. She has fully recovered from bout of cholangitis that she had about 7 weeks ago due to stent occlusion. Since she has single large stone she may need mechanical or laser lithotripsy.   Plan:  Patient will go to for LFTs. Will plan therapeutic ERCP in about 4 weeks. She will need to be off anticoagulant for 2 days.

## 2017-02-13 NOTE — Patient Instructions (Signed)
Physician will call with results of blood test. ERCP be scheduled in 3-4 weeks.

## 2017-02-14 ENCOUNTER — Other Ambulatory Visit (INDEPENDENT_AMBULATORY_CARE_PROVIDER_SITE_OTHER): Payer: Self-pay | Admitting: *Deleted

## 2017-02-14 ENCOUNTER — Encounter (INDEPENDENT_AMBULATORY_CARE_PROVIDER_SITE_OTHER): Payer: Self-pay | Admitting: *Deleted

## 2017-02-14 DIAGNOSIS — Z5181 Encounter for therapeutic drug level monitoring: Secondary | ICD-10-CM | POA: Diagnosis not present

## 2017-02-14 DIAGNOSIS — Z96642 Presence of left artificial hip joint: Secondary | ICD-10-CM | POA: Diagnosis not present

## 2017-02-14 DIAGNOSIS — Z8673 Personal history of transient ischemic attack (TIA), and cerebral infarction without residual deficits: Secondary | ICD-10-CM | POA: Diagnosis not present

## 2017-02-14 DIAGNOSIS — I10 Essential (primary) hypertension: Secondary | ICD-10-CM | POA: Diagnosis not present

## 2017-02-14 DIAGNOSIS — K8050 Calculus of bile duct without cholangitis or cholecystitis without obstruction: Secondary | ICD-10-CM

## 2017-02-14 DIAGNOSIS — I4891 Unspecified atrial fibrillation: Secondary | ICD-10-CM | POA: Diagnosis not present

## 2017-02-14 DIAGNOSIS — K8030 Calculus of bile duct with cholangitis, unspecified, without obstruction: Secondary | ICD-10-CM | POA: Diagnosis not present

## 2017-02-21 DIAGNOSIS — E785 Hyperlipidemia, unspecified: Secondary | ICD-10-CM | POA: Diagnosis not present

## 2017-02-21 DIAGNOSIS — I10 Essential (primary) hypertension: Secondary | ICD-10-CM | POA: Diagnosis not present

## 2017-02-21 DIAGNOSIS — I482 Chronic atrial fibrillation: Secondary | ICD-10-CM | POA: Diagnosis not present

## 2017-03-01 DIAGNOSIS — K8030 Calculus of bile duct with cholangitis, unspecified, without obstruction: Secondary | ICD-10-CM | POA: Diagnosis not present

## 2017-03-01 DIAGNOSIS — I10 Essential (primary) hypertension: Secondary | ICD-10-CM | POA: Diagnosis not present

## 2017-03-01 DIAGNOSIS — Z5181 Encounter for therapeutic drug level monitoring: Secondary | ICD-10-CM | POA: Diagnosis not present

## 2017-03-01 DIAGNOSIS — I4891 Unspecified atrial fibrillation: Secondary | ICD-10-CM | POA: Diagnosis not present

## 2017-03-01 DIAGNOSIS — Z96642 Presence of left artificial hip joint: Secondary | ICD-10-CM | POA: Diagnosis not present

## 2017-03-01 DIAGNOSIS — Z8673 Personal history of transient ischemic attack (TIA), and cerebral infarction without residual deficits: Secondary | ICD-10-CM | POA: Diagnosis not present

## 2017-03-01 NOTE — Patient Instructions (Signed)
HAZELL SIWIK  03/01/2017     @PREFPERIOPPHARMACY @   Your procedure is scheduled on 03/09/2017 .  Report to [facility] at  1115   A.M.  Call this number if you have problems the morning of surgery:  [phone]   Remember:  Do not eat food or drink liquids after midnight.  Take these medicines the morning of surgery with A SIP OF WATER cardiazem, levothyroxine, metoprolol. STOP your eliquis 2 days before your procedure. 03/06/2017 should be your last dose before your surgery.   Do not wear jewelry, make-up or nail polish.  Do not wear lotions, powders, or perfumes, or deoderant.  Do not shave 48 hours prior to surgery.  Men may shave face and neck.  Do not bring valuables to the hospital.  [facility] is not responsible for any belongings or valuables.  Contacts, dentures or bridgework may not be worn into surgery.  Leave your suitcase in the car.  After surgery it may be brought to your room.  For patients admitted to the hospital, discharge time will be determined by your treatment team.  Patients discharged the day of surgery will not be allowed to drive home.   Name and phone number of your driver:   Family Special instructions:  Follow the diet and prep instructions given to you by Dr Olevia Perches office.  Please read over the following fact sheets that you were given. Anesthesia Post-op Instructions and Care and Recovery After Surgery       Endoscopic Retrograde Cholangiopancreatogram Endoscopic retrograde cholangiopancreatogram (ERCP) is a procedure that may be used to diagnose or treat problems with the pancreas, bile ducts, liver, and gallbladder. For this procedure, a thin, lighted tube (endoscope) is passed through the mouth, the throat, and down into the areas being checked. The endoscope has a camera that allows the areas to be viewed. Dye is injected and then X-rays are taken to further study the areas. During ERCP, other procedures may also be  done to help diagnose or treat problems that are found. For example, stones can be removed, or a tissue sample can be taken out for testing (biopsy). Tell a health care provider about:  Any allergies you have.  All medicines you are taking, including vitamins, herbs, eye drops, creams, and over-the-counter medicines.  Any problems you or family members have had with anesthetic medicines.  Any blood disorders you have.  Any surgeries you have had.  Any medical conditions you have.  Whether you are pregnant or may be pregnant. What are the risks? Generally, this is a safe procedure. However, problems may occur, including:  Pancreatitis.  Infection.  Bleeding.  Allergic reactions to medicines or dyes.  Accidental punctures in the bowel wall, pancreas, or gallbladder.  Damage to other structures or organs.  What happens before the procedure? Staying hydrated Follow instructions from your health care provider about hydration, which may include:  Up to 2 hours before the procedure - you may continue to drink clear liquids, such as water, clear fruit juice, black coffee, and plain tea.  Eating and drinking restrictions Follow instructions from your health care provider about eating and drinking, which may include:  8 hours before the procedure - stop eating heavy meals or foods such as meat, fried foods, or fatty foods.  6 hours before the procedure - stop eating light meals or foods, such as toast or cereal.  6 hours before the procedure - stop  drinking milk or drinks that contain milk.  2 hours before the procedure - stop drinking clear liquids.  General instructions  Ask your health care provider about: ? Changing or stopping your regular medicines. This is especially important if you are taking diabetes medicines or blood thinners. ? Taking medicines such as aspirin and ibuprofen. These medicines can thin your blood. Do not take these medicines before your procedure  if your health care provider instructs you not to.  Plan to have someone take you home from the hospital or clinic.  If you will be going home right after the procedure, plan to have someone with you for 24 hours. What happens during the procedure?  To lower your risk of infection, your health care team will wash or sanitize their hands.  An IV tube will be inserted into one of your veins.  You will be given one or more of the following: ? A medicine to help you relax (sedative). ? A medicine to numb the throat area (local anesthetic) and prevent gagging. Your throat may be sprayed with this medicine, or you may gargle the medicine. ? A medicine to make you fall asleep (general anesthetic). ? A medicine to lower your risk of infection (antibiotic), inflammation (anti-inflammatory), or both.  You will lie on your left side.  The endoscope will be inserted through your mouth, down the back of the throat, and into the first part of the small intestine (duodenum).  Then a small, plastic tube (cannula) will be passed through the endoscope and directed into the bile duct or pancreatic duct.  Dye will be injected through the cannula to make structures easier to see on an X-ray.  X-rays will be taken to study the biliary and pancreatic passageways. You may be positioned on your abdomen or your back during the X-rays.  A small sample of tissue (biopsy) may be removed for examination, or other procedures may be done to fix problems that are found. The procedure may vary among health care providers and hospitals. What happens after the procedure?  Your blood pressure, heart rate, breathing rate, and blood oxygen level will be monitored until the medicines you were given have worn off.  Your throat may feel slightly sore.  You will not be allowed to eat or drink until numbness subsides.  Do not drive for 24 hours if you were given a sedative. Summary  Endoscopic retrograde  cholangiopancreatogram is a procedure that may be used to diagnose or treat problems with the pancreas, bile ducts, liver, and gallbladder.  During ERCP, other procedures may also be done to help diagnose or treat problems that are found. For example, stones can be removed, or a tissue sample can be taken out for testing (biopsy).  Generally, this is a safe procedure. However, problems may occur, including infection, bleeding, pancreatitis, accidental damage to other structures or organs, and allergic reactions to medicines or dyes.  The procedure may vary among health care providers and hospitals. This information is not intended to replace advice given to you by your health care provider. Make sure you discuss any questions you have with your health care provider. Document Released: 02/14/2001 Document Revised: 04/17/2016 Document Reviewed: 04/17/2016 Elsevier Interactive Patient Education  2017 [employer]  Endoscopic Retrograde Cholangiopancreatogram, Care After This sheet gives you information about how to care for yourself after your procedure. Your health care provider may also give you more specific instructions. If you have problems or questions, contact your health care provider. What  can I expect after the procedure? After the procedure, it is common to have:  Soreness in your throat.  Nausea.  Bloating.  Dizziness.  Tiredness (fatigue).  Follow these instructions at home:  Take over-the-counter and prescription medicines only as told by your health care provider.  Do not drive for 24 hours if you were given a medicine to help you relax (sedative) during your procedure. Have someone stay with you for 24 hours after the procedure.  Return to your normal activities as told by your health care provider. Ask your health care provider what activities are safe for you.  Return to eating what you normally do as soon as you feel well enough or as told by your health care  provider.  Keep all follow-up visits as told by your health care provider. This is important. Contact a health care provider if:  You have pain in your abdomen that does not get better with medicine.  You develop signs of infection, such as: ? Chills. ? Feeling unwell. Get help right away if:  You have difficulty swallowing.  You have worsening pain in your throat, chest, or abdomen.  You vomit bright red blood or a substance that looks like coffee grounds.  You have bloody or very black stools.  You have a fever.  You have a sudden increase in swelling (bloating) in your abdomen. Summary  After the procedure, it is common to feel tired and to have some discomfort in your throat.  Contact your health care provider if you have signs of infection-such as chills or feeling unwell-or if you have pain that does not improve with medicine.  Get help right away if you have trouble swallowing, worsening pain, bloody or black vomit, bloody or black stools, a fever, or increased swelling in your abdomen.  Keep all follow-up visits as told by your health care provider. This is important. This information is not intended to replace advice given to you by your health care provider. Make sure you discuss any questions you have with your health care provider. Document Released: 03/12/2013 Document Revised: 04/10/2016 Document Reviewed: 04/10/2016 Elsevier Interactive Patient Education  2017 [employer] Anesthesia, Adult General anesthesia is the use of medicines to make a person "go to sleep" (be unconscious) for a medical procedure. General anesthesia is often recommended when a procedure:  Is long.  Requires you to be still or in an unusual position.  Is major and can cause you to lose blood.  Is impossible to do without general anesthesia.  The medicines used for general anesthesia are called general anesthetics. In addition to making you sleep, the medicines:  Prevent  pain.  Control your blood pressure.  Relax your muscles.  Tell a health care provider about:  Any allergies you have.  All medicines you are taking, including vitamins, herbs, eye drops, creams, and over-the-counter medicines.  Any problems you or family members have had with anesthetic medicines.  Types of anesthetics you have had in the past.  Any bleeding disorders you have.  Any surgeries you have had.  Any medical conditions you have.  Any history of heart or lung conditions, such as heart failure, sleep apnea, or chronic obstructive pulmonary disease (COPD).  Whether you are pregnant or may be pregnant.  Whether you use tobacco, alcohol, marijuana, or street drugs.  Any history of Armed forces logistics/support/administrative officer.  Any history of depression or anxiety. What are the risks? Generally, this is a safe procedure. However, problems may occur,  including:  Allergic reaction to anesthetics.  Lung and heart problems.  Inhaling food or liquids from your stomach into your lungs (aspiration).  Injury to nerves.  Waking up during your procedure and being unable to move (rare).  Extreme agitation or a state of mental confusion (delirium) when you wake up from the anesthetic.  Air in the bloodstream, which can lead to stroke.  These problems are more likely to develop if you are having a major surgery or if you have an advanced medical condition. You can prevent some of these complications by answering all of your health care provider's questions thoroughly and by following all pre-procedure instructions. General anesthesia can cause side effects, including:  Nausea or vomiting  A sore throat from the breathing tube.  Feeling cold or shivery.  Feeling tired, washed out, or achy.  Sleepiness or drowsiness.  Confusion or agitation.  What happens before the procedure? Staying hydrated Follow instructions from your health care provider about hydration, which may include:  Up to 2  hours before the procedure - you may continue to drink clear liquids, such as water, clear fruit juice, black coffee, and plain tea.  Eating and drinking restrictions Follow instructions from your health care provider about eating and drinking, which may include:  8 hours before the procedure - stop eating heavy meals or foods such as meat, fried foods, or fatty foods.  6 hours before the procedure - stop eating light meals or foods, such as toast or cereal.  6 hours before the procedure - stop drinking milk or drinks that contain milk.  2 hours before the procedure - stop drinking clear liquids.  Medicines  Ask your health care provider about: ? Changing or stopping your regular medicines. This is especially important if you are taking diabetes medicines or blood thinners. ? Taking medicines such as aspirin and ibuprofen. These medicines can thin your blood. Do not take these medicines before your procedure if your health care provider instructs you not to. ? Taking new dietary supplements or medicines. Do not take these during the week before your procedure unless your health care provider approves them.  If you are told to take a medicine or to continue taking a medicine on the day of the procedure, take the medicine with sips of water. General instructions   Ask if you will be going home the same day, the following day, or after a longer hospital stay. ? Plan to have someone take you home. ? Plan to have someone stay with you for the first 24 hours after you leave the hospital or clinic.  For 3-6 weeks before the procedure, try not to use any tobacco products, such as cigarettes, chewing tobacco, and e-cigarettes.  You may brush your teeth on the morning of the procedure, but make sure to spit out the toothpaste. What happens during the procedure?  You will be given anesthetics through a mask and through an IV tube in one of your veins.  You may receive medicine to help you  relax (sedative).  As soon as you are asleep, a breathing tube may be used to help you breathe.  An anesthesia specialist will stay with you throughout the procedure. He or she will help keep you comfortable and safe by continuing to give you medicines and adjusting the amount of medicine that you get. He or she will also watch your blood pressure, pulse, and oxygen levels to make sure that the anesthetics do not cause any problems.  If a breathing tube was used to help you breathe, it will be removed before you wake up. The procedure may vary among health care providers and hospitals. What happens after the procedure?  You will wake up, often slowly, after the procedure is complete, usually in a recovery area.  Your blood pressure, heart rate, breathing rate, and blood oxygen level will be monitored until the medicines you were given have worn off.  You may be given medicine to help you calm down if you feel anxious or agitated.  If you will be going home the same day, your health care provider may check to make sure you can stand, drink, and urinate.  Your health care providers will treat your pain and side effects before you go home.  Do not drive for 24 hours if you received a sedative.  You may: ? Feel nauseous and vomit. ? Have a sore throat. ? Have mental slowness. ? Feel cold or shivery. ? Feel sleepy. ? Feel tired. ? Feel sore or achy, even in parts of your body where you did not have surgery. This information is not intended to replace advice given to you by your health care provider. Make sure you discuss any questions you have with your health care provider. Document Released: 08/29/2007 Document Revised: 11/02/2015 Document Reviewed: 05/06/2015 Elsevier Interactive Patient Education  2018 [employer] Anesthesia, Adult, Care After These instructions provide you with information about caring for yourself after your procedure. Your health care provider may also  give you more specific instructions. Your treatment has been planned according to current medical practices, but problems sometimes occur. Call your health care provider if you have any problems or questions after your procedure. What can I expect after the procedure? After the procedure, it is common to have:  Vomiting.  A sore throat.  Mental slowness.  It is common to feel:  Nauseous.  Cold or shivery.  Sleepy.  Tired.  Sore or achy, even in parts of your body where you did not have surgery.  Follow these instructions at home: For at least 24 hours after the procedure:  Do not: ? Participate in activities where you could fall or become injured. ? Drive. ? Use heavy machinery. ? Drink alcohol. ? Take sleeping pills or medicines that cause drowsiness. ? Make important decisions or sign legal documents. ? Take care of children on your own.  Rest. Eating and drinking  If you vomit, drink water, juice, or soup when you can drink without vomiting.  Drink enough fluid to keep your urine clear or pale yellow.  Make sure you have little or no nausea before eating solid foods.  Follow the diet recommended by your health care provider. General instructions  Have a responsible adult stay with you until you are awake and alert.  Return to your normal activities as told by your health care provider. Ask your health care provider what activities are safe for you.  Take over-the-counter and prescription medicines only as told by your health care provider.  If you smoke, do not smoke without supervision.  Keep all follow-up visits as told by your health care provider. This is important. Contact a health care provider if:  You continue to have nausea or vomiting at home, and medicines are not helpful.  You cannot drink fluids or start eating again.  You cannot urinate after 8-12 hours.  You develop a skin rash.  You have fever.  You have increasing redness at the  site of your procedure. Get help right away if:  You have difficulty breathing.  You have chest pain.  You have unexpected bleeding.  You feel that you are having a life-threatening or urgent problem. This information is not intended to replace advice given to you by your health care provider. Make sure you discuss any questions you have with your health care provider. Document Released: 08/28/2000 Document Revised: 10/25/2015 Document Reviewed: 05/06/2015 Elsevier Interactive Patient Education  [employer].

## 2017-03-05 ENCOUNTER — Encounter (HOSPITAL_COMMUNITY)
Admission: RE | Admit: 2017-03-05 | Discharge: 2017-03-05 | Disposition: A | Discharge status: Home / Self Care | Payer: Medicare Other | Source: Ambulatory Visit | Attending: Internal Medicine | Admitting: Internal Medicine

## 2017-03-05 ENCOUNTER — Encounter (HOSPITAL_COMMUNITY): Payer: Self-pay

## 2017-03-05 DIAGNOSIS — Z01812 Encounter for preprocedural laboratory examination: Secondary | ICD-10-CM | POA: Insufficient documentation

## 2017-03-05 DIAGNOSIS — K8050 Calculus of bile duct without cholangitis or cholecystitis without obstruction: Secondary | ICD-10-CM | POA: Diagnosis not present

## 2017-03-05 LAB — CBC WITH DIFFERENTIAL/PLATELET
BASOS PCT: 0 %
Basophils Absolute: 0 10*3/uL (ref 0.0–0.1)
EOS ABS: 0.2 10*3/uL (ref 0.0–0.7)
EOS PCT: 3 %
HCT: 42.1 % (ref 36.0–46.0)
Hemoglobin: 14 g/dL (ref 12.0–15.0)
LYMPHS ABS: 1.8 10*3/uL (ref 0.7–4.0)
Lymphocytes Relative: 24 %
MCH: 29.4 pg (ref 26.0–34.0)
MCHC: 33.3 g/dL (ref 30.0–36.0)
MCV: 88.4 fL (ref 78.0–100.0)
Monocytes Absolute: 0.6 10*3/uL (ref 0.1–1.0)
Monocytes Relative: 9 %
NEUTROS PCT: 64 %
Neutro Abs: 4.8 10*3/uL (ref 1.7–7.7)
PLATELETS: 195 10*3/uL (ref 150–400)
RBC: 4.76 MIL/uL (ref 3.87–5.11)
RDW: 13.8 % (ref 11.5–15.5)
WBC: 7.5 10*3/uL (ref 4.0–10.5)

## 2017-03-05 LAB — BASIC METABOLIC PANEL
Anion gap: 8 (ref 5–15)
BUN: 21 mg/dL — AB (ref 6–20)
CO2: 26 mmol/L (ref 22–32)
CREATININE: 1 mg/dL (ref 0.44–1.00)
Calcium: 9.1 mg/dL (ref 8.9–10.3)
Chloride: 101 mmol/L (ref 101–111)
GFR, EST AFRICAN AMERICAN: 55 mL/min — AB (ref >60)
GFR, EST NON AFRICAN AMERICAN: 47 mL/min — AB (ref >60)
Glucose, Bld: 114 mg/dL — ABNORMAL HIGH (ref 65–99)
POTASSIUM: 4.6 mmol/L (ref 3.5–5.1)
SODIUM: 135 mmol/L (ref 135–145)

## 2017-03-09 ENCOUNTER — Ambulatory Visit (HOSPITAL_COMMUNITY): Payer: Medicare Other

## 2017-03-09 ENCOUNTER — Ambulatory Visit (HOSPITAL_COMMUNITY): Payer: Medicare Other | Admitting: Anesthesiology

## 2017-03-09 ENCOUNTER — Encounter (HOSPITAL_COMMUNITY)
Admission: RE | Disposition: A | Discharge status: Home / Self Care | Payer: Self-pay | Source: Ambulatory Visit | Attending: Internal Medicine

## 2017-03-09 ENCOUNTER — Ambulatory Visit (HOSPITAL_COMMUNITY)
Admission: RE | Admit: 2017-03-09 | Discharge: 2017-03-09 | Disposition: A | Discharge status: Home / Self Care | Payer: Medicare Other | Source: Ambulatory Visit | Attending: Internal Medicine | Admitting: Internal Medicine

## 2017-03-09 ENCOUNTER — Encounter (HOSPITAL_COMMUNITY): Payer: Self-pay | Admitting: *Deleted

## 2017-03-09 DIAGNOSIS — Z8249 Family history of ischemic heart disease and other diseases of the circulatory system: Secondary | ICD-10-CM | POA: Insufficient documentation

## 2017-03-09 DIAGNOSIS — K449 Diaphragmatic hernia without obstruction or gangrene: Secondary | ICD-10-CM | POA: Diagnosis not present

## 2017-03-09 DIAGNOSIS — E039 Hypothyroidism, unspecified: Secondary | ICD-10-CM | POA: Insufficient documentation

## 2017-03-09 DIAGNOSIS — Z96642 Presence of left artificial hip joint: Secondary | ICD-10-CM | POA: Diagnosis not present

## 2017-03-09 DIAGNOSIS — D649 Anemia, unspecified: Secondary | ICD-10-CM | POA: Diagnosis not present

## 2017-03-09 DIAGNOSIS — Z809 Family history of malignant neoplasm, unspecified: Secondary | ICD-10-CM | POA: Insufficient documentation

## 2017-03-09 DIAGNOSIS — K8031 Calculus of bile duct with cholangitis, unspecified, with obstruction: Secondary | ICD-10-CM | POA: Diagnosis not present

## 2017-03-09 DIAGNOSIS — M1990 Unspecified osteoarthritis, unspecified site: Secondary | ICD-10-CM | POA: Insufficient documentation

## 2017-03-09 DIAGNOSIS — K838 Other specified diseases of biliary tract: Secondary | ICD-10-CM | POA: Insufficient documentation

## 2017-03-09 DIAGNOSIS — I10 Essential (primary) hypertension: Secondary | ICD-10-CM | POA: Diagnosis not present

## 2017-03-09 DIAGNOSIS — Z8673 Personal history of transient ischemic attack (TIA), and cerebral infarction without residual deficits: Secondary | ICD-10-CM | POA: Diagnosis not present

## 2017-03-09 DIAGNOSIS — Z7901 Long term (current) use of anticoagulants: Secondary | ICD-10-CM | POA: Diagnosis not present

## 2017-03-09 DIAGNOSIS — K8050 Calculus of bile duct without cholangitis or cholecystitis without obstruction: Secondary | ICD-10-CM

## 2017-03-09 DIAGNOSIS — Z79899 Other long term (current) drug therapy: Secondary | ICD-10-CM | POA: Diagnosis not present

## 2017-03-09 DIAGNOSIS — I4891 Unspecified atrial fibrillation: Secondary | ICD-10-CM | POA: Diagnosis not present

## 2017-03-09 DIAGNOSIS — Z9889 Other specified postprocedural states: Secondary | ICD-10-CM | POA: Diagnosis not present

## 2017-03-09 DIAGNOSIS — K839 Disease of biliary tract, unspecified: Secondary | ICD-10-CM | POA: Diagnosis not present

## 2017-03-09 DIAGNOSIS — K831 Obstruction of bile duct: Secondary | ICD-10-CM | POA: Diagnosis not present

## 2017-03-09 DIAGNOSIS — R8589 Other abnormal findings in specimens from digestive organs and abdominal cavity: Secondary | ICD-10-CM | POA: Diagnosis not present

## 2017-03-09 DIAGNOSIS — Z4589 Encounter for adjustment and management of other implanted devices: Secondary | ICD-10-CM | POA: Insufficient documentation

## 2017-03-09 DIAGNOSIS — K8051 Calculus of bile duct without cholangitis or cholecystitis with obstruction: Secondary | ICD-10-CM | POA: Diagnosis not present

## 2017-03-09 HISTORY — PX: GASTROINTESTINAL STENT REMOVAL: SHX6384

## 2017-03-09 HISTORY — PX: SPYGLASS CHOLANGIOSCOPY: SHX5441

## 2017-03-09 HISTORY — PX: ERCP: SHX5425

## 2017-03-09 SURGERY — ERCP, WITH INTERVENTION IF INDICATED
Anesthesia: General

## 2017-03-09 MED ORDER — ROCURONIUM 10MG/ML (10ML) SYRINGE FOR MEDFUSION PUMP - OPTIME
INTRAVENOUS | Status: DC | PRN
  Administered 2017-03-09: 20 mg via INTRAVENOUS

## 2017-03-09 MED ORDER — FENTANYL CITRATE (PF) 100 MCG/2ML IJ SOLN
INTRAMUSCULAR | Status: AC
  Filled 2017-03-09: qty 2

## 2017-03-09 MED ORDER — LIDOCAINE HCL (CARDIAC) 10 MG/ML IV SOLN
INTRAVENOUS | Status: DC | PRN
  Administered 2017-03-09: 30 mg via INTRAVENOUS

## 2017-03-09 MED ORDER — IOPAMIDOL (ISOVUE-300) INJECTION 61%
INTRAVENOUS | Status: AC
  Filled 2017-03-09: qty 50

## 2017-03-09 MED ORDER — EPHEDRINE SULFATE 50 MG/ML IJ SOLN
INTRAMUSCULAR | Status: AC
  Filled 2017-03-09: qty 1

## 2017-03-09 MED ORDER — FENTANYL CITRATE (PF) 100 MCG/2ML IJ SOLN: 25.0000 ug | INTRAMUSCULAR | Status: DC | PRN

## 2017-03-09 MED ORDER — LIDOCAINE HCL (PF) 1 % IJ SOLN
INTRAMUSCULAR | Status: AC
  Filled 2017-03-09: qty 5

## 2017-03-09 MED ORDER — IOPAMIDOL (ISOVUE-M 300) INJECTION 61%
INTRAMUSCULAR | Status: DC | PRN
  Administered 2017-03-09: 10 mL via INTRATHECAL

## 2017-03-09 MED ORDER — PROPOFOL 10 MG/ML IV BOLUS
INTRAVENOUS | Status: AC
Start: 2017-03-09 — End: 2017-03-09
  Filled 2017-03-09: qty 20

## 2017-03-09 MED ORDER — GLUCAGON HCL RDNA (DIAGNOSTIC) 1 MG IJ SOLR
INTRAMUSCULAR | Status: DC | PRN
  Administered 2017-03-09: 0.25 mg via INTRAVENOUS

## 2017-03-09 MED ORDER — EPHEDRINE SULFATE 50 MG/ML IJ SOLN
INTRAMUSCULAR | Status: DC | PRN
  Administered 2017-03-09: 10 mg via INTRAVENOUS
  Administered 2017-03-09 (×2): 5 mg via INTRAVENOUS

## 2017-03-09 MED ORDER — GLUCAGON HCL RDNA (DIAGNOSTIC) 1 MG IJ SOLR
INTRAMUSCULAR | Status: AC
  Filled 2017-03-09: qty 2

## 2017-03-09 MED ORDER — ETOMIDATE 2 MG/ML IV SOLN
INTRAVENOUS | Status: AC
  Filled 2017-03-09: qty 10

## 2017-03-09 MED ORDER — ONDANSETRON HCL 4 MG/2ML IJ SOLN
INTRAMUSCULAR | Status: AC
  Filled 2017-03-09: qty 2

## 2017-03-09 MED ORDER — SODIUM CHLORIDE 0.9 % IJ SOLN
INTRAMUSCULAR | Status: AC
  Filled 2017-03-09: qty 10

## 2017-03-09 MED ORDER — SUCCINYLCHOLINE CHLORIDE 20 MG/ML IJ SOLN
INTRAMUSCULAR | Status: AC
  Filled 2017-03-09: qty 1

## 2017-03-09 MED ORDER — MIDAZOLAM HCL 2 MG/2ML IJ SOLN
1.0000 mg | INTRAMUSCULAR | Status: AC
  Administered 2017-03-09: 2 mg via INTRAVENOUS

## 2017-03-09 MED ORDER — CHLORHEXIDINE GLUCONATE CLOTH 2 % EX PADS: 6.0000 | MEDICATED_PAD | Freq: Once | CUTANEOUS | Status: DC

## 2017-03-09 MED ORDER — CEFAZOLIN SODIUM-DEXTROSE 2-4 GM/100ML-% IV SOLN
2.0000 g | INTRAVENOUS | Status: DC
  Administered 2017-03-09: 2 g via INTRAVENOUS
  Filled 2017-03-09: qty 100

## 2017-03-09 MED ORDER — NEOSTIGMINE METHYLSULFATE 10 MG/10ML IV SOLN
INTRAVENOUS | Status: DC | PRN
  Administered 2017-03-09: 2 mg via INTRAVENOUS

## 2017-03-09 MED ORDER — GLYCOPYRROLATE 0.2 MG/ML IJ SOLN
INTRAMUSCULAR | Status: DC | PRN
  Administered 2017-03-09: .3 mg via INTRAVENOUS

## 2017-03-09 MED ORDER — FENTANYL CITRATE (PF) 100 MCG/2ML IJ SOLN
INTRAMUSCULAR | Status: DC | PRN
  Administered 2017-03-09 (×2): 25 ug via INTRAVENOUS
  Administered 2017-03-09: 50 ug via INTRAVENOUS

## 2017-03-09 MED ORDER — SUCCINYLCHOLINE 20MG/ML (10ML) SYRINGE FOR MEDFUSION PUMP - OPTIME
INTRAMUSCULAR | Status: DC | PRN
  Administered 2017-03-09: 80 mg via INTRAVENOUS

## 2017-03-09 MED ORDER — STERILE WATER FOR IRRIGATION IR SOLN
Status: DC | PRN
  Administered 2017-03-09: 2 mL

## 2017-03-09 MED ORDER — ETOMIDATE 2 MG/ML IV SOLN
INTRAVENOUS | Status: DC | PRN
  Administered 2017-03-09: 8 mg via INTRAVENOUS

## 2017-03-09 MED ORDER — LACTATED RINGERS IV SOLN
INTRAVENOUS | Status: DC
  Administered 2017-03-09 (×2): via INTRAVENOUS

## 2017-03-09 MED ORDER — ROCURONIUM BROMIDE 50 MG/5ML IV SOLN
INTRAVENOUS | Status: AC
  Filled 2017-03-09: qty 1

## 2017-03-09 MED ORDER — ONDANSETRON HCL 4 MG/2ML IJ SOLN
INTRAMUSCULAR | Status: DC | PRN
  Administered 2017-03-09: 4 mg via INTRAVENOUS

## 2017-03-09 MED ORDER — MIDAZOLAM HCL 2 MG/2ML IJ SOLN
INTRAMUSCULAR | Status: AC
  Filled 2017-03-09: qty 2

## 2017-03-09 NOTE — Op Note (Signed)
Regina [name], Regina [name]              ACCOUNT NO.:  [id number]  MEDICAL RECORD NO.:  [id number]  LOCATION:                                FACILITY:  [facility]  PHYSICIAN:  Hildred Laser, M.D.    DATE OF BIRTH:  [birth year]  DATE OF PROCEDURE:  03/09/2017 DATE OF DISCHARGE:  03/09/2017                              OPERATIVE REPORT   PROCEDURE:  ERCP with Spyglass examination, stent removal, and placement of a fully covered Wallstent.  INDICATIONS:  [name] is a [name] with known choledocholithiasis.  She has undergone multiple ERCPs.  She was brought back for stent and stone removal.  Procedure risks were reviewed with the patient and her 2 children and daughter-in-law, and informed consent was obtained.  All of their questions were answered.  MEDICATIONS:  General endotracheal anesthesia.  COMPLICATIONS:  No immediate complications.  ESTIMATED BLOOD LOSS:  None.  DESCRIPTION OF PROCEDURE:  After induction of general endotracheal anesthesia, the patient was placed in semiprone position.  Therapeutic Pentax videoscope was passed via oropharynx into esophagus and stomach. The patient has large hiatal hernia.  Passage of the scope into pylorus and second part of duodenum was difficult.  Plastic stent was in place. It was patent.  It was caught with a polypectomy snare and removed through the scope channel.  CBD was deep cannulated with Rx 44 Autotome with 035 Hydra Jagwire.  Biliary system was dilated.  CBD and CHD were markedly dilated.  Cystic duct was partially filled and appeared to be large as well.  There were at least 2 small filling defects in common bile duct.  Left intrahepatic system was filled and dilated.  Right system was not filled.  There was distal stricture.  Stone balloon extractor was passed to the level of bifurcation.  It was insufflated to a diameter around 10.  It was passed with some difficulty across the stricture.  At this point, Spyglass  examination was performed.  Spyglass was introduced into the bile duct without any difficulty to the level of bifurcation.  Common hepatic duct and bile duct mucosa were normal.  Few small free-floating stones were noted.  Stricture was noted in distal bile duct with a friable erythematous nodular mucosa.  Pictures were taken with an iPhone.  Microbiopsies were taken from this lesion. Spyglass examination was concluded, and Spyglass was withdrawn.  035 guidewire was left in place.  It was decided to place a Wallstent. Fully covered 10 mm x 60 mm Wallstent by [employer] was placed across the stricture.  Position was satisfactory.  Endoscope was withdrawn.  The patient tolerated the procedure well.  She is in the process of being extubated.  Reference number is [id number].  Lot number [id number].  Please note that liquid that was aspirated from the bile duct was also sent for cytology.  FINAL DIAGNOSES: 1. Plastic biliary stent removed. 2. 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  Multiple small stones were noted but not 1     large stone. 3. Spyglass examination performed, and biopsies taken from the     abnormal mucosa at stricture. 4. Fully covered 10  mm x 60 mm WallFlex stent was placed across the     stricture.  RECOMMENDATIONS:  Clear liquids today.  The patient will resume usual diet starting tomorrow.  The patient will resume her other medications as before.  She will resume Eliquis on March 11, 2017.  I will be contacting the patient's family members with biopsy results.  We will plan office visit in 8 weeks.          ______________________________ Hildred Laser, M.D.     NR/MEDQ  D:  03/09/2017  T:  03/09/2017  Job:  [id number]

## 2017-03-09 NOTE — Progress Notes (Signed)
ERCP note dictated.  [id number]  Findings:   plastic biliary stent removed. Distal CBD stricture with dilated biliary system upstream with few small filling defects consistent with stones. Spyglass examination performed in biopsy taken from stricture. Fuuly covered 10 mm x 60 mm Wallstent placed for decompression Patient tolerated the procedure well.

## 2017-03-09 NOTE — Anesthesia Procedure Notes (Signed)
Procedure Name: Intubation Date/Time: 03/09/2017 1:04 PM Performed by: Tressie Stalker E Pre-anesthesia Checklist: Patient identified, Patient being monitored, Timeout performed, Emergency Drugs available and Suction available Patient Re-evaluated:Patient Re-evaluated prior to induction Oxygen Delivery Method: Circle system utilized Preoxygenation: Pre-oxygenation with 100% oxygen Induction Type: IV induction Ventilation: Mask ventilation without difficulty Laryngoscope Size: Mac and 3 Grade View: Grade I Tube type: Oral Tube size: 7.0 mm Number of attempts: 1 Airway Equipment and Method: Stylet Placement Confirmation: ETT inserted through vocal cords under direct vision,  positive ETCO2 and breath sounds checked- equal and bilateral Secured at: 21 cm Tube secured with: Tape Dental Injury: Teeth and Oropharynx as per pre-operative assessment  Comments: Decayed tooth, upper right incisor.  Oral bite blocked placed Left of tooth.

## 2017-03-09 NOTE — Anesthesia Postprocedure Evaluation (Signed)
Anesthesia Post Note  Patient: Regina [name]  Procedure(s) Performed: ENDOSCOPIC RETROGRADE CHOLANGIOPANCREATOGRAPHY (ERCP) (N/A ) SPYGLASS CHOLANGIOSCOPY (N/A ) BILARY STENT REMOVAL, BILIARY STENT PLACEMENT (N/A )  Patient location during evaluation: PACU Anesthesia Type: General Level of consciousness: awake and alert and oriented Pain management: pain level controlled Vital Signs Assessment: post-procedure vital signs reviewed and stable Respiratory status: spontaneous breathing Cardiovascular status: blood pressure returned to baseline and stable : Burping a little, not really nauseated, but being discharged, therefore zofran, 4 mg given IV. Anesthetic complications: no     Last Vitals:  Vitals:   03/09/17 1530 03/09/17 1542  BP: 129/76 (!) 145/73  Pulse: (!) 50 75  Resp: 17 (!) 23  Temp:    SpO2: 97% 95%    Last Pain:  Vitals:   03/09/17 1129  TempSrc: Oral                 Vedha Tercero

## 2017-03-09 NOTE — Discharge Instructions (Signed)
Resume Apixaban on 03/11/2017. Resume other medications as before. Clear liquids today. Usual diet starting tomorrow morning. Physician will call with results of biopsy and cytology next week. Office visit in 8 weeks.       PATIENT INSTRUCTIONS POST-ANESTHESIA  IMMEDIATELY FOLLOWING SURGERY:  Do not drive or operate machinery for the first twenty four hours after surgery.  Do not make any important decisions for twenty four hours after surgery or while taking narcotic pain medications or sedatives.  If you develop intractable nausea and vomiting or a severe headache please notify your doctor immediately.  FOLLOW-UP:  Please make an appointment with your surgeon as instructed. You do not need to follow up with anesthesia unless specifically instructed to do so.  WOUND CARE INSTRUCTIONS (if applicable):  Keep a dry clean dressing on the anesthesia/puncture wound site if there is drainage.  Once the wound has quit draining you may leave it open to air.  Generally you should leave the bandage intact for twenty four hours unless there is drainage.  If the epidural site drains for more than 36-48 hours please call the anesthesia department.  QUESTIONS?:  Please feel free to call your physician or the hospital operator if you have any questions, and they will be happy to assist you.       Endoscopic Retrograde Cholangiopancreatogram, Care After This sheet gives you information about how to care for yourself after your procedure. Your health care provider may also give you more specific instructions. If you have problems or questions, contact your health care provider. What can I expect after the procedure? After the procedure, it is common to have:  Soreness in your throat.  Nausea.  Bloating.  Dizziness.  Tiredness (fatigue).  Follow these instructions at home:  Take over-the-counter and prescription medicines only as told by your health care provider.  Do not drive for 24 hours  if you were given a medicine to help you relax (sedative) during your procedure. Have someone stay with you for 24 hours after the procedure.  Return to your normal activities as told by your health care provider. Ask your health care provider what activities are safe for you.  Return to eating what you normally do as soon as you feel well enough or as told by your health care provider.  Keep all follow-up visits as told by your health care provider. This is important. Contact a health care provider if:  You have pain in your abdomen that does not get better with medicine.  You develop signs of infection, such as: ? Chills. ? Feeling unwell. Get help right away if:  You have difficulty swallowing.  You have worsening pain in your throat, chest, or abdomen.  You vomit bright red blood or a substance that looks like coffee grounds.  You have bloody or very black stools.  You have a fever.  You have a sudden increase in swelling (bloating) in your abdomen. Summary  After the procedure, it is common to feel tired and to have some discomfort in your throat.  Contact your health care provider if you have signs of infection--such as chills or feeling unwell--or if you have pain that does not improve with medicine.  Get help right away if you have trouble swallowing, worsening pain, bloody or black vomit, bloody or black stools, a fever, or increased swelling in your abdomen.  Keep all follow-up visits as told by your health care provider. This is important. This information is not intended to replace  advice given to you by your health care provider. Make sure you discuss any questions you have with your health care provider. Document Released: 03/12/2013 Document Revised: 04/10/2016 Document Reviewed: 04/10/2016 Elsevier Interactive Patient Education  2017 [employer].

## 2017-03-09 NOTE — H&P (Addendum)
Regina [name] is an 83 y.o. female.   Chief Complaint: patient is here for ERCP for stent and stone removal. HPI: atient is [name] who has several year history of choledocholithiasis.She elected to wait until last ye she became ymptomatic.Marland Kitchen She had multiple stones removed in October and December 2017.I felt her duct was cleared of all She present obstructive jaundice/cholangitis in June 2018 and she was treated at [facility]. He had single large stonich could not be removed. Stent was placed. She presented to this facility with signs and symptoms of cholangitis on 12/29/2016. She underwent emergency ERCP. Her stent was completely occluded. Patient was on anticoagulants. Therefore stent exchange was performed. She has done fine since then. She is here to have stent and stone removed. She feels fine. She has good appetite. She denies abdominal pain. She has been off anticoagulant for 4 days.  Past Medical History:  Diagnosis Date  . Anemia   . Arthritis   . Avascular necrosis of bone of left hip ([place])   . Dysrhythmia 4/14   atrial fib post op  . H/O hiatal hernia   . History of blood transfusion   . Hypertension   . Hypothyroidism   . Stroke [facility]) 2003   No deficits    Past Surgical History:  Procedure Laterality Date  . BALLOON DILATION N/A 05/05/2016   Procedure: BALLOON DILATION OF SPHINCTEROTOMY;  Surgeon: Rogene Houston, MD;  Location: [facility];  Service: Endoscopy;  Laterality: N/A;  . ERCP N/A 03/31/2016   Procedure: ENDOSCOPIC RETROGRADE CHOLANGIOPANCREATOGRAPHY (ERCP);  Surgeon: Rogene Houston, MD;  [facility];  Service: Endoscopy;  Laterality: N/A;  . ERCP N/A 05/05/2016   Procedure: ENDOSCOPIC RETROGRADE CHOLANGIOPANCREATOGRAPHY (ERCP);  Surgeon: Rogene Houston, MD;  [facility];  Service: Endoscopy;  Laterality: N/A;  do NOT move per [facility]  . ERCP N/A 11/11/2016   Procedure: ENDOSCOPIC RETROGRADE CHOLANGIOPANCREATOGRAPHY  (ERCP);  Surgeon: Ronnette Juniper, MD;  Location: [place] [facility];  Service: Gastroenterology;  Laterality: N/A;  PRONE POSITION  . ERCP N/A 12/29/2016   Procedure: ENDOSCOPIC RETROGRADE CHOLANGIOPANCREATOGRAPHY (ERCP) WITH STENT EXCHANGE;  Surgeon: Rogene Houston, MD;  [facility];  Service: Endoscopy;  Laterality: N/A;  . EYE SURGERY Right    cataract removal  . GASTROINTESTINAL STENT REMOVAL N/A 05/05/2016   Procedure: Biliary stent removal.;  Surgeon: Rogene Houston, MD;  [facility];  Service: Endoscopy;  Laterality: N/A;  . HIP PINNING,CANNULATED Left 09/13/2012   Procedure: ASNIS HIP PINNING LEFT;  Surgeon: Sanjuana Kava, MD;  Location: [facility];  Service: Orthopedics;  Laterality: Left;  . LITHOTRIPSY N/A 03/31/2016   Procedure: MECHANICAL LITHOTRIPSY;  Surgeon: Rogene Houston, MD;  [facility];  Service: Endoscopy;  Laterality: N/A;  . Mouth Sugery     Upper teeth impacted  . REMOVAL OF STONES N/A 03/31/2016   Procedure: PARTIAL RETRIEVAL OF STONE AND STENTING;  Surgeon: Rogene Houston, MD;  [facility];  Service: Endoscopy;  Laterality: N/A;  . REMOVAL OF STONES N/A 05/05/2016   Procedure: REMOVAL OF STONES;  Surgeon: Rogene Houston, MD;  [facility];  Service: Endoscopy;  Laterality: N/A;  . SPHINCTEROTOMY N/A 03/31/2016   Procedure: SPHINCTEROTOMY;  Surgeon: Rogene Houston, MD;  [facility];  Service: Endoscopy;  Laterality: N/A;  . SPYGLASS CHOLANGIOSCOPY N/A 05/05/2016   Procedure: LOVFIEPP CHOLANGIOSCOPY;  Surgeon: Rogene Houston, MD;  [facility];  Service: Endoscopy;  Laterality: N/A;  . TOTAL HIP ARTHROPLASTY Left 10/03/2013   Procedure: LEFT TOTAL HIP ARTHROPLASTY ANTERIOR APPROACH and REMOVAL OF SCREWS LEFT HIP;  Surgeon: Mcarthur Rossetti, MD;  Location: [facility];  Service: Orthopedics;  Laterality: Left;    Family History  Problem Relation Age of Onset  . Heart attack Mother   . Heart  attack Father   . Cancer Brother    Social History:  reports that she has never smoked. She has never used smokeless tobacco. She reports that she does not drink alcohol or use drugs.  Allergies: No Known Allergies  Medications Prior to Admission  Medication Sig Dispense Refill  . clotrimazole-betamethasone (LOTRISONE) cream Apply 1 application topically 2 (two) times daily as needed (for rash.).    Marland Kitchen diltiazem (CARDIZEM CD) 120 MG 24 hr capsule take 1 capsule by mouth once daily 90 capsule 2  . levothyroxine (SYNTHROID, LEVOTHROID) 100 MCG tablet Take 100 mcg by mouth daily before breakfast.     . metoprolol tartrate (LOPRESSOR) 25 MG tablet take 1 tablet twice a day 60 tablet 0  . ursodiol (ACTIGALL) 250 MG tablet Take 1 tablet (250 mg total) by mouth 2 (two) times daily. 60 tablet 5  . acetaminophen (TYLENOL) 325 MG tablet Take 325 mg by mouth every 6 (six) hours as needed (pain/headache).     Marland Kitchen apixaban (ELIQUIS) 2.5 MG TABS tablet Take 1 tablet (2.5 mg total) by mouth 2 (two) times daily. 60 tablet 6    No results found for this or any previous visit (from the past 48 hour(s)). No results found.  ROS  Blood pressure (!) 144/78, pulse 66, temperature 98 F (36.7 C), temperature source Oral, resp. rate (!) 25, height 5\' 4"  (1.626 m), weight 115 lb (52.2 kg), SpO2 94 %. Physical Exam  Constitutional:  Well-developed thin Caucasian female in NAD.  HENT:  Mouth/Throat: Oropharynx is clear and moist.  Eyes: Conjunctivae are normal. No scleral icterus.  Neck: No thyromegaly present.  Cardiovascular:  Irregular rhythm. Normal S1 and S2. No murmur or gallop noted.  Respiratory: Effort normal and breath sounds normal.  GI: Soft. She exhibits no distension and no mass. There is no tenderness.  Musculoskeletal: She exhibits no edema.  Lymphadenopathy:    She has no cervical adenopathy.  Neurological: She is alert.  Skin: Skin is warm and dry.      Assessment/Plan Choledocholithiasis. ERCP with stent and stone removal. She may need mechanical or holmium laserf stone cannot be removed. Spyglass examination would be performed to ensure the duct has been cleared of all stones.   Hildred Laser, MD 03/09/2017, 12:41 PM

## 2017-03-09 NOTE — Op Note (Deleted)
  The note originally documented on this encounter has been moved the the encounter in which it belongs.  

## 2017-03-09 NOTE — Anesthesia Preprocedure Evaluation (Addendum)
Anesthesia Evaluation  Patient identified by MRN, date of birth, ID band Patient awake    Reviewed: Allergy & Precautions, H&P , NPO status , Patient's Chart, lab work & pertinent test results, reviewed documented beta blocker date and time   Airway Mallampati: II  TM Distance: >3 FB Neck ROM: Full    Dental no notable dental hx. (+) Partial Upper, Partial Lower   Pulmonary neg pulmonary ROS,    Pulmonary exam normal breath sounds clear to auscultation       Cardiovascular hypertension, Pt. on medications and Pt. on home beta blockers Normal cardiovascular exam+ dysrhythmias Atrial Fibrillation  Rhythm:Regular Rate:Normal     Neuro/Psych CVA, Residual Symptoms negative psych ROS   GI/Hepatic Neg liver ROS, hiatal hernia,   Endo/Other  Hypothyroidism   Renal/GU negative Renal ROS  negative genitourinary   Musculoskeletal negative musculoskeletal ROS (+) Arthritis ,   Abdominal   Peds negative pediatric ROS (+)  Hematology negative hematology ROS (+)   Anesthesia Other Findings   Reproductive/Obstetrics negative OB ROS                             Anesthesia Physical Anesthesia Plan  ASA: III  Anesthesia Plan: General   Post-op Pain Management:    Induction: Intravenous  PONV Risk Score and Plan:   Airway Management Planned: Oral ETT  Additional Equipment:   Intra-op Plan:   Post-operative Plan: Extubation in OR  Informed Consent: I have reviewed the patients History and Physical, chart, labs and discussed the procedure including the risks, benefits and alternatives for the proposed anesthesia with the patient or authorized representative who has indicated his/her understanding and acceptance.     Plan Discussed with: CRNA  Anesthesia Plan Comments: (Fentanyl - amidate induction planned.)       Anesthesia Quick Evaluation

## 2017-03-09 NOTE — Transfer of Care (Signed)
Immediate Anesthesia Transfer of Care Note  Patient: Regina [name]  Procedure(s) Performed: ENDOSCOPIC RETROGRADE CHOLANGIOPANCREATOGRAPHY (ERCP) (N/A ) SPYGLASS CHOLANGIOSCOPY (N/A ) BILARY STENT REMOVAL, BILIARY STENT PLACEMENT (N/A )  Patient Location: PACU  Anesthesia Type:General  Level of Consciousness: awake  Airway & Oxygen Therapy: Patient Spontanous Breathing and Patient connected to face mask oxygen  Post-op Assessment: Report given to RN  Post vital signs: Reviewed and stable  Last Vitals:  Vitals:   03/09/17 1458 03/09/17 1500  BP:  129/78  Pulse: (!) 58 (!) 106  Resp: 13 (!) 22  Temp:    SpO2: 96% 99%    Last Pain:  Vitals:   03/09/17 1129  TempSrc: Oral         Complications: No apparent anesthesia complications

## 2017-03-12 ENCOUNTER — Encounter (INDEPENDENT_AMBULATORY_CARE_PROVIDER_SITE_OTHER): Payer: Self-pay | Admitting: Internal Medicine

## 2017-03-13 NOTE — Addendum Note (Signed)
Addendum  created 03/13/17 1051 by Ollen Bowl, CRNA   Anesthesia Intra Meds edited

## 2017-03-14 ENCOUNTER — Other Ambulatory Visit (INDEPENDENT_AMBULATORY_CARE_PROVIDER_SITE_OTHER): Payer: Self-pay | Admitting: *Deleted

## 2017-03-14 DIAGNOSIS — K8050 Calculus of bile duct without cholangitis or cholecystitis without obstruction: Secondary | ICD-10-CM

## 2017-03-28 ENCOUNTER — Encounter (HOSPITAL_COMMUNITY): Payer: Self-pay | Admitting: Internal Medicine

## 2017-03-29 ENCOUNTER — Other Ambulatory Visit (INDEPENDENT_AMBULATORY_CARE_PROVIDER_SITE_OTHER): Payer: Self-pay | Admitting: *Deleted

## 2017-03-29 ENCOUNTER — Encounter (INDEPENDENT_AMBULATORY_CARE_PROVIDER_SITE_OTHER): Payer: Self-pay | Admitting: *Deleted

## 2017-03-29 DIAGNOSIS — K8050 Calculus of bile duct without cholangitis or cholecystitis without obstruction: Secondary | ICD-10-CM

## 2017-04-11 DIAGNOSIS — Z23 Encounter for immunization: Secondary | ICD-10-CM | POA: Diagnosis not present

## 2017-05-03 DIAGNOSIS — K8050 Calculus of bile duct without cholangitis or cholecystitis without obstruction: Secondary | ICD-10-CM | POA: Diagnosis not present

## 2017-05-03 LAB — HEPATIC FUNCTION PANEL
AG RATIO: 1.3 (calc) (ref 1.0–2.5)
ALKALINE PHOSPHATASE (APISO): 86 U/L (ref 33–130)
ALT: 9 U/L (ref 6–29)
AST: 14 U/L (ref 10–35)
Albumin: 3.9 g/dL (ref 3.6–5.1)
BILIRUBIN DIRECT: 0.1 mg/dL (ref 0.0–0.2)
BILIRUBIN INDIRECT: 0.4 mg/dL (ref 0.2–1.2)
BILIRUBIN TOTAL: 0.5 mg/dL (ref 0.2–1.2)
Globulin: 3.1 g/dL (calc) (ref 1.9–3.7)
Total Protein: 7 g/dL (ref 6.1–8.1)

## 2017-05-07 ENCOUNTER — Ambulatory Visit (INDEPENDENT_AMBULATORY_CARE_PROVIDER_SITE_OTHER): Payer: Medicare Other | Admitting: Internal Medicine

## 2017-05-07 ENCOUNTER — Encounter (INDEPENDENT_AMBULATORY_CARE_PROVIDER_SITE_OTHER): Payer: Self-pay | Admitting: Internal Medicine

## 2017-05-07 VITALS — BP 138/76 | HR 60 | Temp 97.6°F | Ht 64.0 in | Wt 112.8 lb

## 2017-05-07 DIAGNOSIS — K8051 Calculus of bile duct without cholangitis or cholecystitis with obstruction: Secondary | ICD-10-CM

## 2017-05-07 NOTE — Progress Notes (Signed)
   Subjective:    Patient ID: Regina [name], female    DOB: [birth year], 83 y.o.   MRN: [id number]  HPI Here today for f/u after undergoing an ERCP in [place] for choledocholithiasis. Hs of undergoing multiple ERCPs in the past. In October she was back for stent and stone removal. A fully covered wall stent was placed across stricture. Liver enzymes 05/03/2017 were normal. She tells me she is doing good. She denies any abdominal pain. BMs are moving okay.  Her appetite is good.   Hepatic Function Latest Ref Rng & Units 05/03/2017 02/13/2017 12/31/2016  Total Protein 6.1 - 8.1 g/dL 7.0 7.6 5.8(L)  Albumin 3.5 - 5.0 g/dL - - 2.7(L)  AST 10 - 35 U/L 14 21 54(H)  ALT 6 - 29 U/L 9 14 69(H)  Alk Phosphatase 38 - 126 U/L - - 69  Total Bilirubin 0.2 - 1.2 mg/dL 0.5 0.6 1.9(H)  Bilirubin, Direct 0.0 - 0.2 mg/dL 0.1 0.2 -     Review of Systems     Objective:   Physical Exam .Blood pressure 138/76, pulse 60, temperature 97.6 F (36.4 C), height _0  (1.626 m), weight 112 lb 12.8 oz (51.2 kg). Alert and oriented. Skin warm and dry. Oral mucosa is moist.   . Sclera anicteric, conjunctivae is pink. Thyroid not enlarged. No cervical lymphadenopathy. Lungs clear. Heart regular rate and rhythm.  Abdomen is soft. Bowel sounds are positive. No hepatomegaly. No abdominal masses felt. No tenderness.  No edema to lower extremities.           Assessment & Plan:  Choledocholithiasis. Has undergone multiple ERCPs with removal of stones. Her last ERCP was in October. She is doing well. LFT end of November were normal. Will get a Hepatic in 8 weeks.  OV in 1 year

## 2017-05-07 NOTE — Patient Instructions (Signed)
OV 1 year. 

## 2017-05-23 ENCOUNTER — Other Ambulatory Visit (INDEPENDENT_AMBULATORY_CARE_PROVIDER_SITE_OTHER): Payer: Self-pay | Admitting: *Deleted

## 2017-05-23 DIAGNOSIS — K8309 Other cholangitis: Secondary | ICD-10-CM

## 2017-06-29 ENCOUNTER — Other Ambulatory Visit: Payer: Self-pay

## 2017-06-29 MED ORDER — METOPROLOL TARTRATE 25 MG PO TABS: 25.0000 mg | ORAL_TABLET | Freq: Two times a day (BID) | ORAL | 0 refills | Status: DC

## 2017-06-29 NOTE — Telephone Encounter (Signed)
Refilled to [facility] metoprolol

## 2017-08-02 ENCOUNTER — Ambulatory Visit (INDEPENDENT_AMBULATORY_CARE_PROVIDER_SITE_OTHER): Payer: Medicare Other | Admitting: Cardiovascular Disease

## 2017-08-02 ENCOUNTER — Encounter: Payer: Self-pay | Admitting: Cardiovascular Disease

## 2017-08-02 VITALS — BP 134/60 | HR 50 | Ht 64.0 in | Wt 108.0 lb

## 2017-08-02 DIAGNOSIS — I350 Nonrheumatic aortic (valve) stenosis: Secondary | ICD-10-CM

## 2017-08-02 DIAGNOSIS — I10 Essential (primary) hypertension: Secondary | ICD-10-CM | POA: Diagnosis not present

## 2017-08-02 DIAGNOSIS — I4891 Unspecified atrial fibrillation: Secondary | ICD-10-CM

## 2017-08-02 NOTE — Patient Instructions (Addendum)
Your physician wants you to follow-up in:1 year  with Dr Virgina Jock will receive a reminder letter in the mail two months in advance. If you don't receive a letter, please call our office to schedule the follow-up appointment.     DECREASE Lopressor to 25 mg once a day for 3 days, and then STOP     Your physician has requested that you have an echocardiogram. Echocardiography is a painless test that uses sound waves to create images of your heart. It provides your doctor with information about the size and shape of your heart and how well your heart's chambers and valves are working. This procedure takes approximately one hour. There are no restrictions for this procedure.      No lab work ordered today     If you need a refill on your cardiac medications before your next appointment, please call your pharmacy.         Thank you for choosing [facility] !

## 2017-08-02 NOTE — Progress Notes (Signed)
SUBJECTIVE: The patient presents for past due follow-up.  I last evaluated her in June 2017.  She has a history of atrial fibrillation, hypertension, hypothyroidism and CVA.  An echocardiogram performed in April 2014 showed normal left ventricular systolic function with an ejection fraction of 60% and very mild aortic stenosis, along with mitral and tricuspid valvular regurgitation.   She underwent a normal nuclear stress test on 08/26/2013.  She underwent ERCP with stent removal and placement of a fully covered Wallstent on 03/09/17 by Dr. Laural Golden.  The patient denies any symptoms of chest pain, palpitations, shortness of breath, lightheadedness, dizziness, leg swelling, orthopnea, PND, and syncope.  She walks with a walker and is able to carry out her activities of daily living without difficulty.  I reviewed the most recent ECG I find in the system dated 12/29/16 which shows rapid atrial fibrillation, 101 bpm.  Review of Systems: As per "subjective", otherwise negative.  No Known Allergies  Current Outpatient Medications  Medication Sig Dispense Refill  . acetaminophen (TYLENOL) 325 MG tablet Take 325 mg by mouth every 6 (six) hours as needed (pain/headache).     Marland Kitchen apixaban (ELIQUIS) 2.5 MG TABS tablet Take 1 tablet (2.5 mg total) by mouth 2 (two) times daily. 60 tablet 6  . clotrimazole-betamethasone (LOTRISONE) cream Apply 1 application topically 2 (two) times daily as needed (for rash.).    Marland Kitchen diltiazem (CARDIZEM CD) 120 MG 24 hr capsule take 1 capsule by mouth once daily 90 capsule 2  . levothyroxine (SYNTHROID, LEVOTHROID) 100 MCG tablet Take 100 mcg by mouth daily before breakfast.     . metoprolol tartrate (LOPRESSOR) 25 MG tablet Take 1 tablet (25 mg total) by mouth 2 (two) times daily. 60 tablet 0  . ursodiol (ACTIGALL) 250 MG tablet Take 1 tablet (250 mg total) by mouth 2 (two) times daily. 60 tablet 5   No current facility-administered medications for this visit.      Past Medical History:  Diagnosis Date  . Anemia   . Arthritis   . Avascular necrosis of bone of left hip ([place])   . Dysrhythmia 4/14   atrial fib post op  . H/O hiatal hernia   . History of blood transfusion   . Hypertension   . Hypothyroidism   . Stroke [facility]) 2003   No deficits    Past Surgical History:  Procedure Laterality Date  . BALLOON DILATION N/A 05/05/2016   Procedure: BALLOON DILATION OF SPHINCTEROTOMY;  Surgeon: Rogene [place], MD;  [facility];  Service: Endoscopy;  Laterality: N/A;  . ERCP N/A 03/31/2016   Procedure: ENDOSCOPIC RETROGRADE CHOLANGIOPANCREATOGRAPHY (ERCP);  Surgeon: Rogene [place], MD;  [facility];  Service: Endoscopy;  Laterality: N/A;  . ERCP N/A 05/05/2016   Procedure: ENDOSCOPIC RETROGRADE CHOLANGIOPANCREATOGRAPHY (ERCP);  Surgeon: Rogene [place], MD;  [facility];  Service: Endoscopy;  Laterality: N/A;  do NOT move per [facility]  . ERCP N/A 11/11/2016   Procedure: ENDOSCOPIC RETROGRADE CHOLANGIOPANCREATOGRAPHY (ERCP);  Surgeon: Ronnette Juniper, MD;  Location: [place];  Service: Gastroenterology;  Laterality: N/A;  PRONE POSITION  . ERCP N/A 12/29/2016   Procedure: ENDOSCOPIC RETROGRADE CHOLANGIOPANCREATOGRAPHY (ERCP) WITH STENT EXCHANGE;  Surgeon: Rogene [place], MD;  [facility];  Service: Endoscopy;  Laterality: N/A;  . ERCP N/A 03/09/2017   Procedure: ENDOSCOPIC RETROGRADE CHOLANGIOPANCREATOGRAPHY (ERCP);  Surgeon: Rogene [place], MD;  [facility];  Service: Endoscopy;  Laterality: N/A;  .  EYE SURGERY Right    cataract removal  . GASTROINTESTINAL STENT REMOVAL N/A 05/05/2016   Procedure: Biliary stent removal.;  Surgeon: Rogene [place], MD;  [facility];  Service: Endoscopy;  Laterality: N/A;  . GASTROINTESTINAL STENT REMOVAL N/A 03/09/2017   Procedure: BILARY STENT REMOVAL, BILIARY STENT PLACEMENT;  Surgeon: Rogene [place], MD;  [facility];  Service:  Endoscopy;  Laterality: N/A;  . HIP PINNING,CANNULATED Left 09/13/2012   Procedure: ASNIS HIP PINNING LEFT;  Surgeon: Sanjuana Kava, MD;  [facility];  Service: Orthopedics;  Laterality: Left;  . LITHOTRIPSY N/A 03/31/2016   Procedure: MECHANICAL LITHOTRIPSY;  Surgeon: Rogene [place], MD;  [facility];  Service: Endoscopy;  Laterality: N/A;  . Mouth Sugery     Upper teeth impacted  . REMOVAL OF STONES N/A 03/31/2016   Procedure: PARTIAL RETRIEVAL OF STONE AND STENTING;  Surgeon: Rogene [place], MD;  [facility];  Service: Endoscopy;  Laterality: N/A;  . REMOVAL OF STONES N/A 05/05/2016   Procedure: REMOVAL OF STONES;  Surgeon: Rogene [place], MD;  [facility];  Service: Endoscopy;  Laterality: N/A;  . SPHINCTEROTOMY N/A 03/31/2016   Procedure: SPHINCTEROTOMY;  Surgeon: Rogene [place], MD;  [facility];  Service: Endoscopy;  Laterality: N/A;  . SPYGLASS CHOLANGIOSCOPY N/A 05/05/2016   Procedure: FGHWEXHB CHOLANGIOSCOPY;  Surgeon: Rogene [place], MD;  [facility];  Service: Endoscopy;  Laterality: N/A;  . SPYGLASS CHOLANGIOSCOPY N/A 03/09/2017   Procedure: ZJIRCVEL CHOLANGIOSCOPY;  Surgeon: Rogene [place], MD;  [facility];  Service: Endoscopy;  Laterality: N/A;  . TOTAL HIP ARTHROPLASTY Left 10/03/2013   Procedure: LEFT TOTAL HIP ARTHROPLASTY ANTERIOR APPROACH and REMOVAL OF SCREWS LEFT HIP;  Surgeon: Mcarthur Rossetti, MD;  [facility];  Service: Orthopedics;  Laterality: Left;    Social History   Socioeconomic History  . Marital status: Widowed    Spouse name: Not on file  . Number of children: Not on file  . Years of education: Not on file  . Highest education level: Not on file  Social Needs  . Financial resource strain: Not on file  . Food insecurity - worry: Not on file  . Food insecurity - inability: Not on file  . Transportation needs - medical: Not on file  . Transportation needs -  non-medical: Not on file  Occupational History  . Not on file  Tobacco Use  . Smoking status: Never Smoker  . Smokeless tobacco: Never Used  . Tobacco comment: Smoked a few times in college (socially)  Substance and Sexual Activity  . Alcohol use: No    Alcohol/week: 0.0 oz    Comment: None since Christmas 2013  . Drug use: No  . Sexual activity: No    Birth control/protection: None  Other Topics Concern  . Not on file  Social History Narrative  . Not on file     Vitals:   08/02/17 1321  BP: 134/60  Pulse: (!) 50  SpO2: 96%  Weight: 108 lb (49 kg)  Height: 5\' 4"  (1.626 m)    Wt Readings from Last 3 Encounters:  08/02/17 108 lb (49 kg)  05/07/17 112 lb 12.8 oz (51.2 kg)  03/09/17 115 lb (52.2 kg)     PHYSICAL EXAM General: NAD HEENT: Normal. Neck: No JVD, no thyromegaly. Lungs: Clear to auscultation bilaterally with normal respiratory effort. CV: Bradycardic, regular rhythm, normal S1/S2, no S3/S4, 2/6  systolic murmur loudest over right upper sternal border. No pretibial or periankle edema.   Abdomen: Soft, nontender, no distention.  Neurologic: Alert and oriented.  Psych: Normal affect. Skin: Normal. Musculoskeletal: No gross deformities.    ECG: Most recent ECG reviewed.   Labs: Lab Results  Component Value Date/Time   K 4.6 03/05/2017 02:22 PM   BUN 21 (H) 03/05/2017 02:22 PM   CREATININE 1.00 03/05/2017 02:22 PM   ALT 9 05/03/2017 02:09 PM   TSH 2.308 11/10/2016 08:37 PM   TSH 18.557 (H) 09/16/2012 06:36 PM   HGB 14.0 03/05/2017 02:22 PM     Lipids: No results found for: LDLCALC, LDLDIRECT, CHOL, TRIG, HDL     ASSESSMENT AND PLAN: 1. Paroxysmal atrial fibrillation/flutter: Symptomatically stable. Heart rate is controlled but bradycardic. I will reduce metoprolol to 25 mg every morning and then have her stop it altogether.  No bleeding problems with Eliquis. Continue long-acting diltiazem 120 mg daily.  2. Essential HTN: Controlled on  present therapy.  I will monitor given cessation of metoprolol as noted above.  3. Mild aortic stenosis: Stable.  I will obtain an echocardiogram to assess for interval progression.    Disposition: Follow up 1 year   Kate Sable, M.D., F.A.C.C.

## 2017-08-03 ENCOUNTER — Other Ambulatory Visit: Payer: Self-pay | Admitting: Cardiovascular Disease

## 2017-08-09 ENCOUNTER — Ambulatory Visit (HOSPITAL_COMMUNITY)
Admission: RE | Admit: 2017-08-09 | Discharge: 2017-08-09 | Disposition: A | Discharge status: Home / Self Care | Payer: Medicare Other | Source: Ambulatory Visit | Attending: Cardiovascular Disease | Admitting: Cardiovascular Disease

## 2017-08-09 ENCOUNTER — Telehealth: Payer: Self-pay

## 2017-08-09 DIAGNOSIS — I350 Nonrheumatic aortic (valve) stenosis: Secondary | ICD-10-CM

## 2017-08-09 DIAGNOSIS — I083 Combined rheumatic disorders of mitral, aortic and tricuspid valves: Secondary | ICD-10-CM | POA: Diagnosis not present

## 2017-08-09 DIAGNOSIS — I119 Hypertensive heart disease without heart failure: Secondary | ICD-10-CM | POA: Insufficient documentation

## 2017-08-09 NOTE — Progress Notes (Signed)
*  PRELIMINARY RESULTS* Echocardiogram 2D Echocardiogram has been performed.  Regina [name] 08/09/2017, 3:01 PM

## 2017-08-09 NOTE — Telephone Encounter (Signed)
Bernie (echo tech) called to inform Dr. Bronson Ing that the pt's blood pressure was elevated today.(178/98) Pt's son stated it's bee like that since her medication was changed at last visit. Please advise.

## 2017-08-10 NOTE — Telephone Encounter (Signed)
Start Edarbi 40 mg daily.

## 2017-08-14 ENCOUNTER — Other Ambulatory Visit: Payer: Self-pay | Admitting: Cardiovascular Disease

## 2017-08-21 DIAGNOSIS — I10 Essential (primary) hypertension: Secondary | ICD-10-CM | POA: Diagnosis not present

## 2017-08-21 DIAGNOSIS — E039 Hypothyroidism, unspecified: Secondary | ICD-10-CM | POA: Diagnosis not present

## 2017-08-21 DIAGNOSIS — K8000 Calculus of gallbladder with acute cholecystitis without obstruction: Secondary | ICD-10-CM | POA: Diagnosis not present

## 2017-08-21 DIAGNOSIS — I482 Chronic atrial fibrillation: Secondary | ICD-10-CM | POA: Diagnosis not present

## 2017-10-10 DIAGNOSIS — R5383 Other fatigue: Secondary | ICD-10-CM | POA: Diagnosis not present

## 2017-10-10 DIAGNOSIS — R197 Diarrhea, unspecified: Secondary | ICD-10-CM | POA: Diagnosis not present

## 2017-10-10 DIAGNOSIS — R634 Abnormal weight loss: Secondary | ICD-10-CM | POA: Diagnosis not present

## 2017-10-11 DIAGNOSIS — R197 Diarrhea, unspecified: Secondary | ICD-10-CM | POA: Diagnosis not present

## 2017-10-11 DIAGNOSIS — R5383 Other fatigue: Secondary | ICD-10-CM | POA: Diagnosis not present

## 2017-10-11 DIAGNOSIS — R634 Abnormal weight loss: Secondary | ICD-10-CM | POA: Diagnosis not present

## 2017-11-19 ENCOUNTER — Other Ambulatory Visit: Payer: Self-pay | Admitting: Cardiovascular Disease

## 2017-11-20 DIAGNOSIS — I482 Chronic atrial fibrillation: Secondary | ICD-10-CM | POA: Diagnosis not present

## 2017-11-20 DIAGNOSIS — I10 Essential (primary) hypertension: Secondary | ICD-10-CM | POA: Diagnosis not present

## 2017-11-20 DIAGNOSIS — K8000 Calculus of gallbladder with acute cholecystitis without obstruction: Secondary | ICD-10-CM | POA: Diagnosis not present

## 2017-12-03 DIAGNOSIS — I482 Chronic atrial fibrillation: Secondary | ICD-10-CM | POA: Diagnosis not present

## 2017-12-03 DIAGNOSIS — E119 Type 2 diabetes mellitus without complications: Secondary | ICD-10-CM | POA: Diagnosis not present

## 2017-12-03 DIAGNOSIS — K8080 Other cholelithiasis without obstruction: Secondary | ICD-10-CM | POA: Diagnosis not present

## 2017-12-03 DIAGNOSIS — I10 Essential (primary) hypertension: Secondary | ICD-10-CM | POA: Diagnosis not present

## 2017-12-11 ENCOUNTER — Ambulatory Visit (INDEPENDENT_AMBULATORY_CARE_PROVIDER_SITE_OTHER): Payer: Medicare Other | Admitting: Internal Medicine

## 2017-12-11 ENCOUNTER — Encounter (INDEPENDENT_AMBULATORY_CARE_PROVIDER_SITE_OTHER): Payer: Self-pay | Admitting: Internal Medicine

## 2017-12-11 VITALS — BP 160/90 | HR 64 | Temp 97.4°F | Ht 64.0 in | Wt 99.2 lb

## 2017-12-11 DIAGNOSIS — R634 Abnormal weight loss: Secondary | ICD-10-CM

## 2017-12-11 DIAGNOSIS — R197 Diarrhea, unspecified: Secondary | ICD-10-CM | POA: Diagnosis not present

## 2017-12-11 NOTE — Patient Instructions (Signed)
GI pathogen   

## 2017-12-11 NOTE — Progress Notes (Addendum)
Subjective:    Patient ID: Regina [name], female    DOB: 1922/08/06, 84 y.o.   MRN: [id number] [facility] with a walker.  HPI Referred by Dr. Luan Pulling for wt loss/diarrhea. She apparently tested positive for H. Pylori but has not been treated. URSO stopped around February.  She has lost weight (December 112). Today her weight is 99.2.  She says her appetite is pretty good lately. She is eating 3 meals a day.  Family states she is eating pretty well. She eats a small lunch like an Ensure.  She says she is having diarrhea at times.  She has loose stools in pieces.  The diarrhea started after starting the URSO which has now been stopped. Now have 3-5 stools a day. Has not lost any weight in the past 4 weeks.  No recent antibiotics.    Hx of CBD stone and underwent an ERCP in October of last year. Has undergone multiple ERCP in the past.   10/11/2017 Stool culture negative. Ova and Para negative.  Hx of CVA,  Atrial fib.   Last coloscopy 2008 with anemia and Hem positive stool. Dr. Gala Romney   IMPRESSION:  Colonoscopy findings:  Internal hemorrhoids diminutive,  rectosigmoid polyp cold biopsy/removed.  The remainder rectal mucosa  appeared normal.  Densely populated pan colonic diverticula.  Remainder  colonic mucosa appeared normal.  Review of Systems Past Medical History:  Diagnosis Date  . Anemia   . Arthritis   . Avascular necrosis of bone of left hip ([place])   . Dysrhythmia 4/14   atrial fib post op  . H/O hiatal hernia   . History of blood transfusion   . Hypertension   . Hypothyroidism   . Stroke [facility]) 2003   No deficits    Past Surgical History:  Procedure Laterality Date  . BALLOON DILATION N/A 05/05/2016   Procedure: BALLOON DILATION OF SPHINCTEROTOMY;  Surgeon: [place], MD;  [facility];  Service: Endoscopy;  Laterality: N/A;  . ERCP N/A 03/31/2016   Procedure: ENDOSCOPIC RETROGRADE CHOLANGIOPANCREATOGRAPHY (ERCP);  Surgeon: [place], MD;   [facility];  Service: Endoscopy;  Laterality: N/A;  . ERCP N/A 05/05/2016   Procedure: ENDOSCOPIC RETROGRADE CHOLANGIOPANCREATOGRAPHY (ERCP);  Surgeon: [place], MD;  [facility];  Service: Endoscopy;  Laterality: N/A;  do NOT move per [facility]  . ERCP N/A 11/11/2016   Procedure: ENDOSCOPIC RETROGRADE CHOLANGIOPANCREATOGRAPHY (ERCP);  Surgeon: Ronnette Juniper, MD;  Location: [place];  Service: Gastroenterology;  Laterality: N/A;  PRONE POSITION  . ERCP N/A 12/29/2016   Procedure: ENDOSCOPIC RETROGRADE CHOLANGIOPANCREATOGRAPHY (ERCP) WITH STENT EXCHANGE;  Surgeon: [place], MD;  [facility];  Service: Endoscopy;  Laterality: N/A;  . ERCP N/A 03/09/2017   Procedure: ENDOSCOPIC RETROGRADE CHOLANGIOPANCREATOGRAPHY (ERCP);  Surgeon: R[place], MD;  [facility];  Service: Endoscopy;  Laterality: N/A;  . EYE SURGERY Right    cataract removal  . GASTROINTESTINAL STENT REMOVAL N/A 05/05/2016   Procedure: Biliary stent removal.;  Surgeon: R[place], MD;  [facility];  Service: Endoscopy;  Laterality: N/A;  . GASTROINTESTINAL STENT REMOVAL N/A 03/09/2017   Procedure: BILARY STENT REMOVAL, BILIARY STENT PLACEMENT;  Surgeon: R[place], MD;  [facility];  Service: Endoscopy;  Laterality: N/A;  . HIP PINNING,CANNULATED Left 09/13/2012   Procedure: ASNIS HIP PINNING LEFT;  Surgeon: Sanjuana Kava, MD;  [facility];  Service: Orthopedics;  Laterality: Left;  .  LITHOTRIPSY N/A 03/31/2016   Procedure: MECHANICAL LITHOTRIPSY;  Surgeon: R[place], MD;  [facility];  Service: Endoscopy;  Laterality: N/A;  . Mouth Sugery     Upper teeth impacted  . REMOVAL OF STONES N/A 03/31/2016   Procedure: PARTIAL RETRIEVAL OF STONE AND STENTING;  Surgeon: R[place], MD;  [facility];  Service: Endoscopy;  Laterality: N/A;  . REMOVAL OF STONES N/A 05/05/2016   Procedure: REMOVAL OF STONES;   Surgeon: R[place], MD;  [facility];  Service: Endoscopy;  Laterality: N/A;  . SPHINCTEROTOMY N/A 03/31/2016   Procedure: SPHINCTEROTOMY;  Surgeon: R[place], MD;  [facility];  Service: Endoscopy;  Laterality: N/A;  . SPYGLASS CHOLANGIOSCOPY N/A 05/05/2016   Procedure: [id number] CHOLANGIOSCOPY;  Surgeon: R[place], MD;  [facility];  Service: Endoscopy;  Laterality: N/A;  . SPYGLASS CHOLANGIOSCOPY N/A 03/09/2017   Procedure: [id number] CHOLANGIOSCOPY;  Surgeon: R[place], MD;  [facility];  Service: Endoscopy;  Laterality: N/A;  . TOTAL HIP ARTHROPLASTY Left 10/03/2013   Procedure: LEFT TOTAL HIP ARTHROPLASTY ANTERIOR APPROACH and REMOVAL OF SCREWS LEFT HIP;  Surgeon: Mcarthur Rossetti, MD;  [facility];  Service: Orthopedics;  Laterality: Left;    No Known Allergies  Current Outpatient Medications on File Prior to Visit  Medication Sig Dispense Refill  . acetaminophen (TYLENOL) 325 MG tablet Take 325 mg by mouth every 6 (six) hours as needed (pain/headache).     Marland Kitchen apixaban (ELIQUIS) 2.5 MG TABS tablet Take 1 tablet (2.5 mg total) by mouth 2 (two) times daily. 60 tablet 6  . clotrimazole-betamethasone (LOTRISONE) cream Apply 1 application topically 2 (two) times daily as needed (for rash.).    Marland Kitchen diltiazem (CARDIZEM CD) 120 MG 24 hr capsule TAKE 1 CAPSULE BY MOUTH ONCE DAILY 90 capsule 3  . levothyroxine (SYNTHROID, LEVOTHROID) 100 MCG tablet Take 100 mcg by mouth daily before breakfast.      No current facility-administered medications on file prior to visit.         Objective:   Physical Exam Blood pressure (!) 160/90, pulse 64, temperature (!) 97.4 F (36.3 C), height 5\' 4"  (1.626 m), weight 99 lb 3.2 oz (45 kg). Alert and oriented. Skin warm and dry. Oral mucosa is moist.   . Sclera anicteric, conjunctivae is pink. Thyroid not enlarged. No cervical lymphadenopathy. Lungs clear. Heart regular rate and rhythm.   Abdomen is soft. Bowel sounds are positive. No hepatomegaly. No abdominal masses felt. No tenderness.  2+ edema to lower extremities.          Assessment & Plan:  Weight loss. Wt loss of about 12 pounds since December. No weight loss in last 4 weeks. He appetite is good. She is eating 3 meals a day. Will get a GI pathogen. Labs from Dr. Kathaleen Grinder office.  Am going to get a GI pathogen.

## 2017-12-13 DIAGNOSIS — R197 Diarrhea, unspecified: Secondary | ICD-10-CM | POA: Diagnosis not present

## 2017-12-17 LAB — GASTROINTESTINAL PATHOGEN PANEL PCR
C. difficile Tox A/B, PCR: NOT DETECTED
Campylobacter, PCR: NOT DETECTED
Cryptosporidium, PCR: NOT DETECTED
E COLI (ETEC) LT/ST, PCR: NOT DETECTED
E COLI (STEC) STX1/STX2, PCR: NOT DETECTED
E COLI 0157, PCR: NOT DETECTED
Giardia lamblia, PCR: NOT DETECTED
NOROVIRUS, PCR: NOT DETECTED
Rotavirus A, PCR: NOT DETECTED
SHIGELLA, PCR: NOT DETECTED
Salmonella, PCR: NOT DETECTED

## 2017-12-24 ENCOUNTER — Telehealth (INDEPENDENT_AMBULATORY_CARE_PROVIDER_SITE_OTHER): Payer: Self-pay | Admitting: *Deleted

## 2017-12-24 NOTE — Telephone Encounter (Signed)
Patient's daughter, Mary Sella called [phone]) -- wants to know if Ms Muhs needs any follow up from her OV about losing weight -- please call Jan

## 2017-12-25 ENCOUNTER — Encounter (INDEPENDENT_AMBULATORY_CARE_PROVIDER_SITE_OTHER): Payer: Self-pay | Admitting: Internal Medicine

## 2017-12-25 NOTE — Telephone Encounter (Signed)
Needs OV in 8 weeks

## 2017-12-25 NOTE — Telephone Encounter (Signed)
OV sch'd 02/05/18 at 215, left detailed message for Jan

## 2018-02-05 ENCOUNTER — Ambulatory Visit (INDEPENDENT_AMBULATORY_CARE_PROVIDER_SITE_OTHER): Payer: Medicare Other | Admitting: Internal Medicine

## 2018-02-05 ENCOUNTER — Encounter (INDEPENDENT_AMBULATORY_CARE_PROVIDER_SITE_OTHER): Payer: Self-pay | Admitting: *Deleted

## 2018-02-05 ENCOUNTER — Encounter (INDEPENDENT_AMBULATORY_CARE_PROVIDER_SITE_OTHER): Payer: Self-pay | Admitting: Internal Medicine

## 2018-02-05 DIAGNOSIS — R634 Abnormal weight loss: Secondary | ICD-10-CM

## 2018-02-05 NOTE — Patient Instructions (Addendum)
Call with your weight weekly Ct abdomen/pelvis with CM.

## 2018-02-05 NOTE — Progress Notes (Signed)
Subjective:    Patient ID: Regina [name], female    DOB: 1923/03/16, 84 y.o.   MRN: [id number]  HPIHere today for f/u. Last seen in July of this year. Wt loss/diarrhea.  Last weight in July was 99. Today her weight is 92. She has lost about 7 pounds.  At [place] had not lost any weight in 4 weeks. Her daughter in law states she is getting plenty of calories during the day.  She has a BM at least 2 a day. No diarrhea. Stools are formed.  No melena or BRRB She is eating 3 meals a day. Drinks Ensure daily.    Hx of CVA,  Atrial fib. Maintained on Eliquis.   Hx of CBD stone and underwent an ERCP in October of last year. Has undergone multiple ERCP in the past.  10/11/2017 Stool culture negative. Ova and Para negative.    Last coloscopy 2008 with anemia and Hem positive stool. Dr. Gala Romney  IMPRESSION: Colonoscopy findings: Internal hemorrhoids diminutive, rectosigmoid polyp cold biopsy/removed. The remainder rectal mucosa appeared normal. Densely populated pan colonic diverticula. Remainder colonic mucosa appeared normal.   Review of Systems Past Medical History:  Diagnosis Date  . Anemia   . Arthritis   . Avascular necrosis of bone of left hip ([place])   . Dysrhythmia 4/14   atrial fib post op  . H/O hiatal hernia   . History of blood transfusion   . Hypertension   . Hypothyroidism   . Stroke [facility]) 2003   No deficits    Past Surgical History:  Procedure Laterality Date  . BALLOON DILATION N/A 05/05/2016   Procedure: BALLOON DILATION OF SPHINCTEROTOMY;  Surgeon: R[place], MD;  [facility];  Service: Endoscopy;  Laterality: N/A;  . ERCP N/A 03/31/2016   Procedure: ENDOSCOPIC RETROGRADE CHOLANGIOPANCREATOGRAPHY (ERCP);  Surgeon: R[place], MD;  [facility];  Service: Endoscopy;  Laterality: N/A;  . ERCP N/A 05/05/2016   Procedure: ENDOSCOPIC RETROGRADE CHOLANGIOPANCREATOGRAPHY (ERCP);  Surgeon: R[place], MD;  [facility];  Service: Endoscopy;  Laterality: N/A;  do NOT move per [employer] [facility]  . ERCP N/A 11/11/2016   Procedure: ENDOSCOPIC RETROGRADE CHOLANGIOPANCREATOGRAPHY (ERCP);  Surgeon: Ronnette Juniper, MD;  Location: [place];  Service: Gastroenterology;  Laterality: N/A;  PRONE POSITION  . ERCP N/A 12/29/2016   Procedure: ENDOSCOPIC RETROGRADE CHOLANGIOPANCREATOGRAPHY (ERCP) WITH STENT EXCHANGE;  Surgeon: R[place], MD;  [facility];  Service: Endoscopy;  Laterality: N/A;  . ERCP N/A 03/09/2017   Procedure: ENDOSCOPIC RETROGRADE CHOLANGIOPANCREATOGRAPHY (ERCP);  Surgeon: R[place], MD;  [facility];  Service: Endoscopy;  Laterality: N/A;  . EYE SURGERY Right    cataract removal  . GASTROINTESTINAL STENT REMOVAL N/A 05/05/2016   Procedure: Biliary stent removal.;  Surgeon: R[place], MD;  [facility];  Service: Endoscopy;  Laterality: N/A;  . GASTROINTESTINAL STENT REMOVAL N/A 03/09/2017   Procedure: BILARY STENT REMOVAL, BILIARY STENT PLACEMENT;  Surgeon: R[place], MD;  [facility];  Service: Endoscopy;  Laterality: N/A;  . HIP PINNING,CANNULATED Left 09/13/2012   Procedure: ASNIS HIP PINNING LEFT;  Surgeon: Sanjuana Kava, MD;  [facility];  Service: Orthopedics;  Laterality: Left;  . LITHOTRIPSY N/A 03/31/2016   Procedure: MECHANICAL LITHOTRIPSY;  Surgeon: R[place], MD;  [facility];  Service: Endoscopy;  Laterality: N/A;  . Mouth Sugery     Upper teeth impacted  .  REMOVAL OF STONES N/A 03/31/2016   Procedure: PARTIAL RETRIEVAL OF STONE AND STENTING;  Surgeon: R[place], MD;  [facility];  Service: Endoscopy;  Laterality: N/A;  . REMOVAL OF STONES N/A 05/05/2016   Procedure: REMOVAL OF STONES;  Surgeon: R[place], MD;  [facility];  Service: Endoscopy;  Laterality: N/A;  . SPHINCTEROTOMY N/A 03/31/2016   Procedure: SPHINCTEROTOMY;  Surgeon: R[place], MD;  [facility];  Service: Endoscopy;  Laterality: N/A;  . SPYGLASS CHOLANGIOSCOPY N/A 05/05/2016   Procedure: [id number] CHOLANGIOSCOPY;  Surgeon: [place], MD;  [facility];  Service: Endoscopy;  Laterality: N/A;  . SPYGLASS CHOLANGIOSCOPY N/A 03/09/2017   Procedure: [id number] CHOLANGIOSCOPY;  Surgeon: R[place], MD;  [facility];  Service: Endoscopy;  Laterality: N/A;  . TOTAL HIP ARTHROPLASTY Left 10/03/2013   Procedure: LEFT TOTAL HIP ARTHROPLASTY ANTERIOR APPROACH and REMOVAL OF SCREWS LEFT HIP;  Surgeon: Mcarthur Rossetti, MD;  [facility];  Service: Orthopedics;  Laterality: Left;    No Known Allergies  Current Outpatient Medications on File Prior to Visit  Medication Sig Dispense Refill  . acetaminophen (TYLENOL) 325 MG tablet Take 325 mg by mouth every 6 (six) hours as needed (pain/headache).     Marland Kitchen apixaban (ELIQUIS) 2.5 MG TABS tablet Take 1 tablet (2.5 mg total) by mouth 2 (two) times daily. 60 tablet 6  . clotrimazole-betamethasone (LOTRISONE) cream Apply 1 application topically 2 (two) times daily as needed (for rash.).    Marland Kitchen diltiazem (CARDIZEM CD) 120 MG 24 hr capsule TAKE 1 CAPSULE BY MOUTH ONCE DAILY 90 capsule 3  . levothyroxine (SYNTHROID, LEVOTHROID) 100 MCG tablet Take 100 mcg by mouth daily before breakfast.      No current facility-administered medications on file prior to visit.         Objective:   Physical Exam Blood pressure 140/86, pulse (!) 56, temperature 98 F (36.7 C), height 5\' 4"  (1.626 m), weight 92 lb (41.7 kg). Alert and oriented. Skin warm and dry. Oral mucosa is moist.   . Sclera anicteric, conjunctivae is pink. Thyroid not enlarged. No cervical lymphadenopathy. Lungs clear. Heart regular rate and rhythm.  Abdomen is soft. Bowel sounds are positive. No hepatomegaly. No abdominal masses felt. No tenderness.  No edema to lower extremities.           Assessment & Plan:  Weight loss. Her appetite is good.  Daughter in law will call with a weight every week.

## 2018-02-06 LAB — COMPREHENSIVE METABOLIC PANEL
AG Ratio: 1.1 (calc) (ref 1.0–2.5)
ALT: 15 U/L (ref 6–29)
AST: 19 U/L (ref 10–35)
Albumin: 3.5 g/dL — ABNORMAL LOW (ref 3.6–5.1)
Alkaline phosphatase (APISO): 121 U/L (ref 33–130)
BUN/Creatinine Ratio: 13 (calc) (ref 6–22)
BUN: 14 mg/dL (ref 7–25)
CO2: 30 mmol/L (ref 20–32)
CREATININE: 1.12 mg/dL — AB (ref 0.60–0.88)
Calcium: 8.6 mg/dL (ref 8.6–10.4)
Chloride: 101 mmol/L (ref 98–110)
Globulin: 3.1 g/dL (calc) (ref 1.9–3.7)
Glucose, Bld: 130 mg/dL (ref 65–139)
POTASSIUM: 4.3 mmol/L (ref 3.5–5.3)
SODIUM: 137 mmol/L (ref 135–146)
TOTAL PROTEIN: 6.6 g/dL (ref 6.1–8.1)
Total Bilirubin: 0.5 mg/dL (ref 0.2–1.2)

## 2018-02-07 ENCOUNTER — Telehealth (INDEPENDENT_AMBULATORY_CARE_PROVIDER_SITE_OTHER): Payer: Self-pay | Admitting: Internal Medicine

## 2018-02-07 DIAGNOSIS — K219 Gastro-esophageal reflux disease without esophagitis: Secondary | ICD-10-CM

## 2018-02-07 NOTE — Telephone Encounter (Signed)
H. pyloria

## 2018-02-07 NOTE — Telephone Encounter (Signed)
Please call daughter in [place] at [phone]

## 2018-02-07 NOTE — Telephone Encounter (Signed)
I have talked with Daughter. Am repeating H. pylori

## 2018-02-14 ENCOUNTER — Encounter (INDEPENDENT_AMBULATORY_CARE_PROVIDER_SITE_OTHER): Payer: Self-pay

## 2018-02-19 ENCOUNTER — Telehealth (INDEPENDENT_AMBULATORY_CARE_PROVIDER_SITE_OTHER): Payer: Self-pay | Admitting: Internal Medicine

## 2018-02-19 NOTE — Telephone Encounter (Signed)
Weight 02/17/2018:   91.4lb

## 2018-02-21 ENCOUNTER — Ambulatory Visit (HOSPITAL_COMMUNITY)
Admission: RE | Admit: 2018-02-21 | Discharge: 2018-02-21 | Disposition: A | Discharge status: Home / Self Care | Payer: Medicare Other | Source: Ambulatory Visit | Attending: Internal Medicine | Admitting: Internal Medicine

## 2018-02-21 DIAGNOSIS — R634 Abnormal weight loss: Secondary | ICD-10-CM | POA: Diagnosis not present

## 2018-02-21 DIAGNOSIS — K449 Diaphragmatic hernia without obstruction or gangrene: Secondary | ICD-10-CM | POA: Diagnosis not present

## 2018-02-21 DIAGNOSIS — I771 Stricture of artery: Secondary | ICD-10-CM | POA: Insufficient documentation

## 2018-02-21 MED ORDER — IOPAMIDOL (ISOVUE-300) INJECTION 61%
75.0000 mL | Freq: Once | INTRAVENOUS | Status: AC | PRN
  Administered 2018-02-21: 60 mL via INTRAVENOUS

## 2018-02-27 ENCOUNTER — Telehealth (INDEPENDENT_AMBULATORY_CARE_PROVIDER_SITE_OTHER): Payer: Self-pay | Admitting: Internal Medicine

## 2018-02-27 NOTE — Telephone Encounter (Signed)
I have spoken with daughter

## 2018-02-27 NOTE — Telephone Encounter (Signed)
Patients daughter in law Benjamine Mola wants you to call her at [phone]

## 2018-03-07 ENCOUNTER — Ambulatory Visit (INDEPENDENT_AMBULATORY_CARE_PROVIDER_SITE_OTHER): Payer: Medicare Other | Admitting: Internal Medicine

## 2018-03-07 ENCOUNTER — Encounter (INDEPENDENT_AMBULATORY_CARE_PROVIDER_SITE_OTHER): Payer: Self-pay | Admitting: Internal Medicine

## 2018-03-07 DIAGNOSIS — C250 Malignant neoplasm of head of pancreas: Secondary | ICD-10-CM | POA: Diagnosis not present

## 2018-03-07 DIAGNOSIS — K903 Pancreatic steatorrhea: Secondary | ICD-10-CM

## 2018-03-07 DIAGNOSIS — K831 Obstruction of bile duct: Secondary | ICD-10-CM | POA: Diagnosis not present

## 2018-03-07 MED ORDER — PANCRELIPASE (LIP-PROT-AMYL) 12000-38000 UNITS PO CPEP: 24000.0000 [IU] | ORAL_CAPSULE | Freq: Three times a day (TID) | ORAL | 5 refills | Status: AC

## 2018-03-07 NOTE — Progress Notes (Signed)
Presenting complaint;  Patient is here to discuss results of recent CT.  Database and subjective:  Patient is a 84 year old Caucasian female who is here for scheduled visit to discuss results of recent CT.  She is accompanied by HER-2 children and her daughter-in-law. She has history of choledocholithiasis for at least 5 years.  She underwent multiple ERCPs with removal of stones. On her ERCP of October 2018 she was noted to have distal bile duct stricture.  [facility] cytology was negative.  I felt the stricture possible with benign due to chronic choledocholithiasis.  Multiple images studies failed to show pancreatic mass.  It was decided to place a Wallstent. She has not had any abdominal pain or biliary symptoms.  She began to lose weight about 4 months ago.  Around the same time she also had diarrhea.  She has lost over 20 pounds despite having a good appetite.  She also has noted change in order to her stool.  No history of melena rectal bleeding nausea vomiting fever or chills. She was seen by Ms. Setzer NP and stool studies were negative. Similarly chemistry panel was normal 1 month ago.  AST was 19 and ALT was 15.  Albumin was 3.5. She underwent abdominal pelvic CT on 02/21/2018 which revealed large hiatal hernia and a new finding of markedly dilated pancreatic duct with obstruction in the region of had a concerning for a mass/neoplasm.  Wall stent was in place with pneumobilia suggesting stent patency. Patient has not developed any new symptoms since her last visit.   Current Medications: Outpatient Encounter Medications as of 03/07/2018  Medication Sig  . acetaminophen (TYLENOL) 325 MG tablet Take 325 mg by mouth every 6 (six) hours as needed (pain/headache).   Marland Kitchen apixaban (ELIQUIS) 2.5 MG TABS tablet Take 1 tablet (2.5 mg total) by mouth 2 (two) times daily.  . clotrimazole-betamethasone (LOTRISONE) cream Apply 1 application topically 2 (two) times daily as needed (for rash.).  Marland Kitchen  diltiazem (CARDIZEM CD) 120 MG 24 hr capsule TAKE 1 CAPSULE BY MOUTH ONCE DAILY  . levothyroxine (SYNTHROID, LEVOTHROID) 100 MCG tablet Take 100 mcg by mouth daily before breakfast.    No facility-administered encounter medications on file as of 03/07/2018.      Objective:  Elderly thin Caucasian female in NAD. Conjunctiva is pink and sclerae nonicteric. No thyromegaly or lymphadenopathy noted. Abdomen is flat and soft with palpable left lobe of the liver.  No masses or splenomegaly. No peripheral edema or clubbing noted.  Labs/studies Results:  CT images reviewed with patient and her family members and compared with prior studies.  Assessment:  #1.  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  She was diagnosed with bile duct stricture last year and cytology was negative.  In retrospect this would appear to be due to pancreatic neoplasm.  She would benefit from pancreatic enzyme supplement.  #2.  CT findings concerning for malignant pancreatic neoplasm.  She has obstruction to pancreatic duct and head region with marked dilation upstream last year she was diagnosed with distal CBD stricture treated with wall stenting.  Her LFTs are normal.  Therefore stent remains patent. I do not believe patient is a candidate for further evaluation or palliative therapy.  However if patient and her family request oncology referral I would be glad to arrange it.   Plan:  Patient will go to the lab for  CA-19-9. Begin Creon 20K two capsule with each meal and one with snack.  Total dose would be 8 capsules/day. She will monitor frequency and consistency of stools and call with progress report in 7 to 10 days at which time pancreatic enzyme supplement dose would be adjusted. Office visit in 1 month.

## 2018-03-07 NOTE — Patient Instructions (Signed)
Physician will call with results of blood test when completed. Please keep stool diary and call with progress report in 7 to 10 days.

## 2018-03-12 ENCOUNTER — Telehealth (INDEPENDENT_AMBULATORY_CARE_PROVIDER_SITE_OTHER): Payer: Self-pay | Admitting: Internal Medicine

## 2018-03-12 NOTE — Telephone Encounter (Signed)
Patients son called stated Creon was prescribed and the cost is too expensive - wanted to know if there is anything else she could take that's more affordable - please call son at [phone]

## 2018-03-13 DIAGNOSIS — C250 Malignant neoplasm of head of pancreas: Secondary | ICD-10-CM | POA: Diagnosis not present

## 2018-03-13 NOTE — Telephone Encounter (Signed)
Samples of the 24000 units were given to the patient.

## 2018-03-14 LAB — CANCER ANTIGEN 19-9: CA 19-9: 4450 U/mL — ABNORMAL HIGH (ref <34)

## 2018-03-14 NOTE — Telephone Encounter (Signed)
Patient trying samples of Creon.

## 2018-03-20 ENCOUNTER — Other Ambulatory Visit (INDEPENDENT_AMBULATORY_CARE_PROVIDER_SITE_OTHER): Payer: Self-pay | Admitting: *Deleted

## 2018-03-20 DIAGNOSIS — K8689 Other specified diseases of pancreas: Secondary | ICD-10-CM

## 2018-03-20 MED ORDER — PANCRELIPASE (LIP-PROT-AMYL) 24000-76000 UNITS PO CPEP: 1.0000 | ORAL_CAPSULE | Freq: Every day | ORAL | 11 refills | Status: DC

## 2018-03-20 NOTE — Telephone Encounter (Signed)
Hard copy of Creon to family member

## 2018-03-25 ENCOUNTER — Telehealth (INDEPENDENT_AMBULATORY_CARE_PROVIDER_SITE_OTHER): Payer: Self-pay | Admitting: *Deleted

## 2018-03-25 NOTE — Telephone Encounter (Signed)
Patient's family are requesting a referral to Oncology  ([facility])for for further evaluation pancreatic neoplasm , elevated CA-19-9.

## 2018-03-26 NOTE — Telephone Encounter (Signed)
Referral placed in Epic,t hey will contact pt with appt

## 2018-04-01 ENCOUNTER — Inpatient Hospital Stay (HOSPITAL_COMMUNITY): Payer: Medicare Other | Attending: Hematology | Admitting: Hematology

## 2018-04-01 ENCOUNTER — Encounter (HOSPITAL_COMMUNITY): Payer: Self-pay | Admitting: Hematology

## 2018-04-01 ENCOUNTER — Other Ambulatory Visit: Payer: Self-pay

## 2018-04-01 VITALS — BP 145/75 | HR 75 | Temp 97.7°F | Resp 18 | Wt 93.9 lb

## 2018-04-01 DIAGNOSIS — I4891 Unspecified atrial fibrillation: Secondary | ICD-10-CM | POA: Diagnosis not present

## 2018-04-01 DIAGNOSIS — K8689 Other specified diseases of pancreas: Secondary | ICD-10-CM | POA: Insufficient documentation

## 2018-04-01 DIAGNOSIS — C259 Malignant neoplasm of pancreas, unspecified: Secondary | ICD-10-CM | POA: Insufficient documentation

## 2018-04-01 DIAGNOSIS — R935 Abnormal findings on diagnostic imaging of other abdominal regions, including retroperitoneum: Secondary | ICD-10-CM | POA: Insufficient documentation

## 2018-04-01 DIAGNOSIS — Z7901 Long term (current) use of anticoagulants: Secondary | ICD-10-CM | POA: Insufficient documentation

## 2018-04-01 NOTE — Progress Notes (Signed)
[facility] NOTE  Patient Care Team: Sinda Du, MD as PCP - General (Internal Medicine) Herminio Commons, MD as Attending Physician (Cardiology)  CHIEF COMPLAINTS/PURPOSE OF CONSULTATION:  Possible pancreatic cancer.  HISTORY OF PRESENTING ILLNESS:  Regina [name] 84 y.o. female is seen in consultation today for work-up of possible pancreatic cancer.  She has lost 20+ pounds since February of this year.  She was evaluated by Dr. Laural Golden who ordered a CT scan.  CT of the abdomen and pelvis with contrast on 02/21/2018 showed new severe pancreatic duct dilatation with apparent obstruction in the head of the pancreas.  This was concerning for a pancreatic neoplasm.  No evidence of metastatic disease was seen.  CA 19-9 was elevated at 4450.  She does not report any new onset abdominal pains.  Patient lives at home by herself.  She is accompanied by her daughter and son and their spouses.  She is able to cook breakfast and lunch.  Son and daughter-in-law live close by and they bring her dinner.  Since she has been started on Creon about a week ago she has gained back about 3 pounds.  Denies any fevers or night sweats.  She walks with the help of walker as she had left hip replacement which did not set well.  She was a Radio producer prior to retirement.  Her younger brother died of stomach cancer.  No other malignancies was known in the family.  Energy levels are 50% and appetite is 75%.  MEDICAL HISTORY:  Past Medical History:  Diagnosis Date  . Anemia   . Arthritis   . Avascular necrosis of bone of left hip ([place])   . Dysrhythmia 4/14   atrial fib post op  . H/O hiatal hernia   . History of blood transfusion   . Hypertension   . Hypothyroidism   . Stroke [facility]) 2003   No deficits    SURGICAL HISTORY: Past Surgical History:  Procedure Laterality Date  . BALLOON DILATION N/A 05/05/2016   Procedure: BALLOON DILATION OF SPHINCTEROTOMY;  Surgeon: Rogene Houston, MD;  [facility];  Service: Endoscopy;  Laterality: N/A;  . ERCP N/A 03/31/2016   Procedure: ENDOSCOPIC RETROGRADE CHOLANGIOPANCREATOGRAPHY (ERCP);  Surgeon: Rogene Houston, MD;  [facility];  Service: Endoscopy;  Laterality: N/A;  . ERCP N/A 05/05/2016   Procedure: ENDOSCOPIC RETROGRADE CHOLANGIOPANCREATOGRAPHY (ERCP);  Surgeon: Rogene Houston, MD;  [facility];  Service: Endoscopy;  Laterality: N/A;  do NOT move per [facility]  . ERCP N/A 11/11/2016   Procedure: ENDOSCOPIC RETROGRADE CHOLANGIOPANCREATOGRAPHY (ERCP);  Surgeon: Ronnette Juniper, MD;  Location: [place];  Service: Gastroenterology;  Laterality: N/A;  PRONE POSITION  . ERCP N/A 12/29/2016   Procedure: ENDOSCOPIC RETROGRADE CHOLANGIOPANCREATOGRAPHY (ERCP) WITH STENT EXCHANGE;  Surgeon: Rogene Houston, MD;  [facility];  Service: Endoscopy;  Laterality: N/A;  . ERCP N/A 03/09/2017   Procedure: ENDOSCOPIC RETROGRADE CHOLANGIOPANCREATOGRAPHY (ERCP);  Surgeon: Rogene Houston, MD;  [facility];  Service: Endoscopy;  Laterality: N/A;  . EYE SURGERY Right    cataract removal  . GASTROINTESTINAL STENT REMOVAL N/A 05/05/2016   Procedure: Biliary stent removal.;  Surgeon: Rogene Houston, MD;  [facility];  Service: Endoscopy;  Laterality: N/A;  . GASTROINTESTINAL STENT REMOVAL N/A 03/09/2017   Procedure: BILARY STENT REMOVAL, BILIARY STENT PLACEMENT;  Surgeon: Rogene Houston, MD;  [facility];  Service: Endoscopy;  Laterality: N/A;  .  HIP PINNING,CANNULATED Left 09/13/2012   Procedure: ASNIS HIP PINNING LEFT;  Surgeon: Sanjuana Kava, MD;  [facility];  Service: Orthopedics;  Laterality: Left;  . LITHOTRIPSY N/A 03/31/2016   Procedure: MECHANICAL LITHOTRIPSY;  Surgeon: Rogene Houston, MD;  [facility];  Service: Endoscopy;  Laterality: N/A;  . Mouth Sugery     Upper teeth impacted  . REMOVAL OF STONES N/A 03/31/2016   Procedure:  PARTIAL RETRIEVAL OF STONE AND STENTING;  Surgeon: Rogene Houston, MD;  [facility];  Service: Endoscopy;  Laterality: N/A;  . REMOVAL OF STONES N/A 05/05/2016   Procedure: REMOVAL OF STONES;  Surgeon: Rogene Houston, MD;  [facility];  Service: Endoscopy;  Laterality: N/A;  . SPHINCTEROTOMY N/A 03/31/2016   Procedure: SPHINCTEROTOMY;  Surgeon: Rogene Houston, MD;  [facility];  Service: Endoscopy;  Laterality: N/A;  . SPYGLASS CHOLANGIOSCOPY N/A 05/05/2016   Procedure: IONGEXBM CHOLANGIOSCOPY;  Surgeon: Rogene Houston, MD;  [facility];  Service: Endoscopy;  Laterality: N/A;  . SPYGLASS CHOLANGIOSCOPY N/A 03/09/2017   Procedure: WUXLKGMW CHOLANGIOSCOPY;  Surgeon: Rogene Houston, MD;  [facility];  Service: Endoscopy;  Laterality: N/A;  . TOTAL HIP ARTHROPLASTY Left 10/03/2013   Procedure: LEFT TOTAL HIP ARTHROPLASTY ANTERIOR APPROACH and REMOVAL OF SCREWS LEFT HIP;  Surgeon: Mcarthur Rossetti, MD;  Location: [facility];  Service: Orthopedics;  Laterality: Left;    SOCIAL HISTORY: Social History   Socioeconomic History  . Marital status: Widowed    Spouse name: Not on file  . Number of children: Not on file  . Years of education: Not on file  . Highest education level: Not on file  Occupational History  . Not on file  Social Needs  . Financial resource strain: Not hard at all  . Food insecurity:    Worry: Never true    Inability: Never true  . Transportation needs:    Medical: Yes    Non-medical: Yes  Tobacco Use  . Smoking status: Never Smoker  . Smokeless tobacco: Never Used  . Tobacco comment: Smoked a few times in college (socially)  Substance and Sexual Activity  . Alcohol use: No    Alcohol/week: 0.0 standard drinks    Comment: None since Christmas 2013  . Drug use: No  . Sexual activity: Never    Birth control/protection: None  Lifestyle  . Physical activity:    Days per week: 0 days    Minutes per  session: 0 min  . Stress: Only a little  Relationships  . Social connections:    Talks on phone: More than three times a week    Gets together: More than three times a week    Attends religious service: Never    Active member of club or organization: Yes    Attends meetings of clubs or organizations: Never    Relationship status: Widowed  . Intimate partner violence:    Fear of current or ex partner: Patient refused    Emotionally abused: Patient refused    Physically abused: Patient refused    Forced sexual activity: Patient refused  Other Topics Concern  . Not on file  Social History Narrative  . Not on file    FAMILY HISTORY: Family History  Problem Relation Age of Onset  . Heart attack Mother   . Heart attack Father   . Cancer Brother     ALLERGIES:  has No Known Allergies.  MEDICATIONS:  Current Outpatient Medications  Medication Sig Dispense Refill  . acetaminophen (TYLENOL) 325 MG tablet Take 325 mg by mouth every 6 (six) hours as needed (pain/headache).     Marland Kitchen apixaban (ELIQUIS) 2.5 MG TABS tablet Take 1 tablet (2.5 mg total) by mouth 2 (two) times daily. 60 tablet 6  . clotrimazole-betamethasone (LOTRISONE) cream Apply 1 application topically 2 (two) times daily as needed (for rash.).    Marland Kitchen diltiazem (CARDIZEM CD) 120 MG 24 hr capsule TAKE 1 CAPSULE BY MOUTH ONCE DAILY 90 capsule 3  . levothyroxine (SYNTHROID, LEVOTHROID) 125 MCG tablet TK 1 T PO QD  12  . lipase/protease/amylase (CREON) 12000 units CPEP capsule Take 2 capsules (24,000 Units total) by mouth 3 (three) times daily with meals. 1 capsule with each snack.  Can have 2 snacks per day. 240 capsule 5   No current facility-administered medications for this visit.     REVIEW OF SYSTEMS:   Constitutional: Denies fevers, chills or abnormal night sweats Eyes: Denies blurriness of vision, double vision or watery eyes Ears, nose, mouth, throat, and face: Denies mucositis or sore throat Respiratory: Denies  cough, dyspnea or wheezes Cardiovascular: Denies palpitation, chest discomfort or lower extremity swelling Gastrointestinal: Denies any nausea or vomiting but has diarrhea. Skin: Denies abnormal skin rashes Lymphatics: Denies new lymphadenopathy or easy bruising Neurological:Denies numbness, tingling or new weaknesses Behavioral/Psych: Mood is stable, no new changes  All other systems were reviewed with the patient and are negative.  PHYSICAL EXAMINATION: ECOG PERFORMANCE STATUS: 2 - Symptomatic, <50% confined to bed  Vitals:   04/01/18 1317  BP: (!) 145/75  Pulse: 75  Resp: 18  Temp: 97.7 F (36.5 C)  SpO2: 98%   Filed Weights   04/01/18 1317  Weight: 93 lb 14.4 oz (42.6 kg)    GENERAL:alert, no distress and comfortable SKIN: skin color, texture, turgor are normal, no rashes or significant lesions EYES: normal, conjunctiva are pink and non-injected, sclera clear OROPHARYNX: No oropharyngeal lesions. NECK: supple, thyroid normal size, non-tender, without nodularity LYMPH:  no palpable lymphadenopathy in the cervical, axillary or inguinal LUNGS: clear to auscultation and percussion with normal breathing effort HEART: Regular rate and rhythm.  Trace lower extremity edema. ABDOMEN:abdomen soft, non-tender and normal bowel sounds Musculoskeletal:no cyanosis of digits and no clubbing  PSYCH: alert & oriented x 3 with fluent speech NEURO: no focal motor/sensory deficits  LABORATORY DATA:  I have reviewed the data as listed Lab Results  Component Value Date   WBC 7.5 03/05/2017   HGB 14.0 03/05/2017   HCT 42.1 03/05/2017   MCV 88.4 03/05/2017   PLT 195 03/05/2017     Chemistry      Component Value Date/Time   NA 137 02/05/2018 1501   K 4.3 02/05/2018 1501   CL 101 02/05/2018 1501   CO2 30 02/05/2018 1501   BUN 14 02/05/2018 1501   CREATININE 1.12 (H) 02/05/2018 1501      Component Value Date/Time   CALCIUM 8.6 02/05/2018 1501   ALKPHOS 69 12/31/2016 0441   AST  19 02/05/2018 1501   ALT 15 02/05/2018 1501   BILITOT 0.5 02/05/2018 1501   BILITOT 0.6 07/10/2016 1220       RADIOGRAPHIC STUDIES: I have personally reviewed CT scan of the abdomen and pelvis dated 02/21/2018 and discussed with the patient.   ASSESSMENT & PLAN:  Malignant neoplasm of pancreas ([place]) 1.  Possible pancreatic cancer: - Patient has a history of choledocholithiasis.  She had developed  a stricture of her bile duct and had stents put in in 2018. - She had unintentional weight loss of 20 pounds since February of this year. - CT scan of the abdomen and pelvis on 02/21/2018 showed new dilatation of the pancreatic duct compared to MRI in June 2018.  Dilatation extends from pancreatic head to the tail with the duct measuring 15 to 16 mm.  No masses visualized.  No evidence of metastatic disease.  There was a suspicion for pancreatic neoplasm. - CA 19-9 was elevated at 4450. - Patient has been started on Creon for the last 1 week and has gained about 3 pounds.  She is eating better. - I have recommended doing a PET CT scan to see if there is any evidence of pancreatic cancer.  I think CA 19-9 is disproportionately high without any evidence of mass lesion.  Patient is agreeable to this option. -I will see her back after the PET CT scan.  2.  Atrial fibrillation: -She is on Cardizem and Eliquis 2.5 mg.  3.  Pancreatic insufficiency: - She takes 2 capsules of Creon 3 times a day and 1 capsule with each snack.  Orders Placed This Encounter  Procedures  . NM PET Image Initial (PI) Skull Base To Thigh    Standing Status:   Future    Standing Expiration Date:   04/01/2019    Order Specific Question:   ** REASON FOR EXAM (FREE TEXT)    Answer:   Pancreatic cancer suspicion on CT scan    Order Specific Question:   If indicated for the ordered procedure, I authorize the administration of a radiopharmaceutical per Radiology protocol    Answer:   Yes    Order Specific Question:    Preferred imaging location?    Answer:   [facility]    Order Specific Question:   Radiology Contrast Protocol - do NOT remove file path    Answer:   [email or web address]    All questions were answered. The patient knows to call the clinic with any problems, questions or concerns.     Derek Jack, MD 04/01/2018 1:51 PM

## 2018-04-01 NOTE — Assessment & Plan Note (Addendum)
1.  Possible pancreatic cancer: - Patient has a history of choledocholithiasis.  She had developed a stricture of her bile duct and had stents put in in 2018. - She had unintentional weight loss of 20 pounds since February of this year. - CT scan of the abdomen and pelvis on 02/21/2018 showed new dilatation of the pancreatic duct compared to MRI in June 2018.  Dilatation extends from pancreatic head to the tail with the duct measuring 15 to 16 mm.  No masses visualized.  No evidence of metastatic disease.  There was a suspicion for pancreatic neoplasm. - CA 19-9 was elevated at 4450. - Patient has been started on Creon for the last 1 week and has gained about 3 pounds.  She is eating better. - I have recommended doing a PET CT scan to see if there is any evidence of pancreatic cancer.  I think CA 19-9 is disproportionately high without any evidence of mass lesion.  Patient is agreeable to this option. -I will see her back after the PET CT scan.  2.  Atrial fibrillation: -She is on Cardizem and Eliquis 2.5 mg.  3.  Pancreatic insufficiency: - She takes 2 capsules of Creon 3 times a day and 1 capsule with each snack.

## 2018-04-04 ENCOUNTER — Ambulatory Visit (INDEPENDENT_AMBULATORY_CARE_PROVIDER_SITE_OTHER): Payer: Self-pay | Admitting: Internal Medicine

## 2018-04-15 ENCOUNTER — Ambulatory Visit (HOSPITAL_COMMUNITY)
Admission: RE | Admit: 2018-04-15 | Discharge: 2018-04-15 | Disposition: A | Discharge status: Home / Self Care | Payer: Medicare Other | Source: Ambulatory Visit | Attending: Hematology | Admitting: Hematology

## 2018-04-15 DIAGNOSIS — C259 Malignant neoplasm of pancreas, unspecified: Secondary | ICD-10-CM | POA: Diagnosis not present

## 2018-04-15 DIAGNOSIS — K8689 Other specified diseases of pancreas: Secondary | ICD-10-CM | POA: Diagnosis not present

## 2018-04-15 MED ORDER — FLUDEOXYGLUCOSE F - 18 (FDG) INJECTION
8.0000 | Freq: Once | INTRAVENOUS | Status: AC | PRN
  Administered 2018-04-15: 8 via INTRAVENOUS

## 2018-04-16 ENCOUNTER — Ambulatory Visit (INDEPENDENT_AMBULATORY_CARE_PROVIDER_SITE_OTHER): Payer: Self-pay | Admitting: Internal Medicine

## 2018-04-24 ENCOUNTER — Inpatient Hospital Stay (HOSPITAL_COMMUNITY): Payer: Medicare Other | Attending: Hematology | Admitting: Hematology

## 2018-04-24 ENCOUNTER — Encounter (HOSPITAL_COMMUNITY): Payer: Self-pay | Admitting: Hematology

## 2018-04-24 VITALS — BP 156/66 | HR 89 | Temp 97.5°F | Resp 20 | Wt 88.5 lb

## 2018-04-24 DIAGNOSIS — C259 Malignant neoplasm of pancreas, unspecified: Secondary | ICD-10-CM

## 2018-04-24 DIAGNOSIS — I4891 Unspecified atrial fibrillation: Secondary | ICD-10-CM | POA: Insufficient documentation

## 2018-04-24 DIAGNOSIS — Z23 Encounter for immunization: Secondary | ICD-10-CM | POA: Diagnosis not present

## 2018-04-24 DIAGNOSIS — Z79899 Other long term (current) drug therapy: Secondary | ICD-10-CM | POA: Insufficient documentation

## 2018-04-24 DIAGNOSIS — K8689 Other specified diseases of pancreas: Secondary | ICD-10-CM | POA: Diagnosis not present

## 2018-04-24 DIAGNOSIS — Z7901 Long term (current) use of anticoagulants: Secondary | ICD-10-CM | POA: Insufficient documentation

## 2018-04-24 DIAGNOSIS — R978 Other abnormal tumor markers: Secondary | ICD-10-CM | POA: Diagnosis not present

## 2018-04-24 MED ORDER — INFLUENZA VAC SPLIT HIGH-DOSE 0.5 ML IM SUSY
PREFILLED_SYRINGE | INTRAMUSCULAR | Status: AC
  Filled 2018-04-24: qty 0.5

## 2018-04-24 MED ORDER — INFLUENZA VAC SPLIT HIGH-DOSE 0.5 ML IM SUSY
0.5000 mL | PREFILLED_SYRINGE | Freq: Once | INTRAMUSCULAR | Status: AC
  Administered 2018-04-24: 0.5 mL via INTRAMUSCULAR

## 2018-04-24 NOTE — Progress Notes (Signed)
Regina [name], [place] [address]   CLINIC:  Medical Oncology/Hematology  PCP:  Sinda Du, [place] [address] [phone]   REASON FOR VISIT: Follow-up for pancreatic mass  CURRENT THERAPY: work up   INTERVAL HISTORY:  [name] 84 y.o. female returns for routine follow-up for pancreatic mass. Patient is here today with her whole family. She is feeling good with occasional fatigue and weakness. She is unsure if she wants to proceed with any form of treatment. She reports her appetite and energy level at 50%. She has lost 5 pounds since her last visit. She does drink ensure daily to help with her nutrition. She has some new pain in her upper back she thinks is from overworking herself. It has only been present for 1 week. She denies any nausea, vomiting, or diarrhea. Denies any fevers or recent infections. Denies any bleeding.     REVIEW OF SYSTEMS:  Review of Systems  Constitutional: Positive for fatigue.  Neurological: Positive for extremity weakness.  All other systems reviewed and are negative.    PAST MEDICAL/SURGICAL HISTORY:  Past Medical History:  Diagnosis Date  . Anemia   . Arthritis   . Avascular necrosis of bone of left hip ([place])   . Dysrhythmia 4/14   atrial fib post op  . H/O hiatal hernia   . History of blood transfusion   . Hypertension   . Hypothyroidism   . Stroke [facility]) 2003   No deficits   Past Surgical History:  Procedure Laterality Date  . BALLOON DILATION N/A 05/05/2016   Procedure: BALLOON DILATION OF SPHINCTEROTOMY;  Surgeon: Rogene Houston, MD;  [facility];  Service: Endoscopy;  Laterality: N/A;  . ERCP N/A 03/31/2016   Procedure: ENDOSCOPIC RETROGRADE CHOLANGIOPANCREATOGRAPHY (ERCP);  Surgeon: Rogene Houston, MD;  [facility];  Service: Endoscopy;  Laterality: N/A;  . ERCP N/A 05/05/2016   Procedure: ENDOSCOPIC RETROGRADE CHOLANGIOPANCREATOGRAPHY  (ERCP);  Surgeon: Rogene Houston, MD;  [facility];  Service: Endoscopy;  Laterality: N/A;  do NOT move per [facility]  . ERCP N/A 11/11/2016   Procedure: ENDOSCOPIC RETROGRADE CHOLANGIOPANCREATOGRAPHY (ERCP);  Surgeon: Ronnette Juniper, MD;  Location: [place];  Service: Gastroenterology;  Laterality: N/A;  PRONE POSITION  . ERCP N/A 12/29/2016   Procedure: ENDOSCOPIC RETROGRADE CHOLANGIOPANCREATOGRAPHY (ERCP) WITH STENT EXCHANGE;  Surgeon: Rogene Houston, MD;  [facility];  Service: Endoscopy;  Laterality: N/A;  . ERCP N/A 03/09/2017   Procedure: ENDOSCOPIC RETROGRADE CHOLANGIOPANCREATOGRAPHY (ERCP);  Surgeon: Rogene Houston, MD;  [facility];  Service: Endoscopy;  Laterality: N/A;  . EYE SURGERY Right    cataract removal  . GASTROINTESTINAL STENT REMOVAL N/A 05/05/2016   Procedure: Biliary stent removal.;  Surgeon: Rogene Houston, MD;  [facility];  Service: Endoscopy;  Laterality: N/A;  . GASTROINTESTINAL STENT REMOVAL N/A 03/09/2017   Procedure: BILARY STENT REMOVAL, BILIARY STENT PLACEMENT;  Surgeon: Rogene Houston, MD;  [facility];  Service: Endoscopy;  Laterality: N/A;  . HIP PINNING,CANNULATED Left 09/13/2012   Procedure: ASNIS HIP PINNING LEFT;  Surgeon: Sanjuana Kava, MD;  [facility];  Service: Orthopedics;  Laterality: Left;  . LITHOTRIPSY N/A 03/31/2016   Procedure: MECHANICAL LITHOTRIPSY;  Surgeon: Rogene Houston, MD;  [facility];  Service: Endoscopy;  Laterality: N/A;  . Mouth Sugery     Upper teeth impacted  . REMOVAL OF STONES  N/A 03/31/2016   Procedure: PARTIAL RETRIEVAL OF STONE AND STENTING;  Surgeon: Rogene Houston, MD;  [facility];  Service: Endoscopy;  Laterality: N/A;  . REMOVAL OF STONES N/A 05/05/2016   Procedure: REMOVAL OF STONES;  Surgeon: Rogene Houston, MD;  [facility];  Service: Endoscopy;  Laterality: N/A;  . SPHINCTEROTOMY N/A 03/31/2016   Procedure:  SPHINCTEROTOMY;  Surgeon: Rogene Houston, MD;  [facility];  Service: Endoscopy;  Laterality: N/A;  . SPYGLASS CHOLANGIOSCOPY N/A 05/05/2016   Procedure: IHKVQQVZ CHOLANGIOSCOPY;  Surgeon: Rogene Houston, MD;  [facility];  Service: Endoscopy;  Laterality: N/A;  . SPYGLASS CHOLANGIOSCOPY N/A 03/09/2017   Procedure: DGLOVFIE CHOLANGIOSCOPY;  Surgeon: Rogene Houston, MD;  [facility];  Service: Endoscopy;  Laterality: N/A;  . TOTAL HIP ARTHROPLASTY Left 10/03/2013   Procedure: LEFT TOTAL HIP ARTHROPLASTY ANTERIOR APPROACH and REMOVAL OF SCREWS LEFT HIP;  Surgeon: Mcarthur Rossetti, MD;  [facility];  Service: Orthopedics;  Laterality: Left;     SOCIAL HISTORY:  Social History   Socioeconomic History  . Marital status: Widowed    Spouse name: Not on file  . Number of children: Not on file  . Years of education: Not on file  . Highest education level: Not on file  Occupational History  . Not on file  Social Needs  . Financial resource strain: Not hard at all  . Food insecurity:    Worry: Never true    Inability: Never true  . Transportation needs:    Medical: Yes    Non-medical: Yes  Tobacco Use  . Smoking status: Never Smoker  . Smokeless tobacco: Never Used  . Tobacco comment: Smoked a few times in college (socially)  Substance and Sexual Activity  . Alcohol use: No    Alcohol/week: 0.0 standard drinks    Comment: None since Christmas 2013  . Drug use: No  . Sexual activity: Never    Birth control/protection: None  Lifestyle  . Physical activity:    Days per week: 0 days    Minutes per session: 0 min  . Stress: Only a little  Relationships  . Social connections:    Talks on phone: More than three times a week    Gets together: More than three times a week    Attends religious service: Never    Active member of club or organization: Yes    Attends meetings of clubs or organizations: Never    Relationship status: Widowed    . Intimate partner violence:    Fear of current or ex partner: Patient refused    Emotionally abused: Patient refused    Physically abused: Patient refused    Forced sexual activity: Patient refused  Other Topics Concern  . Not on file  Social History Narrative  . Not on file    FAMILY HISTORY:  Family History  Problem Relation Age of Onset  . Heart attack Mother   . Heart attack Father   . Cancer Brother     CURRENT MEDICATIONS:  Outpatient Encounter Medications as of 04/24/2018  Medication Sig  . acetaminophen (TYLENOL) 325 MG tablet Take 325 mg by mouth every 6 (six) hours as needed (pain/headache).   Marland Kitchen apixaban (ELIQUIS) 2.5 MG TABS tablet Take 1 tablet (2.5 mg total) by mouth 2 (two) times daily.  . clotrimazole-betamethasone (LOTRISONE) cream Apply 1 application topically 2 (two) times daily as needed (for rash.).  Marland Kitchen diltiazem (  CARDIZEM CD) 120 MG 24 hr capsule TAKE 1 CAPSULE BY MOUTH ONCE DAILY  . levothyroxine (SYNTHROID, LEVOTHROID) 125 MCG tablet TK 1 T PO QD  . lipase/protease/amylase (CREON) 12000 units CPEP capsule Take 2 capsules (24,000 Units total) by mouth 3 (three) times daily with meals. 1 capsule with each snack.  Can have 2 snacks per day.  . [EXPIRED] Influenza vac split quadrivalent PF (FLUZONE HIGH-DOSE) injection 0.5 mL    No facility-administered encounter medications on file as of 04/24/2018.     ALLERGIES:  No Known Allergies   PHYSICAL EXAM:  ECOG Performance status: 1  Vitals:   04/24/18 1000  BP: (!) 156/66  Pulse: 89  Resp: 20  Temp: (!) 97.5 F (36.4 C)  SpO2: 99%   Filed Weights   04/24/18 1000  Weight: 88 lb 8 oz (40.1 kg)    Physical Exam  Constitutional: She is oriented to person, place, and time. She appears well-developed and well-nourished.  Musculoskeletal: Normal range of motion.  Neurological: She is alert and oriented to person, place, and time.  Skin: Skin is warm and dry.  Psychiatric: She has a normal mood and  affect. Her behavior is normal. Judgment and thought content normal.     LABORATORY DATA:  I have reviewed the labs as listed.  CBC    Component Value Date/Time   WBC 7.5 03/05/2017 1422   RBC 4.76 03/05/2017 1422   HGB 14.0 03/05/2017 1422   HCT 42.1 03/05/2017 1422   PLT 195 03/05/2017 1422   MCV 88.4 03/05/2017 1422   MCH 29.4 03/05/2017 1422   MCHC 33.3 03/05/2017 1422   RDW 13.8 03/05/2017 1422   LYMPHSABS 1.8 03/05/2017 1422   MONOABS 0.6 03/05/2017 1422   EOSABS 0.2 03/05/2017 1422   BASOSABS 0.0 03/05/2017 1422   CMP Latest Ref Rng & Units 02/05/2018 05/03/2017 03/05/2017  Glucose 65 - 139 mg/dL 130 - 114(H)  BUN 7 - 25 mg/dL 14 - 21(H)  Creatinine 0.60 - 0.88 mg/dL 1.12(H) - 1.00  Sodium 135 - 146 mmol/L 137 - 135  Potassium 3.5 - 5.3 mmol/L 4.3 - 4.6  Chloride 98 - 110 mmol/L 101 - 101  CO2 20 - 32 mmol/L 30 - 26  Calcium 8.6 - 10.4 mg/dL 8.6 - 9.1  Total Protein 6.1 - 8.1 g/dL 6.6 7.0 -  Total Bilirubin 0.2 - 1.2 mg/dL 0.5 0.5 -  Alkaline Phos 38 - 126 U/L - - -  AST 10 - 35 U/L 19 14 -  ALT 6 - 29 U/L 15 9 -       DIAGNOSTIC IMAGING:  I have independently reviewed images of the PET CT scan dated 04/16/2018 and discussed with the patient and her family.     ASSESSMENT & PLAN:   Malignant neoplasm of pancreas ([place]) 1.  Possible pancreatic cancer: - Patient has a history of choledocholithiasis.  She had developed a stricture of her bile duct and had stents put in in 2018. - She had unintentional weight loss of 20 pounds since February of this year. - CT scan of the abdomen and pelvis on 02/21/2018 showed new dilatation of the pancreatic duct compared to MRI in June 2018.  Dilatation extends from pancreatic head to the tail with the duct measuring 15 to 16 mm.  No masses visualized.  No evidence of metastatic disease.  There was a suspicion for pancreatic neoplasm. - CA 19-9 was elevated at 4450. - Patient has been started on Creon for  the last 1 week and  has gained about 3 pounds.  She is eating better. - We discussed the PET CT scan dated 04/16/2018 which shows intense hypermetabolic mass in the head of the pancreas most consistent with pancreatic adenocarcinoma.  Hypermetabolic activity is large and measuring 4 x 3 cm.  No evidence of liver metastasis or retroperitoneal nodal uptake.  Focal activity in the descending colon may represent inflammation. - Given her advanced age, I have recommended 2 options.  One is best supportive care in the form of hospice.  Second option is to receive SBRT to slow the progression of her pancreatic cancer.  She would like to think about it and call us back.  We will make a referral to [facility] long radiation oncology at that time. - We will give her a follow-up visit in 2 months.  She will receive a high-dose flu vaccine today.  2.  Atrial fibrillation: -She is on Cardizem and Eliquis 2.5 mg.  3.  Pancreatic insufficiency: - She takes 2 capsules of Creon 3 times a day and 1 capsule with each snack.  Total time spent is 40 minutes with more than 50% of the time spent face-to-face discussing scan results, prognosis, various options and coordination of care.    Orders placed this encounter:  Orders Placed This Encounter  Procedures  . CBC with Differential/Platelet  . Comprehensive metabolic panel  . Cancer antigen 19-9      Derek Jack, MD [place] [phone]

## 2018-04-24 NOTE — Patient Instructions (Signed)
[facility] at [facility] Discharge Instructions     Thank you for choosing  Cancer Center at [facility] to provide your oncology and hematology care.  To afford each patient quality time with our provider, please arrive at least 15 minutes before your scheduled appointment time.   If you have a lab appointment with the Cancer Center please come in thru the  Main Entrance and check in at the main information desk  You need to re-schedule your appointment should you arrive 10 or more minutes late.  We strive to give you quality time with our providers, and arriving late affects you and other patients whose appointments are after yours.  Also, if you no show three or more times for appointments you may be dismissed from the clinic at the providers discretion.     Again, thank you for choosing [facility].  Our hope is that these requests will decrease the amount of time that you wait before being seen by our physicians.       _____________________________________________________________  Should you have questions after your visit to [facility], please contact our office at [phone] between the hours of 8:00 a.m. and 4:30 p.m.  Voicemails left after 4:00 p.m. will not be returned until the following business day.  For prescription refill requests, have your pharmacy contact our office and allow 72 hours.    Cancer Center Support Programs:   > Cancer Support Group  2nd Tuesday of the month 1pm-2pm, Journey Room    

## 2018-04-24 NOTE — Assessment & Plan Note (Signed)
1.  Possible pancreatic cancer: - Patient has a history of choledocholithiasis.  She had developed a stricture of her bile duct and had stents put in in 2018. - She had unintentional weight loss of 20 pounds since February of this year. - CT scan of the abdomen and pelvis on 02/21/2018 showed new dilatation of the pancreatic duct compared to MRI in June 2018.  Dilatation extends from pancreatic head to the tail with the duct measuring 15 to 16 mm.  No masses visualized.  No evidence of metastatic disease.  There was a suspicion for pancreatic neoplasm. - CA 19-9 was elevated at 4450. - Patient has been started on Creon for the last 1 week and has gained about 3 pounds.  She is eating better. - We discussed the PET CT scan dated 04/16/2018 which shows intense hypermetabolic mass in the head of the pancreas most consistent with pancreatic adenocarcinoma.  Hypermetabolic activity is large and measuring 4 x 3 cm.  No evidence of liver metastasis or retroperitoneal nodal uptake.  Focal activity in the descending colon may represent inflammation. - Given her advanced age, I have recommended 2 options.  One is best supportive care in the form of hospice.  Second option is to receive SBRT to slow the progression of her pancreatic cancer.  She would like to think about it and call us back.  We will make a referral to [facility] long radiation oncology at that time. - We will give her a follow-up visit in 2 months.  She will receive a high-dose flu vaccine today.  2.  Atrial fibrillation: -She is on Cardizem and Eliquis 2.5 mg.  3.  Pancreatic insufficiency: - She takes 2 capsules of Creon 3 times a day and 1 capsule with each snack.

## 2018-05-07 ENCOUNTER — Ambulatory Visit (INDEPENDENT_AMBULATORY_CARE_PROVIDER_SITE_OTHER): Payer: Self-pay | Admitting: Internal Medicine

## 2018-05-15 ENCOUNTER — Telehealth (HOSPITAL_COMMUNITY): Payer: Self-pay | Admitting: *Deleted

## 2018-05-15 NOTE — Telephone Encounter (Signed)
Patient's daughter in law stated that the diarrhea has improved however patient is taking Creon after she eats. I instructed daughter in law to have patient take the Creon before her meals. She said she would explain that to [facility]. Rebeccah still hasn't made any decisions about her future care (Radiation vs Hospice). I let Benjamine Mola daughter in law know that she could call us anytime and that we understood if [facility] didn't want either but to call us with any questions that she may have. She was very appreciative of the phone call. I will relay this information to our Nurse [facility].

## 2018-05-16 ENCOUNTER — Encounter (HOSPITAL_COMMUNITY): Payer: Self-pay | Admitting: *Deleted

## 2018-05-16 ENCOUNTER — Emergency Department (HOSPITAL_COMMUNITY): Payer: Medicare Other

## 2018-05-16 ENCOUNTER — Other Ambulatory Visit: Payer: Self-pay

## 2018-05-16 ENCOUNTER — Inpatient Hospital Stay (HOSPITAL_COMMUNITY)
Admission: EM | Admit: 2018-05-16 | Discharge: 2018-05-20 | DRG: 380 | Disposition: A | Discharge status: Hospice | Payer: Medicare Other | Attending: Pulmonary Disease | Admitting: Pulmonary Disease

## 2018-05-16 DIAGNOSIS — E039 Hypothyroidism, unspecified: Secondary | ICD-10-CM | POA: Diagnosis present

## 2018-05-16 DIAGNOSIS — K8681 Exocrine pancreatic insufficiency: Secondary | ICD-10-CM | POA: Diagnosis present

## 2018-05-16 DIAGNOSIS — E43 Unspecified severe protein-calorie malnutrition: Secondary | ICD-10-CM | POA: Diagnosis not present

## 2018-05-16 DIAGNOSIS — Z7189 Other specified counseling: Secondary | ICD-10-CM

## 2018-05-16 DIAGNOSIS — R0902 Hypoxemia: Secondary | ICD-10-CM | POA: Diagnosis not present

## 2018-05-16 DIAGNOSIS — I482 Chronic atrial fibrillation, unspecified: Secondary | ICD-10-CM | POA: Diagnosis present

## 2018-05-16 DIAGNOSIS — Z79890 Hormone replacement therapy (postmenopausal): Secondary | ICD-10-CM

## 2018-05-16 DIAGNOSIS — I4891 Unspecified atrial fibrillation: Secondary | ICD-10-CM | POA: Diagnosis not present

## 2018-05-16 DIAGNOSIS — E876 Hypokalemia: Secondary | ICD-10-CM | POA: Diagnosis present

## 2018-05-16 DIAGNOSIS — R0689 Other abnormalities of breathing: Secondary | ICD-10-CM | POA: Diagnosis not present

## 2018-05-16 DIAGNOSIS — Z8249 Family history of ischemic heart disease and other diseases of the circulatory system: Secondary | ICD-10-CM

## 2018-05-16 DIAGNOSIS — Z7901 Long term (current) use of anticoagulants: Secondary | ICD-10-CM

## 2018-05-16 DIAGNOSIS — I119 Hypertensive heart disease without heart failure: Secondary | ICD-10-CM | POA: Diagnosis present

## 2018-05-16 DIAGNOSIS — Z66 Do not resuscitate: Secondary | ICD-10-CM | POA: Diagnosis not present

## 2018-05-16 DIAGNOSIS — Z79899 Other long term (current) drug therapy: Secondary | ICD-10-CM

## 2018-05-16 DIAGNOSIS — D649 Anemia, unspecified: Secondary | ICD-10-CM | POA: Diagnosis present

## 2018-05-16 DIAGNOSIS — K449 Diaphragmatic hernia without obstruction or gangrene: Secondary | ICD-10-CM | POA: Diagnosis present

## 2018-05-16 DIAGNOSIS — C259 Malignant neoplasm of pancreas, unspecified: Secondary | ICD-10-CM | POA: Diagnosis not present

## 2018-05-16 DIAGNOSIS — Z681 Body mass index (BMI) 19 or less, adult: Secondary | ICD-10-CM | POA: Diagnosis not present

## 2018-05-16 DIAGNOSIS — K315 Obstruction of duodenum: Principal | ICD-10-CM | POA: Diagnosis present

## 2018-05-16 DIAGNOSIS — I499 Cardiac arrhythmia, unspecified: Secondary | ICD-10-CM | POA: Diagnosis not present

## 2018-05-16 DIAGNOSIS — R1110 Vomiting, unspecified: Secondary | ICD-10-CM | POA: Diagnosis not present

## 2018-05-16 DIAGNOSIS — J90 Pleural effusion, not elsewhere classified: Secondary | ICD-10-CM | POA: Diagnosis not present

## 2018-05-16 DIAGNOSIS — K311 Adult hypertrophic pyloric stenosis: Secondary | ICD-10-CM | POA: Diagnosis present

## 2018-05-16 DIAGNOSIS — Z96642 Presence of left artificial hip joint: Secondary | ICD-10-CM | POA: Diagnosis present

## 2018-05-16 DIAGNOSIS — Z515 Encounter for palliative care: Secondary | ICD-10-CM

## 2018-05-16 DIAGNOSIS — I10 Essential (primary) hypertension: Secondary | ICD-10-CM | POA: Diagnosis not present

## 2018-05-16 DIAGNOSIS — R1115 Cyclical vomiting syndrome unrelated to migraine: Secondary | ICD-10-CM | POA: Diagnosis present

## 2018-05-16 DIAGNOSIS — Z9689 Presence of other specified functional implants: Secondary | ICD-10-CM | POA: Diagnosis present

## 2018-05-16 DIAGNOSIS — Z8673 Personal history of transient ischemic attack (TIA), and cerebral infarction without residual deficits: Secondary | ICD-10-CM

## 2018-05-16 LAB — TYPE AND SCREEN
ABO/RH(D): A POS
ANTIBODY SCREEN: NEGATIVE

## 2018-05-16 LAB — COMPREHENSIVE METABOLIC PANEL
ALT: 50 U/L — ABNORMAL HIGH (ref 0–44)
AST: 27 U/L (ref 15–41)
Albumin: 3.5 g/dL (ref 3.5–5.0)
Alkaline Phosphatase: 687 U/L — ABNORMAL HIGH (ref 38–126)
Anion gap: 11 (ref 5–15)
BUN: 15 mg/dL (ref 8–23)
CO2: 30 mmol/L (ref 22–32)
Calcium: 8.8 mg/dL — ABNORMAL LOW (ref 8.9–10.3)
Chloride: 95 mmol/L — ABNORMAL LOW (ref 98–111)
Creatinine, Ser: 1 mg/dL (ref 0.44–1.00)
GFR calc Af Amer: 56 mL/min — ABNORMAL LOW (ref >60)
GFR calc non Af Amer: 49 mL/min — ABNORMAL LOW (ref >60)
Glucose, Bld: 174 mg/dL — ABNORMAL HIGH (ref 70–99)
Potassium: 3.1 mmol/L — ABNORMAL LOW (ref 3.5–5.1)
Sodium: 136 mmol/L (ref 135–145)
Total Bilirubin: 0.9 mg/dL (ref 0.3–1.2)
Total Protein: 7.6 g/dL (ref 6.5–8.1)

## 2018-05-16 LAB — CBC WITH DIFFERENTIAL/PLATELET
Abs Immature Granulocytes: 0.07 10*3/uL (ref 0.00–0.07)
Basophils Absolute: 0 10*3/uL (ref 0.0–0.1)
Basophils Relative: 0 %
EOS ABS: 0 10*3/uL (ref 0.0–0.5)
Eosinophils Relative: 0 %
HEMATOCRIT: 36 % (ref 36.0–46.0)
Hemoglobin: 11.5 g/dL — ABNORMAL LOW (ref 12.0–15.0)
Immature Granulocytes: 1 %
Lymphocytes Relative: 7 %
Lymphs Abs: 1 10*3/uL (ref 0.7–4.0)
MCH: 29.3 pg (ref 26.0–34.0)
MCHC: 31.9 g/dL (ref 30.0–36.0)
MCV: 91.8 fL (ref 80.0–100.0)
MONO ABS: 0.7 10*3/uL (ref 0.1–1.0)
Monocytes Relative: 5 %
NEUTROS ABS: 11.4 10*3/uL — AB (ref 1.7–7.7)
Neutrophils Relative %: 87 %
Platelets: 322 10*3/uL (ref 150–400)
RBC: 3.92 MIL/uL (ref 3.87–5.11)
RDW: 14.4 % (ref 11.5–15.5)
WBC: 13.1 10*3/uL — ABNORMAL HIGH (ref 4.0–10.5)
nRBC: 0 % (ref 0.0–0.2)

## 2018-05-16 LAB — LIPASE, BLOOD: Lipase: 18 U/L (ref 11–51)

## 2018-05-16 MED ORDER — SODIUM CHLORIDE 0.9 % IV SOLN
80.0000 mg | Freq: Once | INTRAVENOUS | Status: DC
  Filled 2018-05-16: qty 80

## 2018-05-16 MED ORDER — SODIUM CHLORIDE 0.9 % IV BOLUS
500.0000 mL | Freq: Once | INTRAVENOUS | Status: DC
  Administered 2018-05-16: 500 mL via INTRAVENOUS

## 2018-05-16 MED ORDER — HYDROCODONE-ACETAMINOPHEN 5-325 MG PO TABS: 1.0000 | ORAL_TABLET | ORAL | 0 refills | Status: DC | PRN

## 2018-05-16 MED ORDER — PROMETHAZINE HCL 25 MG/ML IJ SOLN
6.2500 mg | Freq: Once | INTRAMUSCULAR | Status: AC
  Administered 2018-05-16: 6.25 mg via INTRAVENOUS
  Filled 2018-05-16: qty 1

## 2018-05-16 MED ORDER — PROMETHAZINE HCL 12.5 MG PO TABS: 12.5000 mg | ORAL_TABLET | Freq: Four times a day (QID) | ORAL | 0 refills | Status: AC | PRN

## 2018-05-16 MED ORDER — PANTOPRAZOLE SODIUM 40 MG IV SOLR
INTRAVENOUS | Status: AC
  Filled 2018-05-16: qty 160

## 2018-05-16 MED ORDER — METOCLOPRAMIDE HCL 5 MG/ML IJ SOLN
5.0000 mg | Freq: Once | INTRAMUSCULAR | Status: AC
  Administered 2018-05-17: 5 mg via INTRAVENOUS
  Filled 2018-05-16: qty 2

## 2018-05-16 MED ORDER — SODIUM CHLORIDE 0.9 % IV SOLN
8.0000 mg/h | INTRAVENOUS | Status: DC
  Filled 2018-05-16 (×2): qty 80

## 2018-05-16 MED ORDER — PANTOPRAZOLE SODIUM 40 MG IV SOLR
40.0000 mg | Freq: Once | INTRAVENOUS | Status: DC
  Filled 2018-05-16: qty 40

## 2018-05-16 MED ORDER — ONDANSETRON HCL 4 MG/2ML IJ SOLN
4.0000 mg | Freq: Once | INTRAMUSCULAR | Status: AC
  Administered 2018-05-17: 4 mg via INTRAVENOUS
  Filled 2018-05-16: qty 2

## 2018-05-16 NOTE — ED Triage Notes (Signed)
Patient from home, EMS called for nausea vomiting since 6pm, approximately 10 times.  Started off "clear  To now syrup like emesis.  4mg  Zofran given.

## 2018-05-16 NOTE — ED Provider Notes (Addendum)
[facility] EMERGENCY DEPARTMENT Provider Note   CSN: [id number] Arrival date & time: 05/16/18  2205     History   Chief Complaint Chief Complaint  Patient presents with  . Emesis    HPI Regina [name] is a 84 y.o. female with a history of anemia, history of hiatal hernia, hypertension, CVA and recent diagnosis of pancreatic cancer under the care of Dr. Delton Coombes  presenting with complaints of nausea with emesis which started around 6 PM today.  Her options for ongoing care of her cancer are radiation therapy for slowing the progression versus hospice care per patient and her chart. She has decided against radiation and is still contemplating whether she wants hospice care.  She does endorse that she would accept a blood transfusion if needed.  She has had multiple episodes of simply clear emesis in association with a full sensation in her lower chest and upper abdomen, which has developed into dark emesis with possible blood mix prior to arrival.  She arrived by EMS, was given Zofran during transport which has not improved the vomiting.  She denies abdominal pain or diarrhea.    The history is provided by the patient, a relative and medical records.    Past Medical History:  Diagnosis Date  . Anemia   . Arthritis   . Avascular necrosis of bone of left hip ([place])   . Dysrhythmia 4/14   atrial fib post op  . H/O hiatal hernia   . History of blood transfusion   . Hypertension   . Hypothyroidism   . Stroke [facility]) 2003   No deficits    Patient Active Problem List   Diagnosis Date Noted  . Malignant neoplasm of pancreas ([place]) 04/01/2018  . Sepsis ([place]) 12/29/2016  . Elevated troponin 12/29/2016  . RUQ pain   . Choledochitis   . Calculus of common bile duct with obstruction   . Jaundice 11/10/2016  . Common bile duct stone 04/10/2016  . Choledocholithiasis 02/08/2016  . Surgical wound infection 10/17/2013  . Acute blood loss anemia 10/09/2013  . Constipation 10/09/2013    . Avascular necrosis of bone of left hip ([place]) 10/03/2013  . Status post THR (total hip replacement) 10/03/2013  . Atrial fibrillation ([place]) 09/14/2012  . Hypokalemia 09/14/2012  . Fracture of femoral neck, left ([place]) 09/13/2012  . Essential hypertension, benign 09/13/2012  . Unspecified hypothyroidism 09/13/2012    Past Surgical History:  Procedure Laterality Date  . BALLOON DILATION N/A 05/05/2016   Procedure: BALLOON DILATION OF SPHINCTEROTOMY;  Surgeon: Rogene Houston, MD;  [facility];  Service: Endoscopy;  Laterality: N/A;  . ERCP N/A 03/31/2016   Procedure: ENDOSCOPIC RETROGRADE CHOLANGIOPANCREATOGRAPHY (ERCP);  Surgeon: Rogene Houston, MD;  [facility];  Service: Endoscopy;  Laterality: N/A;  . ERCP N/A 05/05/2016   Procedure: ENDOSCOPIC RETROGRADE CHOLANGIOPANCREATOGRAPHY (ERCP);  Surgeon: Rogene Houston, MD;  [facility];  Service: Endoscopy;  Laterality: N/A;  do NOT move per [facility]  . ERCP N/A 11/11/2016   Procedure: ENDOSCOPIC RETROGRADE CHOLANGIOPANCREATOGRAPHY (ERCP);  Surgeon: Ronnette Juniper, MD;  Location: [place];  Service: Gastroenterology;  Laterality: N/A;  PRONE POSITION  . ERCP N/A 12/29/2016   Procedure: ENDOSCOPIC RETROGRADE CHOLANGIOPANCREATOGRAPHY (ERCP) WITH STENT EXCHANGE;  Surgeon: Rogene Houston, MD;  [facility];  Service: Endoscopy;  Laterality: N/A;  . ERCP N/A 03/09/2017   Procedure: ENDOSCOPIC RETROGRADE CHOLANGIOPANCREATOGRAPHY (ERCP);  Surgeon: Rogene Houston, MD;  [facility];  Service: Endoscopy;  Laterality: N/A;  . EYE SURGERY Right    cataract removal  . GASTROINTESTINAL STENT REMOVAL N/A 05/05/2016   Procedure: Biliary stent removal.;  Surgeon: Rogene Houston, MD;  [facility];  Service: Endoscopy;  Laterality: N/A;  . GASTROINTESTINAL STENT REMOVAL N/A 03/09/2017   Procedure: BILARY STENT REMOVAL, BILIARY STENT PLACEMENT;  Surgeon: Rogene Houston, MD;  [facility];  Service: Endoscopy;  Laterality: N/A;  . HIP PINNING,CANNULATED Left 09/13/2012   Procedure: ASNIS HIP PINNING LEFT;  Surgeon: Sanjuana Kava, MD;  Location: [facility];  Service: Orthopedics;  Laterality: Left;  . LITHOTRIPSY N/A 03/31/2016   Procedure: MECHANICAL LITHOTRIPSY;  Surgeon: Rogene Houston, MD;  [facility];  Service: Endoscopy;  Laterality: N/A;  . Mouth Sugery     Upper teeth impacted  . REMOVAL OF STONES N/A 03/31/2016   Procedure: PARTIAL RETRIEVAL OF STONE AND STENTING;  Surgeon: Rogene Houston, MD;  [facility];  Service: Endoscopy;  Laterality: N/A;  . REMOVAL OF STONES N/A 05/05/2016   Procedure: REMOVAL OF STONES;  Surgeon: Rogene Houston, MD;  [facility];  Service: Endoscopy;  Laterality: N/A;  . SPHINCTEROTOMY N/A 03/31/2016   Procedure: SPHINCTEROTOMY;  Surgeon: Rogene Houston, MD;  [facility];  Service: Endoscopy;  Laterality: N/A;  . SPYGLASS CHOLANGIOSCOPY N/A 05/05/2016   Procedure: BOFBPZWC CHOLANGIOSCOPY;  Surgeon: Rogene Houston, MD;  [facility];  Service: Endoscopy;  Laterality: N/A;  . SPYGLASS CHOLANGIOSCOPY N/A 03/09/2017   Procedure: HENIDPOE CHOLANGIOSCOPY;  Surgeon: Rogene Houston, MD;  [facility];  Service: Endoscopy;  Laterality: N/A;  . TOTAL HIP ARTHROPLASTY Left 10/03/2013   Procedure: LEFT TOTAL HIP ARTHROPLASTY ANTERIOR APPROACH and REMOVAL OF SCREWS LEFT HIP;  Surgeon: Mcarthur Rossetti, MD;  Location: [facility];  Service: Orthopedics;  Laterality: Left;     OB History    Gravida      Para      Term      Preterm      AB      Living  2     SAB      TAB      Ectopic      Multiple      Live Births               Home Medications    Prior to Admission medications   Medication Sig Start Date End Date Taking? Authorizing Provider  acetaminophen (TYLENOL) 325 MG tablet Take 325 mg by mouth every 6 (six) hours as needed (pain/headache).      [provider]  apixaban (ELIQUIS) 2.5 MG TABS tablet Take 1 tablet (2.5 mg total) by mouth 2 (two) times daily. 03/11/17   Rehman, Mechele Dawley, MD  clotrimazole-betamethasone (LOTRISONE) cream Apply 1 application topically 2 (two) times daily as needed (for rash.).    [provider]  diltiazem (CARDIZEM CD) 120 MG 24 hr capsule TAKE 1 CAPSULE BY MOUTH ONCE DAILY 11/19/17   Herminio Commons, MD  HYDROcodone-acetaminophen (NORCO/VICODIN) 5-325 MG tablet Take 1 tablet by mouth every 4 (four) hours as needed. 05/16/18   Evalee Jefferson, PA-C  HYDROcodone-acetaminophen (NORCO/VICODIN) 5-325 MG tablet Take 1 tablet by mouth every 4 (four) hours as needed for moderate pain. 05/16/18   Evalee Jefferson, PA-C  levothyroxine (SYNTHROID, LEVOTHROID) 125 MCG tablet TK 1 T PO QD 01/12/18   [provider]  lipase/protease/amylase (  CREON) 12000 units CPEP capsule Take 2 capsules (24,000 Units total) by mouth 3 (three) times daily with meals. 1 capsule with each snack.  Can have 2 snacks per day. 03/07/18   Rogene Houston, MD  promethazine (PHENERGAN) 12.5 MG tablet Take 1 tablet (12.5 mg total) by mouth every 6 (six) hours as needed for nausea or vomiting. 05/16/18   Evalee Jefferson, PA-C    Family History Family History  Problem Relation Age of Onset  . Heart attack Mother   . Heart attack Father   . Cancer Brother     Social History Social History   Tobacco Use  . Smoking status: Never Smoker  . Smokeless tobacco: Never Used  . Tobacco comment: Smoked a few times in college (socially)  Substance Use Topics  . Alcohol use: No    Alcohol/week: 0.0 standard drinks    Comment: None since Christmas 2013  . Drug use: No     Allergies   Patient has no known allergies.   Review of Systems Review of Systems  Constitutional: Negative for fever.  HENT: Negative.   Eyes: Negative.   Respiratory: Negative for chest tightness and shortness of breath.   Cardiovascular: Negative for  chest pain.  Gastrointestinal: Positive for nausea and vomiting. Negative for abdominal pain and blood in stool.  Genitourinary: Negative.   Musculoskeletal: Negative for arthralgias, joint swelling and neck pain.  Skin: Negative.  Negative for rash and wound.  Neurological: Negative for dizziness, weakness, light-headedness, numbness and headaches.  Psychiatric/Behavioral: Negative.      Physical Exam Updated Vital Signs BP (!) 164/103   Pulse (!) 113   Temp 98.6 F (37 C) (Oral)   Resp (!) 28   Ht 5\' 4"  (1.626 m)   Wt 40.1 kg   SpO2 95%   BMI 15.16 kg/m   Physical Exam Vitals signs and nursing note reviewed.  Constitutional:      Appearance: She is well-developed. She is ill-appearing.  HENT:     Head: Normocephalic and atraumatic.     Mouth/Throat:     Mouth: Mucous membranes are moist.  Cardiovascular:     Rate and Rhythm: Regular rhythm. Tachycardia present.     Heart sounds: Normal heart sounds.  Pulmonary:     Effort: Pulmonary effort is normal.     Breath sounds: Normal breath sounds. No wheezing.  Abdominal:     General: Bowel sounds are normal. There is no distension.     Palpations: Abdomen is soft.     Tenderness: There is no abdominal tenderness. There is no guarding.  Musculoskeletal: Normal range of motion.  Skin:    General: Skin is warm and dry.  Neurological:     Mental Status: She is alert and oriented to person, place, and time.      ED Treatments / Results  Labs (all labs ordered are listed, but only abnormal results are displayed) Labs Reviewed  CBC WITH DIFFERENTIAL/PLATELET - Abnormal; Notable for the following components:      Result Value   WBC 13.1 (*)    Hemoglobin 11.5 (*)    Neutro Abs 11.4 (*)    All other components within normal limits  COMPREHENSIVE METABOLIC PANEL - Abnormal; Notable for the following components:   Potassium 3.1 (*)    Chloride 95 (*)    Glucose, Bld 174 (*)    Calcium 8.8 (*)    ALT 50 (*)     Alkaline Phosphatase 687 (*)  GFR calc non Af Amer 49 (*)    GFR calc Af Amer 56 (*)    All other components within normal limits  LIPASE, BLOOD  TYPE AND SCREEN    EKG EKG Interpretation  Date/Time:  Thursday May 16 2018 22:10:17 EST Ventricular Rate:  89 PR Interval:    QRS Duration: 89 QT Interval:  357 QTC Calculation: 435 R Axis:   125 Text Interpretation:  Atrial fibrillation Right axis deviation Abnormal lateral Q waves Borderline T abnormalities, inferior leads Confirmed by Gerlene Fee [phone]) on 05/16/2018 10:45:51 PM   Radiology Dg Chest Portable 1 View  Result Date: 05/16/2018 CLINICAL DATA:  Vomiting. EXAM: PORTABLE CHEST 1 VIEW COMPARISON:  12/29/2016. FINDINGS: Increased gas within the previously demonstrated large hiatal hernia with an increase in size of the hernia. Stable enlarged cardiac silhouette and ill-defined opacity at the left lung base. Clear right lung. Small bilateral pleural effusions. Old, healed left humeral neck fracture. Diffuse osteopenia. IMPRESSION: 1. Increased size and increased amount of gas within the previously demonstrated large hiatal hernia. This could be an indication of gastric outlet obstruction or ileus. 2. Small bilateral pleural effusions. 3. Continued possible left basilar atelectasis or pneumonia. Electronically Signed   By: Claudie Revering M.D.   On: 05/16/2018 23:14    Procedures Procedures (including critical care time)  Medications Ordered in ED Medications  promethazine (PHENERGAN) injection 6.25 mg (6.25 mg Intravenous Given 05/16/18 2254)  metoCLOPramide (REGLAN) injection 5 mg (5 mg Intravenous Given 05/17/18 0008)  ondansetron (ZOFRAN) injection 4 mg (4 mg Intravenous Given 05/17/18 0008)     Initial Impression / Assessment and Plan / ED Course  I have reviewed the triage vital signs and the nursing notes.  Pertinent labs & imaging results that were available during my care of the patient were reviewed by me  and considered in my medical decision making (see chart for details).     Pt was also seen by Dr. Sedonia Small during todays visit and options given including admission for further diagnostic tests, possible upper endoscopy, NG tube may help alleviate sx.  She and family decided she didn't want any of these tests and wants to be comfort care only.  Offered admission to get hospice arrangements.  Pt states she just wants to go home, does not want admission.    Prior to dc home, pt started vomiting again. She is willing to be admitted comfort care only overnight until this sx can be better controlled. Will call hospitalists for admission.  Discussed with Dr. Darrick Meigs who will see and admit pt.  Final Clinical Impressions(s) / ED Diagnoses   Final diagnoses:  Malignant neoplasm of pancreas, unspecified location of malignancy ([place])  Emesis, persistent    ED Discharge Orders         Ordered    promethazine (PHENERGAN) 12.5 MG tablet  Every 6 hours PRN     05/16/18 2344    HYDROcodone-acetaminophen (NORCO/VICODIN) 5-325 MG tablet  Every 4 hours PRN     05/16/18 2344    HYDROcodone-acetaminophen (NORCO/VICODIN) 5-325 MG tablet  Every 4 hours PRN     05/16/18 2345           Evalee Jefferson, PA-C 05/17/18 0005    Evalee Jefferson, PA-C 05/17/18 0016    Maudie Flakes, MD 05/17/18 0021

## 2018-05-17 ENCOUNTER — Other Ambulatory Visit: Payer: Self-pay

## 2018-05-17 ENCOUNTER — Observation Stay (HOSPITAL_COMMUNITY): Payer: Medicare Other

## 2018-05-17 ENCOUNTER — Encounter (HOSPITAL_COMMUNITY): Payer: Self-pay | Admitting: *Deleted

## 2018-05-17 DIAGNOSIS — R112 Nausea with vomiting, unspecified: Secondary | ICD-10-CM | POA: Diagnosis not present

## 2018-05-17 DIAGNOSIS — R1115 Cyclical vomiting syndrome unrelated to migraine: Secondary | ICD-10-CM

## 2018-05-17 DIAGNOSIS — K311 Adult hypertrophic pyloric stenosis: Secondary | ICD-10-CM | POA: Diagnosis not present

## 2018-05-17 DIAGNOSIS — Z4682 Encounter for fitting and adjustment of non-vascular catheter: Secondary | ICD-10-CM | POA: Diagnosis not present

## 2018-05-17 DIAGNOSIS — C253 Malignant neoplasm of pancreatic duct: Secondary | ICD-10-CM | POA: Diagnosis not present

## 2018-05-17 DIAGNOSIS — K56609 Unspecified intestinal obstruction, unspecified as to partial versus complete obstruction: Secondary | ICD-10-CM | POA: Diagnosis not present

## 2018-05-17 DIAGNOSIS — K449 Diaphragmatic hernia without obstruction or gangrene: Secondary | ICD-10-CM | POA: Diagnosis not present

## 2018-05-17 DIAGNOSIS — C259 Malignant neoplasm of pancreas, unspecified: Secondary | ICD-10-CM | POA: Diagnosis not present

## 2018-05-17 LAB — COMPREHENSIVE METABOLIC PANEL
ALT: 41 U/L (ref 0–44)
AST: 23 U/L (ref 15–41)
Albumin: 3.1 g/dL — ABNORMAL LOW (ref 3.5–5.0)
Alkaline Phosphatase: 590 U/L — ABNORMAL HIGH (ref 38–126)
Anion gap: 10 (ref 5–15)
BILIRUBIN TOTAL: 0.9 mg/dL (ref 0.3–1.2)
BUN: 16 mg/dL (ref 8–23)
CO2: 33 mmol/L — ABNORMAL HIGH (ref 22–32)
Calcium: 8.7 mg/dL — ABNORMAL LOW (ref 8.9–10.3)
Chloride: 98 mmol/L (ref 98–111)
Creatinine, Ser: 1.01 mg/dL — ABNORMAL HIGH (ref 0.44–1.00)
GFR calc Af Amer: 56 mL/min — ABNORMAL LOW (ref >60)
GFR calc non Af Amer: 48 mL/min — ABNORMAL LOW (ref >60)
Glucose, Bld: 170 mg/dL — ABNORMAL HIGH (ref 70–99)
Potassium: 3.7 mmol/L (ref 3.5–5.1)
Sodium: 141 mmol/L (ref 135–145)
TOTAL PROTEIN: 6.6 g/dL (ref 6.5–8.1)

## 2018-05-17 LAB — CBC
HCT: 34.8 % — ABNORMAL LOW (ref 36.0–46.0)
Hemoglobin: 10.8 g/dL — ABNORMAL LOW (ref 12.0–15.0)
MCH: 28.3 pg (ref 26.0–34.0)
MCHC: 31 g/dL (ref 30.0–36.0)
MCV: 91.1 fL (ref 80.0–100.0)
Platelets: 305 10*3/uL (ref 150–400)
RBC: 3.82 MIL/uL — AB (ref 3.87–5.11)
RDW: 14.5 % (ref 11.5–15.5)
WBC: 10.6 10*3/uL — ABNORMAL HIGH (ref 4.0–10.5)
nRBC: 0 % (ref 0.0–0.2)

## 2018-05-17 MED ORDER — SODIUM CHLORIDE 0.9 % IV SOLN: 1.0000 g | INTRAVENOUS | Status: DC

## 2018-05-17 MED ORDER — SODIUM CHLORIDE 0.9 % IV SOLN
INTRAVENOUS | Status: DC
  Administered 2018-05-17 – 2018-05-18 (×3): via INTRAVENOUS

## 2018-05-17 MED ORDER — SODIUM CHLORIDE 0.9 % IV SOLN
2.0000 g | INTRAVENOUS | Status: DC
  Filled 2018-05-17 (×3): qty 20

## 2018-05-17 MED ORDER — LEVOTHYROXINE SODIUM 100 MCG IV SOLR
50.0000 ug | Freq: Every day | INTRAVENOUS | Status: DC
  Administered 2018-05-17 – 2018-05-20 (×4): 50 ug via INTRAVENOUS
  Filled 2018-05-17 (×6): qty 5

## 2018-05-17 MED ORDER — IOPAMIDOL (ISOVUE-300) INJECTION 61%: 75.0000 mL | Freq: Once | INTRAVENOUS | Status: DC | PRN

## 2018-05-17 MED ORDER — PROMETHAZINE HCL 25 MG/ML IJ SOLN
12.5000 mg | Freq: Four times a day (QID) | INTRAMUSCULAR | Status: DC | PRN
  Administered 2018-05-17: 12.5 mg via INTRAVENOUS
  Filled 2018-05-17: qty 1

## 2018-05-17 MED ORDER — METOPROLOL TARTRATE 5 MG/5ML IV SOLN
5.0000 mg | Freq: Four times a day (QID) | INTRAVENOUS | Status: DC | PRN
  Administered 2018-05-17 – 2018-05-18 (×2): 5 mg via INTRAVENOUS
  Filled 2018-05-17: qty 5

## 2018-05-17 MED ORDER — POTASSIUM CHLORIDE 10 MEQ/100ML IV SOLN
10.0000 meq | INTRAVENOUS | Status: AC
  Administered 2018-05-17 (×3): 10 meq via INTRAVENOUS
  Filled 2018-05-17: qty 100

## 2018-05-17 MED ORDER — LIDOCAINE VISCOUS HCL 2 % MT SOLN
OROMUCOSAL | Status: AC
  Filled 2018-05-17: qty 15

## 2018-05-17 MED ORDER — SODIUM CHLORIDE 0.9 % IV SOLN
1.0000 g | INTRAVENOUS | Status: DC
  Administered 2018-05-17 – 2018-05-19 (×3): 1 g via INTRAVENOUS
  Filled 2018-05-17: qty 1
  Filled 2018-05-17: qty 10
  Filled 2018-05-17 (×2): qty 1

## 2018-05-17 MED ORDER — IOHEXOL 300 MG/ML  SOLN
75.0000 mL | Freq: Once | INTRAMUSCULAR | Status: AC | PRN
  Administered 2018-05-17: 75 mL via INTRAVENOUS

## 2018-05-17 NOTE — Progress Notes (Signed)
Palliative Medicine RN Note: Consult order noted. PMT provider will return to [facility] on Monday 12/16. If the patient remains admitted at that time, she will be evaluated then.  Marjie Skiff Macdonald Rigor, RN, BSN, [facility] Palliative Medicine Team 05/17/2018 7:16 AM Office [phone]

## 2018-05-17 NOTE — ED Notes (Addendum)
Pt's son and daughter in law Veda Canning" cell [phone] and [place] cell [phone]

## 2018-05-17 NOTE — H&P (Signed)
TRH H&P    Patient Demographics:    Regina [name], is a 84 y.o. female  MRN: [id number]  DOB - 26-Aug-1922  Admit Date - 05/16/2018  Referring MD/NP/PA: Evalee Jefferson  Outpatient Primary MD for the patient is Sinda Du, MD  Patient coming from: Home  Chief complaint-vomiting   HPI:    Regina [name]  is a 84 y.o. female, with a history of hiatal hernia, hypertension, CVA, recent diagnosis of pancreatic cancer was followed by oncology at [facility], came to hospital with vomiting which started on 6 PM.  Patient was having discussion with oncology regarding starting radiation therapy for pancreatic cancer to slow the progression versus hospice care. Today patient started having vomiting, denies abdominal pain.  No chest pain or shortness of breath. In the ED chest x-ray showed large hiatal hernia which is worse than before and possible gastric outlet obstruction versus ileus. Patient did not improve with Phenergan in the ED NG tube with low intermittent suction has been ordered. No previous history of stroke or seizures. No history of CAD Denies abdominal pain. Denies dysuria    Review of systems:    In addition to the HPI above,    All other systems reviewed and are negative.    Past History of the following :    Past Medical History:  Diagnosis Date  . Anemia   . Arthritis   . Avascular necrosis of bone of left hip ([place])   . Dysrhythmia 4/14   atrial fib post op  . H/O hiatal hernia   . History of blood transfusion   . Hypertension   . Hypothyroidism   . Stroke [facility]) 2003   No deficits      Past Surgical History:  Procedure Laterality Date  . BALLOON DILATION N/A 05/05/2016   Procedure: BALLOON DILATION OF SPHINCTEROTOMY;  Surgeon: Rogene Houston, MD;  [facility];  Service: Endoscopy;  Laterality: N/A;  . ERCP N/A 03/31/2016   Procedure: ENDOSCOPIC RETROGRADE  CHOLANGIOPANCREATOGRAPHY (ERCP);  Surgeon: Rogene Houston, MD;  [facility];  Service: Endoscopy;  Laterality: N/A;  . ERCP N/A 05/05/2016   Procedure: ENDOSCOPIC RETROGRADE CHOLANGIOPANCREATOGRAPHY (ERCP);  Surgeon: Rogene Houston, MD;  [facility];  Service: Endoscopy;  Laterality: N/A;  do NOT move per [facility] [place]  . ERCP N/A 11/11/2016   Procedure: ENDOSCOPIC RETROGRADE CHOLANGIOPANCREATOGRAPHY (ERCP);  Surgeon: Ronnette Juniper, MD;  Location: [facility];  Service: Gastroenterology;  Laterality: N/A;  PRONE POSITION  . ERCP N/A 12/29/2016   Procedure: ENDOSCOPIC RETROGRADE CHOLANGIOPANCREATOGRAPHY (ERCP) WITH STENT EXCHANGE;  Surgeon: Rogene Houston, MD;  [facility];  Service: Endoscopy;  Laterality: N/A;  . ERCP N/A 03/09/2017   Procedure: ENDOSCOPIC RETROGRADE CHOLANGIOPANCREATOGRAPHY (ERCP);  Surgeon: Rogene Houston, MD;  [facility];  Service: Endoscopy;  Laterality: N/A;  . EYE SURGERY Right    cataract removal  . GASTROINTESTINAL STENT REMOVAL N/A 05/05/2016   Procedure: Biliary stent removal.;  Surgeon: Rogene Houston, MD;  [facility];  Service:  Endoscopy;  Laterality: N/A;  . GASTROINTESTINAL STENT REMOVAL N/A 03/09/2017   Procedure: BILARY STENT REMOVAL, BILIARY STENT PLACEMENT;  Surgeon: Rogene Houston, MD;  [facility];  Service: Endoscopy;  Laterality: N/A;  . HIP PINNING,CANNULATED Left 09/13/2012   Procedure: ASNIS HIP PINNING LEFT;  Surgeon: Sanjuana Kava, MD;  Location: [facility];  Service: Orthopedics;  Laterality: Left;  . LITHOTRIPSY N/A 03/31/2016   Procedure: MECHANICAL LITHOTRIPSY;  Surgeon: Rogene Houston, MD;  [facility];  Service: Endoscopy;  Laterality: N/A;  . Mouth Sugery     Upper teeth impacted  . REMOVAL OF STONES N/A 03/31/2016   Procedure: PARTIAL RETRIEVAL OF STONE AND STENTING;  Surgeon: Rogene Houston, MD;  [facility];  Service: Endoscopy;  Laterality: N/A;  .  REMOVAL OF STONES N/A 05/05/2016   Procedure: REMOVAL OF STONES;  Surgeon: Rogene Houston, MD;  [facility];  Service: Endoscopy;  Laterality: N/A;  . SPHINCTEROTOMY N/A 03/31/2016   Procedure: SPHINCTEROTOMY;  Surgeon: Rogene Houston, MD;  [facility];  Service: Endoscopy;  Laterality: N/A;  . SPYGLASS CHOLANGIOSCOPY N/A 05/05/2016   Procedure: IONGEXBM CHOLANGIOSCOPY;  Surgeon: Rogene Houston, MD;  [facility];  Service: Endoscopy;  Laterality: N/A;  . SPYGLASS CHOLANGIOSCOPY N/A 03/09/2017   Procedure: WUXLKGMW CHOLANGIOSCOPY;  Surgeon: Rogene Houston, MD;  [facility];  Service: Endoscopy;  Laterality: N/A;  . TOTAL HIP ARTHROPLASTY Left 10/03/2013   Procedure: LEFT TOTAL HIP ARTHROPLASTY ANTERIOR APPROACH and REMOVAL OF SCREWS LEFT HIP;  Surgeon: Mcarthur Rossetti, MD;  Location: [facility];  Service: Orthopedics;  Laterality: Left;      Social History:      Social History   Tobacco Use  . Smoking status: Never Smoker  . Smokeless tobacco: Never Used  . Tobacco comment: Smoked a few times in college (socially)  Substance Use Topics  . Alcohol use: No    Alcohol/week: 0.0 standard drinks    Comment: None since Christmas 2013       Family History :     Family History  Problem Relation Age of Onset  . Heart attack Mother   . Heart attack Father   . Cancer Brother       Home Medications:   Prior to Admission medications   Medication Sig Start Date End Date Taking? Authorizing Provider  acetaminophen (TYLENOL) 325 MG tablet Take 325 mg by mouth every 6 (six) hours as needed (pain/headache).     [provider]  apixaban (ELIQUIS) 2.5 MG TABS tablet Take 1 tablet (2.5 mg total) by mouth 2 (two) times daily. 03/11/17   Rehman, Mechele Dawley, MD  clotrimazole-betamethasone (LOTRISONE) cream Apply 1 application topically 2 (two) times daily as needed (for rash.).    [provider]  diltiazem (CARDIZEM CD) 120 MG  24 hr capsule TAKE 1 CAPSULE BY MOUTH ONCE DAILY 11/19/17   Herminio Commons, MD  HYDROcodone-acetaminophen (NORCO/VICODIN) 5-325 MG tablet Take 1 tablet by mouth every 4 (four) hours as needed. 05/16/18   Evalee Jefferson, PA-C  HYDROcodone-acetaminophen (NORCO/VICODIN) 5-325 MG tablet Take 1 tablet by mouth every 4 (four) hours as needed for moderate pain. 05/16/18   Evalee Jefferson, PA-C  levothyroxine (SYNTHROID, LEVOTHROID) 125 MCG tablet TK 1 T PO QD 01/12/18   [provider]  lipase/protease/amylase (CREON) 12000 units CPEP capsule Take 2 capsules (24,000 Units total) by mouth 3 (three) times daily with meals. 1  capsule with each snack.  Can have 2 snacks per day. 03/07/18   Rogene Houston, MD  promethazine (PHENERGAN) 12.5 MG tablet Take 1 tablet (12.5 mg total) by mouth every 6 (six) hours as needed for nausea or vomiting. 05/16/18   Evalee Jefferson, PA-C     Allergies:    No Known Allergies   Physical Exam:   Vitals  Blood pressure (!) 164/103, pulse (!) 113, temperature 98.6 F (37 C), temperature source Oral, resp. rate (!) 28, height 5\' 4"  (1.626 m), weight 40.1 kg, SpO2 95 %.  1.  General: Appears in mild distress  2. Psychiatric:  Intact judgement and  insight, awake alert, oriented x 3.  3. Neurologic: No focal neurological deficits, all cranial nerves intact.Strength 5/5 all 4 extremities, sensation intact all 4 extremities, plantars down going.  4. Eyes :  anicteric sclerae, moist conjunctivae with no lid lag. PERRLA.  5. ENMT:  Oropharynx clear with moist mucous membranes and good dentition  6. Neck:  supple, no cervical lymphadenopathy appriciated, No thyromegaly  7. Respiratory : Clear to auscultation bilaterally  8. Cardiovascular : RRR, no gallops, rubs or murmurs, no leg edema  9. Gastrointestinal:  Abdomen soft, nontender, no organomegaly  10. Skin:  No cyanosis, normal texture and turgor, no rash, lesions or ulcers  11.Musculoskeletal:  Good  muscle tone,  joints appear normal , no effusions,  normal range of motion    Data Review:    CBC Recent Labs  Lab 05/16/18 2248  WBC 13.1*  HGB 11.5*  HCT 36.0  PLT 322  MCV 91.8  MCH 29.3  MCHC 31.9  RDW 14.4  LYMPHSABS 1.0  MONOABS 0.7  EOSABS 0.0  BASOSABS 0.0   ------------------------------------------------------------------------------------------------------------------  Results for orders placed or performed during the hospital encounter of 05/16/18 (from the past 48 hour(s))  CBC with Differential     Status: Abnormal   Collection Time: 05/16/18 10:48 PM  Result Value Ref Range   WBC 13.1 (H) 4.0 - 10.5 K/uL   RBC 3.92 3.87 - 5.11 MIL/uL   Hemoglobin 11.5 (L) 12.0 - 15.0 g/dL   HCT 36.0 36.0 - 46.0 %   MCV 91.8 80.0 - 100.0 fL   MCH 29.3 26.0 - 34.0 pg   MCHC 31.9 30.0 - 36.0 g/dL   RDW 14.4 11.5 - 15.5 %   Platelets 322 150 - 400 K/uL   nRBC 0.0 0.0 - 0.2 %   Neutrophils Relative % 87 %   Neutro Abs 11.4 (H) 1.7 - 7.7 K/uL   Lymphocytes Relative 7 %   Lymphs Abs 1.0 0.7 - 4.0 K/uL   Monocytes Relative 5 %   Monocytes Absolute 0.7 0.1 - 1.0 K/uL   Eosinophils Relative 0 %   Eosinophils Absolute 0.0 0.0 - 0.5 K/uL   Basophils Relative 0 %   Basophils Absolute 0.0 0.0 - 0.1 K/uL   Immature Granulocytes 1 %   Abs Immature Granulocytes 0.07 0.00 - 0.07 K/uL    Comment: Performed at [facility], [address] [place], [place] [address]  Comprehensive metabolic panel     Status: Abnormal   Collection Time: 05/16/18 10:48 PM  Result Value Ref Range   Sodium 136 135 - 145 mmol/L   Potassium 3.1 (L) 3.5 - 5.1 mmol/L   Chloride 95 (L) 98 - 111 mmol/L   CO2 30 22 - 32 mmol/L   Glucose, Bld 174 (H) 70 - 99 mg/dL   BUN 15 8 -  23 mg/dL   Creatinine, Ser 1.00 0.44 - 1.00 mg/dL   Calcium 8.8 (L) 8.9 - 10.3 mg/dL   Total Protein 7.6 6.5 - 8.1 g/dL   Albumin 3.5 3.5 - 5.0 g/dL   AST 27 15 - 41 U/L   ALT 50 (H) 0 - 44 U/L   Alkaline Phosphatase 687 (H) 38 -  126 U/L   Total Bilirubin 0.9 0.3 - 1.2 mg/dL   GFR calc non Af Amer 49 (L) >60 mL/min   GFR calc Af Amer 56 (L) >60 mL/min   Anion gap 11 5 - 15    Comment: Performed at [facility], [address], [place], [place] [address]  Lipase, blood     Status: None   Collection Time: 05/16/18 10:48 PM  Result Value Ref Range   Lipase 18 11 - 51 U/L    Comment: Performed at [facility], [address] [place], [place] [address]  Type and screen     Status: None   Collection Time: 05/16/18 10:48 PM  Result Value Ref Range   ABO/RH(D) A POS    Antibody Screen NEG    Sample Expiration      05/19/2018 Performed at [facility], [address], [place], [place] [address]     Chemistries  Recent Labs  Lab 05/16/18 2248  NA 136  K 3.1*  CL 95*  CO2 30  GLUCOSE 174*  BUN 15  CREATININE 1.00  CALCIUM 8.8*  AST 27  ALT 50*  ALKPHOS 687*  BILITOT 0.9   ------------------------------------------------------------------------------------------------------------------  ------------------------------------------------------------------------------------------------------------------ GFR: Estimated Creatinine Clearance: 22.2 mL/min (by C-G formula based on SCr of 1 mg/dL). Liver Function Tests: Recent Labs  Lab 05/16/18 2248  AST 27  ALT 50*  ALKPHOS 687*  BILITOT 0.9  PROT 7.6  ALBUMIN 3.5   Recent Labs  Lab 05/16/18 2248  LIPASE 18     --------------------------------------------------------------------------------------------------------------- Urine analysis:    Component Value Date/Time   COLORURINE AMBER (A) 12/29/2016 1526   APPEARANCEUR HAZY (A) 12/29/2016 1526   LABSPEC 1.021 12/29/2016 1526   PHURINE 5.0 12/29/2016 1526   GLUCOSEU NEGATIVE 12/29/2016 1526   HGBUR SMALL (A) 12/29/2016 1526   BILIRUBINUR MODERATE (A) 12/29/2016 1526   KETONESUR NEGATIVE 12/29/2016 1526   PROTEINUR 100 (A) 12/29/2016 1526   UROBILINOGEN 0.2 09/25/2013 1402   NITRITE NEGATIVE  12/29/2016 1526   LEUKOCYTESUR NEGATIVE 12/29/2016 1526      Imaging Results:    Dg Chest Portable 1 View  Result Date: 05/16/2018 CLINICAL DATA:  Vomiting. EXAM: PORTABLE CHEST 1 VIEW COMPARISON:  12/29/2016. FINDINGS: Increased gas within the previously demonstrated large hiatal hernia with an increase in size of the hernia. Stable enlarged cardiac silhouette and ill-defined opacity at the left lung base. Clear right lung. Small bilateral pleural effusions. Old, healed left humeral neck fracture. Diffuse osteopenia. IMPRESSION: 1. Increased size and increased amount of gas within the previously demonstrated large hiatal hernia. This could be an indication of gastric outlet obstruction or ileus. 2. Small bilateral pleural effusions. 3. Continued possible left basilar atelectasis or pneumonia. Electronically Signed   By: Claudie Revering M.D.   On: 05/16/2018 23:14    My personal review of EKG: Rhythm atrial fibrillation   Assessment & Plan:    Active Problems:   Gastric outlet obstruction   1. Vomiting-patient has large hiatal hernia, with x-ray showing increase amount of gas which could be indication for gastric outlet obstruction or ileus.  NG tube has been  ordered.  Continue Phenergan as needed for nausea/vomiting.  Patient was seen by GI in the past, will consult GI in a.m.  2. Pancreatic cancer-patient is considering comfort measures only and hospice option.  Will consult palliative care for goals of care discussion  3. Hypothyroidism-we will start IV Synthroid 50 mcg daily  4. Atrial fibrillation-patient is on Eliquis and Cardizem.  Heart rate is controlled.  Will hold both medications.  Start PRN metoprolol 5 mg IV every 6 hours for heart rate greater than 100  5. Hypokalemia-potassium was 3.1, will replace potassium and check BMP in a.m.    DVT Prophylaxis-patient on Eliquis at home, currently on hold  AM Labs Ordered, also please review Full Orders  Family Communication:  Admission, patients condition and plan of care including tests being ordered have been discussed with the patient and her daughter in law and son at bedside who indicate understanding and agree with the plan and Code Status.  Code Status: DNR  Admission status: Observation: Based on patients clinical presentation and evaluation of above clinical data, I have made determination that patient will need less than 2 midnights.  Time spent in minutes : 60 minutes   Oswald Hillock M.D on 05/17/2018 at 1:20 AM  Between 7am to 7pm - Pager - [phone]. After 7pm go to [email or web address] - password [email or web address]   [facility] - Office  [phone]

## 2018-05-17 NOTE — Progress Notes (Signed)
Subjective: She says she feels okay.  She had nasogastric tube placed and she had a large volume of secretions removed.  She feels much better.  She wants to see if she can have the nasogastric tube out.  No longer nauseated.  She does not complain of pain.  Objective: Vital signs in last 24 hours: Temp:  [97.5 F (36.4 C)-98.6 F (37 C)] 97.8 F (36.6 C) (12/13 0609) Pulse Rate:  [53-113] 53 (12/13 0609) Resp:  [20-29] 24 (12/13 0609) BP: (144-187)/(80-103) 167/80 (12/13 0609) SpO2:  [95 %-99 %] 99 % (12/13 0609) Weight:  [38.7 kg-40.1 kg] 38.7 kg (12/13 0201) Weight change:     Intake/Output from previous day: 12/12 0701 - 12/13 0700 In: 500 [IV Piggyback:500] Out: -   PHYSICAL EXAM General appearance: alert, cooperative and no distress Resp: clear to auscultation bilaterally Cardio: regular rate and rhythm, S1, S2 normal, no murmur, click, rub or gallop GI: Her abdomen is soft now.  She has nasogastric tube in place with brown material draining Extremities: extremities normal, atraumatic, no cyanosis or edema  Lab Results:  Results for orders placed or performed during the hospital encounter of 05/16/18 (from the past 48 hour(s))  CBC with Differential     Status: Abnormal   Collection Time: 05/16/18 10:48 PM  Result Value Ref Range   WBC 13.1 (H) 4.0 - 10.5 K/uL   RBC 3.92 3.87 - 5.11 MIL/uL   Hemoglobin 11.5 (L) 12.0 - 15.0 g/dL   HCT 36.0 36.0 - 46.0 %   MCV 91.8 80.0 - 100.0 fL   MCH 29.3 26.0 - 34.0 pg   MCHC 31.9 30.0 - 36.0 g/dL   RDW 14.4 11.5 - 15.5 %   Platelets 322 150 - 400 K/uL   nRBC 0.0 0.0 - 0.2 %   Neutrophils Relative % 87 %   Neutro Abs 11.4 (H) 1.7 - 7.7 K/uL   Lymphocytes Relative 7 %   Lymphs Abs 1.0 0.7 - 4.0 K/uL   Monocytes Relative 5 %   Monocytes Absolute 0.7 0.1 - 1.0 K/uL   Eosinophils Relative 0 %   Eosinophils Absolute 0.0 0.0 - 0.5 K/uL   Basophils Relative 0 %   Basophils Absolute 0.0 0.0 - 0.1 K/uL   Immature Granulocytes 1 %    Abs Immature Granulocytes 0.07 0.00 - 0.07 K/uL    Comment: Performed at [facility], [address], [place], [place] [address]  Comprehensive metabolic panel     Status: Abnormal   Collection Time: 05/16/18 10:48 PM  Result Value Ref Range   Sodium 136 135 - 145 mmol/L   Potassium 3.1 (L) 3.5 - 5.1 mmol/L   Chloride 95 (L) 98 - 111 mmol/L   CO2 30 22 - 32 mmol/L   Glucose, Bld 174 (H) 70 - 99 mg/dL   BUN 15 8 - 23 mg/dL   Creatinine, Ser 1.00 0.44 - 1.00 mg/dL   Calcium 8.8 (L) 8.9 - 10.3 mg/dL   Total Protein 7.6 6.5 - 8.1 g/dL   Albumin 3.5 3.5 - 5.0 g/dL   AST 27 15 - 41 U/L   ALT 50 (H) 0 - 44 U/L   Alkaline Phosphatase 687 (H) 38 - 126 U/L   Total Bilirubin 0.9 0.3 - 1.2 mg/dL   GFR calc non Af Amer 49 (L) >60 mL/min   GFR calc Af Amer 56 (L) >60 mL/min   Anion gap 11 5 - 15    Comment: Performed at [facility], [address] [place], [place] [address]  Lipase, blood     Status: None   Collection Time: 05/16/18 10:48 PM  Result Value Ref Range   Lipase 18 11 - 51 U/L    Comment: Performed at [facility], [address] [place], [place] [address]  Type and screen     Status: None   Collection Time: 05/16/18 10:48 PM  Result Value Ref Range   ABO/RH(D) A POS    Antibody Screen NEG    Sample Expiration      05/19/2018 Performed at [facility], [address], [place], [place] [address]   CBC     Status: Abnormal   Collection Time: 05/17/18  4:38 AM  Result Value Ref Range   WBC 10.6 (H) 4.0 - 10.5 K/uL   RBC 3.82 (L) 3.87 - 5.11 MIL/uL   Hemoglobin 10.8 (L) 12.0 - 15.0 g/dL   HCT 34.8 (L) 36.0 - 46.0 %   MCV 91.1 80.0 - 100.0 fL   MCH 28.3 26.0 - 34.0 pg   MCHC 31.0 30.0 - 36.0 g/dL   RDW 14.5 11.5 - 15.5 %   Platelets 305 150 - 400 K/uL   nRBC 0.0 0.0 - 0.2 %    Comment: Performed at [facility], [address] [place], [place] [address]  Comprehensive metabolic panel     Status: Abnormal   Collection Time: 05/17/18  4:38 AM  Result Value Ref Range    Sodium 141 135 - 145 mmol/L   Potassium 3.7 3.5 - 5.1 mmol/L   Chloride 98 98 - 111 mmol/L   CO2 33 (H) 22 - 32 mmol/L   Glucose, Bld 170 (H) 70 - 99 mg/dL   BUN 16 8 - 23 mg/dL   Creatinine, Ser 1.01 (H) 0.44 - 1.00 mg/dL   Calcium 8.7 (L) 8.9 - 10.3 mg/dL   Total Protein 6.6 6.5 - 8.1 g/dL   Albumin 3.1 (L) 3.5 - 5.0 g/dL   AST 23 15 - 41 U/L   ALT 41 0 - 44 U/L   Alkaline Phosphatase 590 (H) 38 - 126 U/L   Total Bilirubin 0.9 0.3 - 1.2 mg/dL   GFR calc non Af Amer 48 (L) >60 mL/min   GFR calc Af Amer 56 (L) >60 mL/min   Anion gap 10 5 - 15    Comment: Performed at [facility], [address] [place], [place] [address]    ABGS No results for input(s): PHART, PO2ART, TCO2, HCO3 in the last 72 hours.  Invalid input(s): PCO2 CULTURES No results found for this or any previous visit (from the past 240 hour(s)). Studies/Results: Dg Chest Portable 1 View  Result Date: 05/17/2018 CLINICAL DATA:  84 year old female with NG tube placement. EXAM: PORTABLE CHEST 1 VIEW COMPARISON:  Earlier radiograph dated 05/16/2018 FINDINGS: Interval placement of an enteric tube. The side port of the tube is over the mediastinum, above the diaphragm, likely at the level of the gastroesophageal junction in the hiatal hernia and the tip is over the gastric air at the level of the right hemidiaphragm likely within the stomach in the hiatal hernia. Recommend further advancing of the tube by approximately 3-4 cm. Overall slight interval decrease in the gastric air within the hernia compared to the earlier radiograph. No other interval change since the prior radiograph. IMPRESSION: Interval placement of an enteric tube with side port likely at the level of the GE junction and tip in the stomach within the hiatal  hernia. Recommend further advancing the tube by approximately 3-4 cm. Electronically Signed   By: Anner Crete M.D.   On: 05/17/2018 01:28   Dg Chest Portable 1 View  Result Date:  05/16/2018 CLINICAL DATA:  Vomiting. EXAM: PORTABLE CHEST 1 VIEW COMPARISON:  12/29/2016. FINDINGS: Increased gas within the previously demonstrated large hiatal hernia with an increase in size of the hernia. Stable enlarged cardiac silhouette and ill-defined opacity at the left lung base. Clear right lung. Small bilateral pleural effusions. Old, healed left humeral neck fracture. Diffuse osteopenia. IMPRESSION: 1. Increased size and increased amount of gas within the previously demonstrated large hiatal hernia. This could be an indication of gastric outlet obstruction or ileus. 2. Small bilateral pleural effusions. 3. Continued possible left basilar atelectasis or pneumonia. Electronically Signed   By: Claudie Revering M.D.   On: 05/16/2018 23:14    Medications:  Prior to Admission:  Medications Prior to Admission  Medication Sig Dispense Refill Last Dose  . acetaminophen (TYLENOL) 325 MG tablet Take 325 mg by mouth every 6 (six) hours as needed (pain/headache).    Taking  . apixaban (ELIQUIS) 2.5 MG TABS tablet Take 1 tablet (2.5 mg total) by mouth 2 (two) times daily. 60 tablet 6 Taking  . clotrimazole-betamethasone (LOTRISONE) cream Apply 1 application topically 2 (two) times daily as needed (for rash.).   Taking  . diltiazem (CARDIZEM CD) 120 MG 24 hr capsule TAKE 1 CAPSULE BY MOUTH ONCE DAILY 90 capsule 3 Taking  . levothyroxine (SYNTHROID, LEVOTHROID) 125 MCG tablet TK 1 T PO QD  12   . lipase/protease/amylase (CREON) 12000 units CPEP capsule Take 2 capsules (24,000 Units total) by mouth 3 (three) times daily with meals. 1 capsule with each snack.  Can have 2 snacks per day. 240 capsule 5    Scheduled: . levothyroxine  50 mcg Intravenous Daily  . lidocaine       Continuous: . sodium chloride 100 mL/hr at 05/17/18 0518   PPI:RJJOACZYSA tartrate, promethazine  Assesment: She was admitted with what may be gastric outlet obstruction.  She has responded well to nasogastric tube  placement.  She was hypokalemic and that is better  She had intractable nausea and vomiting and that is better  Is a relatively new diagnosis of pancreatic cancer and is trying to decide what to do about that whether to pursue treatment or have a more palliative approach.  Palliative medicine consultation has been requested but is not available today Active Problems:   Gastric outlet obstruction    Plan: Clamp nasogastric tube.  Recheck residual in about 3 hours.  GI consult    LOS: 0 days   Lily Kernen L 05/17/2018, 9:00 AM

## 2018-05-17 NOTE — Progress Notes (Signed)
Patient may have clear liquids, clamp NG tube and reconnect to low intermittent suction if unable to tolerate per Dr. Laural Golden. Stated he will call her family with CT results/update. Notified patient and family at bedside. Donavan Foil, RN

## 2018-05-17 NOTE — Progress Notes (Signed)
Patient tachy with HR in the 130s. Patient was assisted up to bathroom, denied dizziness, palpitations, or other discomfort. Vitals stable otherwise, see flowsheets. Metoprolol 5 mg IV given as ordered for HR >100. Discussed with patient and family at bedside. Verbalized understanding. HR returned to 90s after metoprolol. Assisted back to bed, bedalarm on for safety. Call light within reach and instructed to call for assistance and not attempt getting up on her own. Verbalized understanding. Attempted to notify oncall MD of HR, awaiting return call. Donavan Foil, RN

## 2018-05-17 NOTE — Consult Note (Signed)
Referring Provider: Jasper Loser. Regina Pulling, MD Primary Care Physician:  Sinda Du, MD Primary Gastroenterologist:  Dr. Laural Golden  Reason for Consultation:    Nausea and vomiting in a patient with known pancreatic carcinoma and biliary stent in place.  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  NG tube was placed while she was in the emergency room.  She had over 3 L of return within the first few minutes.  Therefore gastric outlet obstruction was suspected and patient was admitted to Dr. Luan [name] service for further evaluation and management.  She has had small amount of blood/coffee-ground material via NG tube earlier today.  Tube has now been clamped. History is provided primarily by patient's son Regina [name] and her daughter-in-law Regina [name]. She was doing well yesterday morning.  She had good breakfast and small lunch. She got sick in the evening with nausea and vomiting.  She vomited food and fluid but no blood.  She did not experience fever or chills.  Patient states she had a bowel movement yesterday and was brown in color.  She remains with intermittent diarrhea.  She has exocrine pancreatic insufficiency and has been on pancreatic enzyme supplements.  She has not lost any weight within the last 1 months. Review of the systems is negative for fever or chills.  She also denies chest pain or shortness of breath. Patient and her family have reviewed treatment options for pancreatic carcinoma and decided on supportive measures. Patient lives by herself but her son and daughter-in-law live next door and they are ending out multiple times a day.   Past Medical History:  Diagnosis Date  . Anemia   . Arthritis   . Avascular necrosis of bone  of left hip ([place])   . Dysrhythmia 4/14   atrial fib post op  . H/O hiatal hernia   . History of blood transfusion   . Hypertension   . Hypothyroidism   . Stroke [facility]) 2003   No deficits       Pancreatic carcinoma.      History of bile duct stricture secondary to pancreatic carcinoma status post wall stenting in October 2018.  Past Surgical History:  Procedure Laterality Date  . BALLOON DILATION N/A 05/05/2016   Procedure: BALLOON DILATION OF SPHINCTEROTOMY;  Surgeon: Rogene Houston, MD;  [facility];  Service: Endoscopy;  Laterality: N/A;  . ERCP N/A 03/31/2016   Procedure: ENDOSCOPIC RETROGRADE CHOLANGIOPANCREATOGRAPHY (ERCP);  Surgeon: Rogene Houston, MD;  [facility];  Service: Endoscopy;  Laterality: N/A;  . ERCP N/A 05/05/2016   Procedure: ENDOSCOPIC RETROGRADE CHOLANGIOPANCREATOGRAPHY (ERCP);  Surgeon: Rogene Houston, MD;  [facility];  Service: Endoscopy;  Laterality: N/A;  do NOT move per [facility]  . ERCP N/A 11/11/2016   Procedure: ENDOSCOPIC RETROGRADE CHOLANGIOPANCREATOGRAPHY (ERCP);  Surgeon: Ronnette Juniper, MD;  Location: [place];  Service: Gastroenterology;  Laterality: N/A;  PRONE POSITION  . ERCP N/A 12/29/2016   Procedure: ENDOSCOPIC RETROGRADE CHOLANGIOPANCREATOGRAPHY (ERCP) WITH STENT EXCHANGE;  Surgeon: Rogene Houston, MD;  [facility];  Service: Endoscopy;  Laterality: N/A;  . ERCP N/A 03/09/2017   Procedure: ENDOSCOPIC RETROGRADE CHOLANGIOPANCREATOGRAPHY (ERCP);  Surgeon: Rogene Houston, MD;  [facility];  Service: Endoscopy;  Laterality: N/A;  .  EYE SURGERY Right    cataract removal  . GASTROINTESTINAL STENT REMOVAL N/A 05/05/2016   Procedure: Biliary stent removal.;  Surgeon: Rogene Houston, MD;  [facility];  Service: Endoscopy;  Laterality: N/A;  . GASTROINTESTINAL STENT REMOVAL N/A 03/09/2017   Procedure: BILARY STENT REMOVAL, BILIARY STENT PLACEMENT;  Surgeon: Rogene Houston, MD;   [facility];  Service: Endoscopy;  Laterality: N/A;  . HIP PINNING,CANNULATED Left 09/13/2012   Procedure: ASNIS HIP PINNING LEFT;  Surgeon: Sanjuana Kava, MD;  [facility];  Service: Orthopedics;  Laterality: Left;  . LITHOTRIPSY N/A 03/31/2016   Procedure: MECHANICAL LITHOTRIPSY;  Surgeon: Rogene Houston, MD;  [facility];  Service: Endoscopy;  Laterality: N/A;  . Mouth Sugery     Upper teeth impacted  . REMOVAL OF STONES N/A 03/31/2016   Procedure: PARTIAL RETRIEVAL OF STONE AND STENTING;  Surgeon: Rogene Houston, MD;  [facility];  Service: Endoscopy;  Laterality: N/A;  . REMOVAL OF STONES N/A 05/05/2016   Procedure: REMOVAL OF STONES;  Surgeon: Rogene Houston, MD;  [facility];  Service: Endoscopy;  Laterality: N/A;  . SPHINCTEROTOMY N/A 03/31/2016   Procedure: SPHINCTEROTOMY;  Surgeon: Rogene Houston, MD;  [facility];  Service: Endoscopy;  Laterality: N/A;  . SPYGLASS CHOLANGIOSCOPY N/A 05/05/2016   Procedure: QIHKVQQV CHOLANGIOSCOPY;  Surgeon: Rogene Houston, MD;  [facility];  Service: Endoscopy;  Laterality: N/A;  . SPYGLASS CHOLANGIOSCOPY N/A 03/09/2017   Procedure: ZDGLOVFI CHOLANGIOSCOPY;  Surgeon: Rogene Houston, MD;  [facility];  Service: Endoscopy;  Laterality: N/A;  . TOTAL HIP ARTHROPLASTY Left 10/03/2013   Procedure: LEFT TOTAL HIP ARTHROPLASTY ANTERIOR APPROACH and REMOVAL OF SCREWS LEFT HIP;  Surgeon: Mcarthur Rossetti, MD;  Location: [facility];  Service: Orthopedics;  Laterality: Left;    Prior to Admission medications   Medication Sig Start Date End Date Taking? Authorizing Provider  acetaminophen (TYLENOL) 325 MG tablet Take 325 mg by mouth every 6 (six) hours as needed (pain/headache).     [provider]  apixaban (ELIQUIS) 2.5 MG TABS tablet Take 1 tablet (2.5 mg total) by mouth 2 (two) times daily. 03/11/17   , Mechele Dawley, MD  clotrimazole-betamethasone  (LOTRISONE) cream Apply 1 application topically 2 (two) times daily as needed (for rash.).    [provider]  diltiazem (CARDIZEM CD) 120 MG 24 hr capsule TAKE 1 CAPSULE BY MOUTH ONCE DAILY 11/19/17   Herminio Commons, MD  HYDROcodone-acetaminophen (NORCO/VICODIN) 5-325 MG tablet Take 1 tablet by mouth every 4 (four) hours as needed. 05/16/18   Evalee Jefferson, PA-C  HYDROcodone-acetaminophen (NORCO/VICODIN) 5-325 MG tablet Take 1 tablet by mouth every 4 (four) hours as needed for moderate pain. 05/16/18   Evalee Jefferson, PA-C  levothyroxine (SYNTHROID, LEVOTHROID) 125 MCG tablet TK 1 T PO QD 01/12/18   [provider]  lipase/protease/amylase (CREON) 12000 units CPEP capsule Take 2 capsules (24,000 Units total) by mouth 3 (three) times daily with meals. 1 capsule with each snack.  Can have 2 snacks per day. 03/07/18   Rogene Houston, MD  promethazine (PHENERGAN) 12.5 MG tablet Take 1 tablet (12.5 mg total) by mouth every 6 (six) hours as needed for nausea or vomiting. 05/16/18   Evalee Jefferson, PA-C    Current Facility-Administered Medications  Medication Dose Route Frequency Provider Last Rate Last Dose  . 0.9 %  sodium chloride infusion   Intravenous  Continuous Oswald Hillock, MD 100 mL/hr at 05/17/18 0518    . cefTRIAXone (ROCEPHIN) 1 g in sodium chloride 0.9 % 100 mL IVPB  1 g Intravenous Q24H ,  U, MD      . levothyroxine (SYNTHROID, LEVOTHROID) injection 50 mcg  50 mcg Intravenous Daily Oswald Hillock, MD   50 mcg at 05/17/18 1122  . metoprolol tartrate (LOPRESSOR) injection 5 mg  5 mg Intravenous Q6H PRN Oswald Hillock, MD      . promethazine (PHENERGAN) injection 12.5 mg  12.5 mg Intravenous Q6H PRN Oswald Hillock, MD   12.5 mg at 05/17/18 1120    Allergies as of 05/16/2018  . (No Known Allergies)    Family History  Problem Relation Age of Onset  . Heart attack Mother   . Heart attack Father   . Cancer Brother     Social History   Socioeconomic History  .  Marital status: Widowed    Spouse name: Not on file  . Number of children: Not on file  . Years of education: Not on file  . Highest education level: Not on file  Occupational History  . Not on file  Social Needs  . Financial resource strain: Not hard at all  . Food insecurity:    Worry: Never true    Inability: Never true  . Transportation needs:    Medical: Yes    Non-medical: Yes  Tobacco Use  . Smoking status: Never Smoker  . Smokeless tobacco: Never Used  . Tobacco comment: Smoked a few times in college (socially)  Substance and Sexual Activity  . Alcohol use: No    Alcohol/week: 0.0 standard drinks    Comment: None since Christmas 2013  . Drug use: No  . Sexual activity: Never    Birth control/protection: None  Lifestyle  . Physical activity:    Days per week: 0 days    Minutes per session: 0 min  . Stress: Only a little  Relationships  . Social connections:    Talks on phone: More than three times a week    Gets together: More than three times a week    Attends religious service: Never    Active member of club or organization: Yes    Attends meetings of clubs or organizations: Never    Relationship status: Widowed  . Intimate partner violence:    Fear of current or ex partner: Patient refused    Emotionally abused: Patient refused    Physically abused: Patient refused    Forced sexual activity: Patient refused  Other Topics Concern  . Not on file  Social History Narrative  . Not on file    Review of Systems: See HPI, otherwise normal ROS  Physical Exam: Temp:  [97.5 F (36.4 C)-98.6 F (37 C)] 97.8 F (36.6 C) (12/13 0609) Pulse Rate:  [53-113] 53 (12/13 0609) Resp:  [20-29] 24 (12/13 0609) BP: (144-187)/(80-103) 167/80 (12/13 0609) SpO2:  [95 %-99 %] 99 % (12/13 0609) Weight:  [38.7 kg-40.1 kg] 38.7 kg (12/13 0201) Last BM Date: 05/15/18  Well-developed thin Caucasian female who is alert and does not appear to be in any distress. NG tube is in  place but it has been clamped. Conjunctiva is pink.  Sclera is nonicteric. Oropharyngeal mucosa is normal. No neck masses or thyromegaly noted. Cardiac exam with regular rhythm normal S1 and S2.  She has grade 2/6 systolic ejection murmur at left sternal border and aortic area. Auscultation of lungs  reveal vesicular breath sounds bilaterally.  No rales or rhonchi noted. Abdomen is scaphoid.  Bowel sounds are normal.  On palpation it is soft and nontender without hepatosplenomegaly. No peripheral edema or clubbing noted.   Lab Results: Recent Labs    05/16/18 2248 05/17/18 0438  WBC 13.1* 10.6*  HGB 11.5* 10.8*  HCT 36.0 34.8*  PLT 322 305   BMET Recent Labs    05/16/18 2248 05/17/18 0438  NA 136 141  K 3.1* 3.7  CL 95* 98  CO2 30 33*  GLUCOSE 174* 170*  BUN 15 16  CREATININE 1.00 1.01*  CALCIUM 8.8* 8.7*   LFT Recent Labs    05/17/18 0438  PROT 6.6  ALBUMIN 3.1*  AST 23  ALT 41  ALKPHOS 590*  BILITOT 0.9   PT/INR No results for input(s): LABPROT, INR in the last 72 hours. Hepatitis Panel No results for input(s): HEPBSAG, HCVAB, HEPAIGM, HEPBIGM in the last 72 hours.  Studies/Results: Dg Chest Portable 1 View  Result Date: 05/17/2018 CLINICAL DATA:  84 year old female with NG tube placement. EXAM: PORTABLE CHEST 1 VIEW COMPARISON:  Earlier radiograph dated 05/16/2018 FINDINGS: Interval placement of an enteric tube. The side port of the tube is over the mediastinum, above the diaphragm, likely at the level of the gastroesophageal junction in the hiatal hernia and the tip is over the gastric air at the level of the right hemidiaphragm likely within the stomach in the hiatal hernia. Recommend further advancing of the tube by approximately 3-4 cm. Overall slight interval decrease in the gastric air within the hernia compared to the earlier radiograph. No other interval change since the prior radiograph. IMPRESSION: Interval placement of an enteric tube with side port  likely at the level of the GE junction and tip in the stomach within the hiatal hernia. Recommend further advancing the tube by approximately 3-4 cm. Electronically Signed   By: Anner Crete M.D.   On: 05/17/2018 01:28   Dg Chest Portable 1 View  Result Date: 05/16/2018 CLINICAL DATA:  Vomiting. EXAM: PORTABLE CHEST 1 VIEW COMPARISON:  12/29/2016. FINDINGS: Increased gas within the previously demonstrated large hiatal hernia with an increase in size of the hernia. Stable enlarged cardiac silhouette and ill-defined opacity at the left lung base. Clear right lung. Small bilateral pleural effusions. Old, healed left humeral neck fracture. Diffuse osteopenia. IMPRESSION: 1. Increased size and increased amount of gas within the previously demonstrated large hiatal hernia. This could be an indication of gastric outlet obstruction or ileus. 2. Small bilateral pleural effusions. 3. Continued possible left basilar atelectasis or pneumonia. Electronically Signed   By: Claudie Revering M.D.   On: 05/16/2018 23:14   Chest and abdominal films reviewed.  Assessment;  Patient is 84 year old Caucasian female who has a history of malignant biliary stricture treated with wall stenting in October last year who was subsequently diagnosed to have pancreatic carcinoma who was felt not to be a candidate for therapy who now presents with acute onset of nausea and vomiting.  Upper GI tract was decompressed with NG tube with return of over 3 L of fluid.  Since then she has had scant amount of blood.  She has noted more infrascapular and interscapular pain since yesterday but she does not have abdominal pain.  Lab studies are pertinent for significant bump in her alkaline phosphatase but bilirubin is normal. Given her presentation she could have duodenal obstruction secondary to pancreatic carcinoma.  She could also have incomplete biliary obstruction with cholangitis  given significant bump in her alkaline phosphatase.  Doubt that  she has peptic ulcer disease.  She had sanguinous NG return but it does not appear to be significant.  It may be related to NG tube trauma or heaving in the setting of anticoagulation.  Apixaban is on hold.   Recommendations;  Begin ceftriaxone 1 g IV every 24 hours. Proceed with abdominal pelvic CT with IV contrast only. Further recommendations to be made after CT completed and reviewed.      LOS: 0 days      05/17/2018, 3:24 PM

## 2018-05-17 NOTE — Progress Notes (Signed)
Initial Nutrition Assessment  DOCUMENTATION CODES:   Severe malnutrition in context of chronic illness, Underweight  INTERVENTION:  Patient is NPO-NGT placed.  RD will f/u Monday-results of palliative medicine discussion with pt and family-GOC   NUTRITION DIAGNOSIS:   Severe Malnutrition related to chronic illness(pancreatic cancer- takes enzymes daily with meals. Acute vomiting and currently NPO with NGT) as evidenced by NPO status, per patient/family report, percent weight loss (>20% in 1 year), energy intake < or equal to 75% for > or equal to 1 month, moderate muscle depletion.   GOAL:  (Follow for patient goals once Palliavitve medicing has had the opportunity to discuss)   MONITOR:   Diet advancement, Weight trends, Labs   REASON FOR ASSESSMENT:   Malnutrition Screening Tool    ASSESSMENT:  Patient is a 84 yo female with pancreatic cancer. She presents to ED with chief complaint of nausea and up to 10 episodes of vomiting. NGT placed left nare. Chest x-ray findings: possible ileus vs gastric outlet obstruction. Patient has a large hiatal hernia.   Palliative medicine consulted and will be discussing [place] on Monday. Patient is currently NPO with NGT to wall suction.   Home diet is regular. Primarily eats a breakfast, a light lunch and dinner daily. She uses Ensure supplements as a meal replacement. She takes Creon (24000 units) with meals. Able to feed herself. Despite her efforts to maintain or improve her nutrition intake she has experienced chronic severe wt loss indicating an extended suboptimal nutrition intake.  Patient weight history review shows significant loss of 24% over the past year. Seen in July and September for f/u abnormal wt loss  by GI- 92 lb (41.8 kg) loss of 7% the past 3 months down to her current 38.7 kg.    Medications reviewed and include: synthroid and IV fluids-NS @ 100 ml/hr  Labs: BMP Latest Ref Rng & Units 05/17/2018 05/16/2018 02/05/2018   Glucose 70 - 99 mg/dL 170(H) 174(H) 130  BUN 8 - 23 mg/dL 16 15 14   Creatinine 0.44 - 1.00 mg/dL 1.01(H) 1.00 1.12(H)  BUN/Creat Ratio 6 - 22 (calc) - - 13  Sodium 135 - 145 mmol/L 141 136 137  Potassium 3.5 - 5.1 mmol/L 3.7 3.1(L) 4.3  Chloride 98 - 111 mmol/L 98 95(L) 101  CO2 22 - 32 mmol/L 33(H) 30 30  Calcium 8.9 - 10.3 mg/dL 8.7(L) 8.8(L) 8.6     NUTRITION - FOCUSED PHYSICAL EXAM: Moderate temporal, clavicle and scapular muscle loss.    Diet Order:   Diet Order            Diet NPO time specified  Diet effective now              EDUCATION NEEDS:  Education needs have been addressed Skin:  Skin Assessment: Reviewed RN Assessment  Last BM:  12/11  Height:   Ht Readings from Last 1 Encounters:  05/17/18 5\' 4"  (1.626 m)    Weight:   Wt Readings from Last 1 Encounters:  05/17/18 38.7 kg    Ideal Body Weight:  55 kg  BMI:  Body mass index is 14.64 kg/m.  Estimated Nutritional Needs:   Kcal:  1694-5038 (35-39 kcal/kg/bw)  Protein:   51-59 (1.3-1.5 gr/kg/bw)  Fluid:  1100-1200 ml daily  Colman Cater MS,RD,CSG,LDN Office: [phone] Pager: [phone]

## 2018-05-17 NOTE — Progress Notes (Signed)
Notified Dr. Laural Golden of CT abdomen completed as requested. Donavan Foil, RN

## 2018-05-17 NOTE — ED Notes (Signed)
Report given to [facility]

## 2018-05-18 DIAGNOSIS — Z7189 Other specified counseling: Secondary | ICD-10-CM | POA: Diagnosis not present

## 2018-05-18 DIAGNOSIS — R112 Nausea with vomiting, unspecified: Secondary | ICD-10-CM | POA: Diagnosis not present

## 2018-05-18 DIAGNOSIS — K315 Obstruction of duodenum: Secondary | ICD-10-CM | POA: Diagnosis present

## 2018-05-18 DIAGNOSIS — K311 Adult hypertrophic pyloric stenosis: Secondary | ICD-10-CM | POA: Diagnosis present

## 2018-05-18 DIAGNOSIS — R1115 Cyclical vomiting syndrome unrelated to migraine: Secondary | ICD-10-CM | POA: Diagnosis present

## 2018-05-18 DIAGNOSIS — D649 Anemia, unspecified: Secondary | ICD-10-CM | POA: Diagnosis present

## 2018-05-18 DIAGNOSIS — K8681 Exocrine pancreatic insufficiency: Secondary | ICD-10-CM | POA: Diagnosis present

## 2018-05-18 DIAGNOSIS — Z7901 Long term (current) use of anticoagulants: Secondary | ICD-10-CM | POA: Diagnosis not present

## 2018-05-18 DIAGNOSIS — K449 Diaphragmatic hernia without obstruction or gangrene: Secondary | ICD-10-CM | POA: Diagnosis not present

## 2018-05-18 DIAGNOSIS — Z96642 Presence of left artificial hip joint: Secondary | ICD-10-CM | POA: Diagnosis present

## 2018-05-18 DIAGNOSIS — Z8673 Personal history of transient ischemic attack (TIA), and cerebral infarction without residual deficits: Secondary | ICD-10-CM | POA: Diagnosis not present

## 2018-05-18 DIAGNOSIS — I119 Hypertensive heart disease without heart failure: Secondary | ICD-10-CM | POA: Diagnosis present

## 2018-05-18 DIAGNOSIS — E43 Unspecified severe protein-calorie malnutrition: Secondary | ICD-10-CM

## 2018-05-18 DIAGNOSIS — Z681 Body mass index (BMI) 19 or less, adult: Secondary | ICD-10-CM | POA: Diagnosis not present

## 2018-05-18 DIAGNOSIS — Z8249 Family history of ischemic heart disease and other diseases of the circulatory system: Secondary | ICD-10-CM | POA: Diagnosis not present

## 2018-05-18 DIAGNOSIS — Z515 Encounter for palliative care: Secondary | ICD-10-CM | POA: Diagnosis not present

## 2018-05-18 DIAGNOSIS — Z66 Do not resuscitate: Secondary | ICD-10-CM | POA: Diagnosis present

## 2018-05-18 DIAGNOSIS — Z9689 Presence of other specified functional implants: Secondary | ICD-10-CM | POA: Diagnosis present

## 2018-05-18 DIAGNOSIS — C259 Malignant neoplasm of pancreas, unspecified: Secondary | ICD-10-CM | POA: Diagnosis present

## 2018-05-18 DIAGNOSIS — Z79890 Hormone replacement therapy (postmenopausal): Secondary | ICD-10-CM | POA: Diagnosis not present

## 2018-05-18 DIAGNOSIS — E876 Hypokalemia: Secondary | ICD-10-CM | POA: Diagnosis present

## 2018-05-18 DIAGNOSIS — Z79899 Other long term (current) drug therapy: Secondary | ICD-10-CM | POA: Diagnosis not present

## 2018-05-18 DIAGNOSIS — C253 Malignant neoplasm of pancreatic duct: Secondary | ICD-10-CM | POA: Diagnosis not present

## 2018-05-18 DIAGNOSIS — I482 Chronic atrial fibrillation, unspecified: Secondary | ICD-10-CM | POA: Diagnosis present

## 2018-05-18 DIAGNOSIS — E039 Hypothyroidism, unspecified: Secondary | ICD-10-CM | POA: Diagnosis present

## 2018-05-18 DIAGNOSIS — K56609 Unspecified intestinal obstruction, unspecified as to partial versus complete obstruction: Secondary | ICD-10-CM | POA: Diagnosis not present

## 2018-05-18 LAB — COMPREHENSIVE METABOLIC PANEL
ALT: 30 U/L (ref 0–44)
AST: 20 U/L (ref 15–41)
Albumin: 2.6 g/dL — ABNORMAL LOW (ref 3.5–5.0)
Alkaline Phosphatase: 433 U/L — ABNORMAL HIGH (ref 38–126)
Anion gap: 6 (ref 5–15)
BUN: 13 mg/dL (ref 8–23)
CHLORIDE: 104 mmol/L (ref 98–111)
CO2: 30 mmol/L (ref 22–32)
Calcium: 7.7 mg/dL — ABNORMAL LOW (ref 8.9–10.3)
Creatinine, Ser: 0.85 mg/dL (ref 0.44–1.00)
GFR calc Af Amer: >60 mL/min (ref >60)
GFR calc non Af Amer: 59 mL/min — ABNORMAL LOW (ref >60)
Glucose, Bld: 99 mg/dL (ref 70–99)
POTASSIUM: 3.5 mmol/L (ref 3.5–5.1)
SODIUM: 140 mmol/L (ref 135–145)
Total Bilirubin: 1 mg/dL (ref 0.3–1.2)
Total Protein: 5.7 g/dL — ABNORMAL LOW (ref 6.5–8.1)

## 2018-05-18 LAB — CBC
HEMATOCRIT: 32 % — AB (ref 36.0–46.0)
Hemoglobin: 9.7 g/dL — ABNORMAL LOW (ref 12.0–15.0)
MCH: 28.3 pg (ref 26.0–34.0)
MCHC: 30.3 g/dL (ref 30.0–36.0)
MCV: 93.3 fL (ref 80.0–100.0)
Platelets: 266 10*3/uL (ref 150–400)
RBC: 3.43 MIL/uL — ABNORMAL LOW (ref 3.87–5.11)
RDW: 14.7 % (ref 11.5–15.5)
WBC: 6.9 10*3/uL (ref 4.0–10.5)
nRBC: 0 % (ref 0.0–0.2)

## 2018-05-18 MED ORDER — MENTHOL 3 MG MT LOZG
1.0000 | LOZENGE | OROMUCOSAL | Status: DC | PRN
  Filled 2018-05-18: qty 9

## 2018-05-18 MED ORDER — PHENOL 1.4 % MT LIQD
1.0000 | OROMUCOSAL | Status: DC | PRN
  Administered 2018-05-18: 1 via OROMUCOSAL
  Filled 2018-05-18: qty 177

## 2018-05-18 MED ORDER — CEPASTAT 14.5 MG MT LOZG
1.0000 | LOZENGE | OROMUCOSAL | Status: DC | PRN
  Filled 2018-05-18: qty 9

## 2018-05-18 MED ORDER — POTASSIUM CHLORIDE IN NACL 20-0.45 MEQ/L-% IV SOLN
INTRAVENOUS | Status: DC
  Administered 2018-05-18 – 2018-05-19 (×2): via INTRAVENOUS
  Filled 2018-05-18 (×5): qty 1000

## 2018-05-18 NOTE — Progress Notes (Addendum)
Dr Laural Golden called primary nurse to unclamp NG tube for approximately 10 minutes. Pt denies nausea or abdominal pain at this time. NGT checked for placement by auscultation. NGT applied to wall suction with 0 cc output collected over 10 minutes.

## 2018-05-18 NOTE — Progress Notes (Signed)
Without complaints today.  She was reading the paper and conversing with the family members in attendance when I entered the room. Patient consumed all of her clear liquid breakfast tray without difficulty.  Denies nausea or abdominal swelling.  No abdominal pain currently. Nursing staff states NG un-clamped last night 300 cc of fluid returned; clamped since that time   Vital signs in last 24 hours: Temp:  [97.6 F (36.4 C)-98.1 F (36.7 C)] 97.6 F (36.4 C) (12/14 0553) Pulse Rate:  [88-111] 88 (12/14 0553) Resp:  [16-20] 18 (12/14 0553) BP: (134-141)/(75-81) 134/81 (12/14 0553) SpO2:  [94 %-96 %] 94 % (12/14 0553) Last BM Date: 05/15/18 General:   Elderly Alert,  pleasant and cooperative in NAD Abdomen: Nondistended.  Positive bowel sounds.  Soft and nontender Extremities:  Without clubbing or edema.    Intake/Output from previous day: 12/13 0701 - 12/14 0700 In: 2537 [P.O.:360; I.V.:2077; IV Piggyback:100] Out: 200 [Emesis/NG output:200] Intake/Output this shift: No intake/output data recorded.  Lab Results: Recent Labs    05/16/18 2248 05/17/18 0438 05/18/18 0656  WBC 13.1* 10.6* 6.9  HGB 11.5* 10.8* 9.7*  HCT 36.0 34.8* 32.0*  PLT 322 305 266   BMET Recent Labs    05/16/18 2248 05/17/18 0438 05/18/18 0656  NA 136 141 140  K 3.1* 3.7 3.5  CL 95* 98 104  CO2 30 33* 30  GLUCOSE 174* 170* 99  BUN 15 16 13   CREATININE 1.00 1.01* 0.85  CALCIUM 8.8* 8.7* 7.7*   LFT Recent Labs    05/18/18 0656  PROT 5.7*  ALBUMIN 2.6*  AST 20  ALT 30  ALKPHOS 433*  BILITOT 1.0   PT/INR No results for input(s): LABPROT, INR in the last 72 hours. Hepatitis Panel No results for input(s): HEPBSAG, HCVAB, HEPAIGM, HEPBIGM in the last 72 hours. C-Diff No results for input(s): CDIFFTOX in the last 72 hours.  Studies/Results: Ct Abdomen Pelvis W Contrast  Result Date: 05/17/2018 CLINICAL DATA:  Interscapular pain, nausea and vomiting. History of pancreatic cancer with  biliary stenting last year. Suspected gastric outlet obstruction. EXAM: CT ABDOMEN AND PELVIS WITH CONTRAST TECHNIQUE: Multidetector CT imaging of the abdomen and pelvis was performed using the standard protocol following bolus administration of intravenous contrast. CONTRAST:  57mL OMNIPAQUE IOHEXOL 300 MG/ML  SOLN COMPARISON:  Abdominopelvic CT 02/21/2018. Abdominal MRI 11/11/2016. FINDINGS: Lower chest: Large hiatal hernia is incompletely visualized. Nasogastric tube tip is within the intrathoracic portion of the stomach. There are new small bilateral pleural effusions and mild left lower lobe atelectasis. The right lung base is clear. The heart is enlarged. There is atherosclerosis of the aorta and coronary arteries. Hepatobiliary: There is a stable cyst in the left hepatic lobe and pneumobilia post biliary stenting. The stent is in good position. No suspicious hepatic lesions are identified. There are multiple calcified gallstones. No gallbladder wall thickening. Pancreas: Again demonstrated is marked pancreatic parenchymal atrophy and marked pancreatic ductal dilatation. There is a large mass involving the pancreatic head which is ill-defined and likely obstructing the mid duodenum. This measures up to 4.4 x 3.2 cm on image 30/2. Spleen: Normal in size without focal abnormality. Adrenals/Urinary Tract: Both adrenal glands appear normal. The right kidney appears stable small cyst. The left kidney is small with cortical thinning, poor enhancement and no demonstrated excretion. Possible tiny bladder calculi. Stomach/Bowel: As above, pancreatic head mass surrounding the biliary stent likely obstructs the 2nd and 3rd portions of the duodenum. No other small bowel abnormalities are  identified. There are diverticular changes throughout the colon. Vascular/Lymphatic: Nodal assessment limited by the generalized soft tissue edema and paucity of intra-abdominal fat. No discrete enlarged lymph nodes identified. Diffuse  aortic and branch vessel atherosclerosis. No acute vascular findings are seen. The portal and superior mesenteric veins appear patent. Reproductive: Uterine atrophy.  No evidence of adnexal mass. Other: Anasarca with generalized soft tissue edema and a small amount of ascites. Musculoskeletal: No acute or significant osseous findings. Previous left total hip arthroplasty. Possible decubitus ulceration posterior to the left ischium without evidence of underlying fluid collection or bone destruction. There are degenerative changes throughout the spine associated with a scoliosis. IMPRESSION: 1. Enlarging mass involving the pancreatic head, adjacent to the biliary stent. This is likely obstructing the 2nd and 3rd portions of the duodenum. There is chronic obstruction of the pancreatic duct which is markedly enlarged. 2. No definite metastatic disease identified. 3. Large hiatal hernia. Nasogastric tube tip is within the intrathoracic portion of the stomach. 4. Anasarca with generalized soft tissue edema, bilateral pleural effusions and mild ascites. Electronically Signed   By: Richardean Sale M.D.   On: 05/17/2018 16:48   Dg Chest Portable 1 View  Result Date: 05/17/2018 CLINICAL DATA:  84 year old female with NG tube placement. EXAM: PORTABLE CHEST 1 VIEW COMPARISON:  Earlier radiograph dated 05/16/2018 FINDINGS: Interval placement of an enteric tube. The side port of the tube is over the mediastinum, above the diaphragm, likely at the level of the gastroesophageal junction in the hiatal hernia and the tip is over the gastric air at the level of the right hemidiaphragm likely within the stomach in the hiatal hernia. Recommend further advancing of the tube by approximately 3-4 cm. Overall slight interval decrease in the gastric air within the hernia compared to the earlier radiograph. No other interval change since the prior radiograph. IMPRESSION: Interval placement of an enteric tube with side port likely at the  level of the GE junction and tip in the stomach within the hiatal hernia. Recommend further advancing the tube by approximately 3-4 cm. Electronically Signed   By: Anner Crete M.D.   On: 05/17/2018 01:28   Dg Chest Portable 1 View  Result Date: 05/16/2018 CLINICAL DATA:  Vomiting. EXAM: PORTABLE CHEST 1 VIEW COMPARISON:  12/29/2016. FINDINGS: Increased gas within the previously demonstrated large hiatal hernia with an increase in size of the hernia. Stable enlarged cardiac silhouette and ill-defined opacity at the left lung base. Clear right lung. Small bilateral pleural effusions. Old, healed left humeral neck fracture. Diffuse osteopenia. IMPRESSION: 1. Increased size and increased amount of gas within the previously demonstrated large hiatal hernia. This could be an indication of gastric outlet obstruction or ileus. 2. Small bilateral pleural effusions. 3. Continued possible left basilar atelectasis or pneumonia. Electronically Signed   By: Claudie Revering M.D.   On: 05/16/2018 23:14   Impression: Very pleasant 84 year old lady with metastatic pancreatic carcinoma producing extrahepatic biliary obstruction and now gastric outlet obstruction.  Fortunately, the biliary stent is functioning very well.  Steady growth of pancreatic tumor is now producing extrinsic compression on the duodenum. Seems to be tolerating clear liquids for now.  I think we need to go slow and keep her on clear liquids today -  leave NG in place.  Check residuals tomorrow morning and decide about advancing to full liquids.  I Reached out to [facility] and spoke to the NP on call this weekend about case. She will communicate with GI endoscopists down there  who place duodenal stents to assess feasibility in this patient.  Recommendations: Keep NG tube in place/clamped over the weekend.  Continue a clear liquid diet.  Check residuals first thing tomorrow morning; will decide about advancing to a full liquid diet. I see no need to  pursue any further imaging over the weekend.  Will await to hear from [facility] Monday as to the feasibility of stent placement.   May benefit from an upper GI contrast study as a "roadmap" , however, will await guidance the first of the week.  Discussed all with patient and family in attendance at the bedside today.

## 2018-05-18 NOTE — Progress Notes (Signed)
Subjective: She says she feels better.  Yesterday morning she had to have her tube reconnected to suction because she became nauseated.  It was clamped again yesterday evening and she is tolerated that well.  She had clear liquid diet and has tolerated that well so far.  She is not having any abdominal pain.  No significant nausea.  Objective: Vital signs in last 24 hours: Temp:  [97.6 F (36.4 C)-98.1 F (36.7 C)] 97.6 F (36.4 C) (12/14 0553) Pulse Rate:  [88-111] 88 (12/14 0553) Resp:  [16-20] 18 (12/14 0553) BP: (134-141)/(75-81) 134/81 (12/14 0553) SpO2:  [94 %-96 %] 94 % (12/14 0553) Weight change:  Last BM Date: 05/15/18  Intake/Output from previous day: 12/13 0701 - 12/14 0700 In: 2537 [P.O.:360; I.V.:2077; IV Piggyback:100] Out: 200 [Emesis/NG output:200]  PHYSICAL EXAM General appearance: alert, cooperative and Very thin Resp: clear to auscultation bilaterally Cardio: irregularly irregular rhythm GI: soft, non-tender; bowel sounds normal; no masses,  no organomegaly Extremities: extremities normal, atraumatic, no cyanosis or edema  Lab Results:  Results for orders placed or performed during the hospital encounter of 05/16/18 (from the past 48 hour(s))  CBC with Differential     Status: Abnormal   Collection Time: 05/16/18 10:48 PM  Result Value Ref Range   WBC 13.1 (H) 4.0 - 10.5 K/uL   RBC 3.92 3.87 - 5.11 MIL/uL   Hemoglobin 11.5 (L) 12.0 - 15.0 g/dL   HCT 36.0 36.0 - 46.0 %   MCV 91.8 80.0 - 100.0 fL   MCH 29.3 26.0 - 34.0 pg   MCHC 31.9 30.0 - 36.0 g/dL   RDW 14.4 11.5 - 15.5 %   Platelets 322 150 - 400 K/uL   nRBC 0.0 0.0 - 0.2 %   Neutrophils Relative % 87 %   Neutro Abs 11.4 (H) 1.7 - 7.7 K/uL   Lymphocytes Relative 7 %   Lymphs Abs 1.0 0.7 - 4.0 K/uL   Monocytes Relative 5 %   Monocytes Absolute 0.7 0.1 - 1.0 K/uL   Eosinophils Relative 0 %   Eosinophils Absolute 0.0 0.0 - 0.5 K/uL   Basophils Relative 0 %   Basophils Absolute 0.0 0.0 - 0.1 K/uL    Immature Granulocytes 1 %   Abs Immature Granulocytes 0.07 0.00 - 0.07 K/uL    Comment: Performed at [facility], [address] [place], [place] [address]  Comprehensive metabolic panel     Status: Abnormal   Collection Time: 05/16/18 10:48 PM  Result Value Ref Range   Sodium 136 135 - 145 mmol/L   Potassium 3.1 (L) 3.5 - 5.1 mmol/L   Chloride 95 (L) 98 - 111 mmol/L   CO2 30 22 - 32 mmol/L   Glucose, Bld 174 (H) 70 - 99 mg/dL   BUN 15 8 - 23 mg/dL   Creatinine, Ser 1.00 0.44 - 1.00 mg/dL   Calcium 8.8 (L) 8.9 - 10.3 mg/dL   Total Protein 7.6 6.5 - 8.1 g/dL   Albumin 3.5 3.5 - 5.0 g/dL   AST 27 15 - 41 U/L   ALT 50 (H) 0 - 44 U/L   Alkaline Phosphatase 687 (H) 38 - 126 U/L   Total Bilirubin 0.9 0.3 - 1.2 mg/dL   GFR calc non Af Amer 49 (L) >60 mL/min   GFR calc Af Amer 56 (L) >60 mL/min   Anion gap 11 5 - 15    Comment: Performed at [facility], [address], [place], [place] [address]  Lipase,  blood     Status: None   Collection Time: 05/16/18 10:48 PM  Result Value Ref Range   Lipase 18 11 - 51 U/L    Comment: Performed at [facility], [address], [place], [place] [address]  Type and screen     Status: None   Collection Time: 05/16/18 10:48 PM  Result Value Ref Range   ABO/RH(D) A POS    Antibody Screen NEG    Sample Expiration      05/19/2018 Performed at [facility], [address] [place], [place] [address]   CBC     Status: Abnormal   Collection Time: 05/17/18  4:38 AM  Result Value Ref Range   WBC 10.6 (H) 4.0 - 10.5 K/uL   RBC 3.82 (L) 3.87 - 5.11 MIL/uL   Hemoglobin 10.8 (L) 12.0 - 15.0 g/dL   HCT 34.8 (L) 36.0 - 46.0 %   MCV 91.1 80.0 - 100.0 fL   MCH 28.3 26.0 - 34.0 pg   MCHC 31.0 30.0 - 36.0 g/dL   RDW 14.5 11.5 - 15.5 %   Platelets 305 150 - 400 K/uL   nRBC 0.0 0.0 - 0.2 %    Comment: Performed at [facility], [address], [place], [place] [address]  Comprehensive metabolic panel     Status: Abnormal   Collection Time: 05/17/18  4:38 AM   Result Value Ref Range   Sodium 141 135 - 145 mmol/L   Potassium 3.7 3.5 - 5.1 mmol/L   Chloride 98 98 - 111 mmol/L   CO2 33 (H) 22 - 32 mmol/L   Glucose, Bld 170 (H) 70 - 99 mg/dL   BUN 16 8 - 23 mg/dL   Creatinine, Ser 1.01 (H) 0.44 - 1.00 mg/dL   Calcium 8.7 (L) 8.9 - 10.3 mg/dL   Total Protein 6.6 6.5 - 8.1 g/dL   Albumin 3.1 (L) 3.5 - 5.0 g/dL   AST 23 15 - 41 U/L   ALT 41 0 - 44 U/L   Alkaline Phosphatase 590 (H) 38 - 126 U/L   Total Bilirubin 0.9 0.3 - 1.2 mg/dL   GFR calc non Af Amer 48 (L) >60 mL/min   GFR calc Af Amer 56 (L) >60 mL/min   Anion gap 10 5 - 15    Comment: Performed at [facility], [address], [place], [place] [address]  Comprehensive metabolic panel     Status: Abnormal   Collection Time: 05/18/18  6:56 AM  Result Value Ref Range   Sodium 140 135 - 145 mmol/L   Potassium 3.5 3.5 - 5.1 mmol/L   Chloride 104 98 - 111 mmol/L   CO2 30 22 - 32 mmol/L   Glucose, Bld 99 70 - 99 mg/dL   BUN 13 8 - 23 mg/dL   Creatinine, Ser 0.85 0.44 - 1.00 mg/dL   Calcium 7.7 (L) 8.9 - 10.3 mg/dL   Total Protein 5.7 (L) 6.5 - 8.1 g/dL   Albumin 2.6 (L) 3.5 - 5.0 g/dL   AST 20 15 - 41 U/L   ALT 30 0 - 44 U/L   Alkaline Phosphatase 433 (H) 38 - 126 U/L   Total Bilirubin 1.0 0.3 - 1.2 mg/dL   GFR calc non Af Amer 59 (L) >60 mL/min   GFR calc Af Amer >60 >60 mL/min   Anion gap 6 5 - 15    Comment: Performed at [facility], [address], [place], [place] [address]  CBC  Status: Abnormal   Collection Time: 05/18/18  6:56 AM  Result Value Ref Range   WBC 6.9 4.0 - 10.5 K/uL   RBC 3.43 (L) 3.87 - 5.11 MIL/uL   Hemoglobin 9.7 (L) 12.0 - 15.0 g/dL   HCT 32.0 (L) 36.0 - 46.0 %   MCV 93.3 80.0 - 100.0 fL   MCH 28.3 26.0 - 34.0 pg   MCHC 30.3 30.0 - 36.0 g/dL   RDW 14.7 11.5 - 15.5 %   Platelets 266 150 - 400 K/uL   nRBC 0.0 0.0 - 0.2 %    Comment: Performed at [facility], [address] [place], [place] [address]    ABGS No results for input(s): PHART,  PO2ART, TCO2, HCO3 in the last 72 hours.  Invalid input(s): PCO2 CULTURES No results found for this or any previous visit (from the past 240 hour(s)). Studies/Results: Ct Abdomen Pelvis W Contrast  Result Date: 05/17/2018 CLINICAL DATA:  Interscapular pain, nausea and vomiting. History of pancreatic cancer with biliary stenting last year. Suspected gastric outlet obstruction. EXAM: CT ABDOMEN AND PELVIS WITH CONTRAST TECHNIQUE: Multidetector CT imaging of the abdomen and pelvis was performed using the standard protocol following bolus administration of intravenous contrast. CONTRAST:  40mL OMNIPAQUE IOHEXOL 300 MG/ML  SOLN COMPARISON:  Abdominopelvic CT 02/21/2018. Abdominal MRI 11/11/2016. FINDINGS: Lower chest: Large hiatal hernia is incompletely visualized. Nasogastric tube tip is within the intrathoracic portion of the stomach. There are new small bilateral pleural effusions and mild left lower lobe atelectasis. The right lung base is clear. The heart is enlarged. There is atherosclerosis of the aorta and coronary arteries. Hepatobiliary: There is a stable cyst in the left hepatic lobe and pneumobilia post biliary stenting. The stent is in good position. No suspicious hepatic lesions are identified. There are multiple calcified gallstones. No gallbladder wall thickening. Pancreas: Again demonstrated is marked pancreatic parenchymal atrophy and marked pancreatic ductal dilatation. There is a large mass involving the pancreatic head which is ill-defined and likely obstructing the mid duodenum. This measures up to 4.4 x 3.2 cm on image 30/2. Spleen: Normal in size without focal abnormality. Adrenals/Urinary Tract: Both adrenal glands appear normal. The right kidney appears stable small cyst. The left kidney is small with cortical thinning, poor enhancement and no demonstrated excretion. Possible tiny bladder calculi. Stomach/Bowel: As above, pancreatic head mass surrounding the biliary stent likely  obstructs the 2nd and 3rd portions of the duodenum. No other small bowel abnormalities are identified. There are diverticular changes throughout the colon. Vascular/Lymphatic: Nodal assessment limited by the generalized soft tissue edema and paucity of intra-abdominal fat. No discrete enlarged lymph nodes identified. Diffuse aortic and branch vessel atherosclerosis. No acute vascular findings are seen. The portal and superior mesenteric veins appear patent. Reproductive: Uterine atrophy.  No evidence of adnexal mass. Other: Anasarca with generalized soft tissue edema and a small amount of ascites. Musculoskeletal: No acute or significant osseous findings. Previous left total hip arthroplasty. Possible decubitus ulceration posterior to the left ischium without evidence of underlying fluid collection or bone destruction. There are degenerative changes throughout the spine associated with a scoliosis. IMPRESSION: 1. Enlarging mass involving the pancreatic head, adjacent to the biliary stent. This is likely obstructing the 2nd and 3rd portions of the duodenum. There is chronic obstruction of the pancreatic duct which is markedly enlarged. 2. No definite metastatic disease identified. 3. Large hiatal hernia. Nasogastric tube tip is within the intrathoracic portion of the stomach. 4. Anasarca with generalized soft tissue edema, bilateral  pleural effusions and mild ascites. Electronically Signed   By: Richardean Sale M.D.   On: 05/17/2018 16:48   Dg Chest Portable 1 View  Result Date: 05/17/2018 CLINICAL DATA:  84 year old female with NG tube placement. EXAM: PORTABLE CHEST 1 VIEW COMPARISON:  Earlier radiograph dated 05/16/2018 FINDINGS: Interval placement of an enteric tube. The side port of the tube is over the mediastinum, above the diaphragm, likely at the level of the gastroesophageal junction in the hiatal hernia and the tip is over the gastric air at the level of the right hemidiaphragm likely within the  stomach in the hiatal hernia. Recommend further advancing of the tube by approximately 3-4 cm. Overall slight interval decrease in the gastric air within the hernia compared to the earlier radiograph. No other interval change since the prior radiograph. IMPRESSION: Interval placement of an enteric tube with side port likely at the level of the GE junction and tip in the stomach within the hiatal hernia. Recommend further advancing the tube by approximately 3-4 cm. Electronically Signed   By: Anner Crete M.D.   On: 05/17/2018 01:28   Dg Chest Portable 1 View  Result Date: 05/16/2018 CLINICAL DATA:  Vomiting. EXAM: PORTABLE CHEST 1 VIEW COMPARISON:  12/29/2016. FINDINGS: Increased gas within the previously demonstrated large hiatal hernia with an increase in size of the hernia. Stable enlarged cardiac silhouette and ill-defined opacity at the left lung base. Clear right lung. Small bilateral pleural effusions. Old, healed left humeral neck fracture. Diffuse osteopenia. IMPRESSION: 1. Increased size and increased amount of gas within the previously demonstrated large hiatal hernia. This could be an indication of gastric outlet obstruction or ileus. 2. Small bilateral pleural effusions. 3. Continued possible left basilar atelectasis or pneumonia. Electronically Signed   By: Claudie Revering M.D.   On: 05/16/2018 23:14    Medications:  Prior to Admission:  Medications Prior to Admission  Medication Sig Dispense Refill Last Dose  . diltiazem (CARDIZEM CD) 120 MG 24 hr capsule TAKE 1 CAPSULE BY MOUTH ONCE DAILY 90 capsule 3 VERIFY LAST DOSE  . levothyroxine (SYNTHROID, LEVOTHROID) 125 MCG tablet Take 125 mcg by mouth daily before breakfast.   12 VERIFY LAST DOSE  . lipase/protease/amylase (CREON) 12000 units CPEP capsule Take 2 capsules (24,000 Units total) by mouth 3 (three) times daily with meals. 1 capsule with each snack.  Can have 2 snacks per day. 240 capsule 5 VERIFY LAST DOSE  . acetaminophen  (TYLENOL) 325 MG tablet Take 325 mg by mouth every 6 (six) hours as needed (pain/headache).    Taking  . apixaban (ELIQUIS) 2.5 MG TABS tablet Take 1 tablet (2.5 mg total) by mouth 2 (two) times daily. 60 tablet 6 Taking   Scheduled: . levothyroxine  50 mcg Intravenous Daily   Continuous: . 0.45 % NaCl with KCl 20 mEq / L    . cefTRIAXone (ROCEPHIN)  IV Stopped (05/17/18 1743)   TGY:BWLSLHTDS, menthol-cetylpyridinium, metoprolol tartrate, phenol, promethazine  Assesment: She was admitted severe intractable nausea and vomiting.  She produced a great deal of nasogastric secretions.  Based on CT it appears that she has duodenal obstruction from pancreatic cancer.  She is better.  She is probably going to remain on liquid diet from here on but perhaps on full liquids.  She did not respond well to medications for nausea and vomiting and required nasogastric tube drainage and she still has the tube in place which I will leave in place for now until we are certain that  she is going to be okay  She has chronic atrial fib and her anticoagulation is being held and likely that will not be restarted  She has atrial fib and she is on Lopressor now and may be able to start on diltiazem orally tomorrow if she does well with her nasogastric tube clamped Active Problems:   Gastric outlet obstruction   Protein-calorie malnutrition, severe    Plan: As above    LOS: 0 days   Tiffini Blacksher L 05/18/2018, 9:54 AM

## 2018-05-19 NOTE — Progress Notes (Signed)
Patient has done well with clear liquids.  NG residuals nil just before breakfast this morning.   Diet advanced to full liquids   Vital signs in last 24 hours: Temp:  [97.9 F (36.6 C)-98.3 F (36.8 C)] 98.3 F (36.8 C) (12/15 0523) Pulse Rate:  [85-95] 85 (12/15 0523) Resp:  [18] 18 (12/15 0523) BP: (146-162)/(73-98) 146/82 (12/15 0523) SpO2:  [93 %-96 %] 94 % (12/15 0523) Last BM Date: 05/15/18 General:   Alert,   pleasant and cooperative in NAD.  Accompanied by multiple family members. Abdomen: Nondistended.  Soft and nontender extremities:  Without clubbing or edema.    Intake/Output from previous day: 12/14 0701 - 12/15 0700 In: 1377.8 [P.O.:480; I.V.:797.8; IV Piggyback:100] Out: -  Intake/Output this shift: Total I/O In: 240 [P.O.:240] Out: -   Lab Results: Recent Labs    05/16/18 2248 05/17/18 0438 05/18/18 0656  WBC 13.1* 10.6* 6.9  HGB 11.5* 10.8* 9.7*  HCT 36.0 34.8* 32.0*  PLT 322 305 266   BMET Recent Labs    05/16/18 2248 05/17/18 0438 05/18/18 0656  NA 136 141 140  K 3.1* 3.7 3.5  CL 95* 98 104  CO2 30 33* 30  GLUCOSE 174* 170* 99  BUN 15 16 13   CREATININE 1.00 1.01* 0.85  CALCIUM 8.8* 8.7* 7.7*   LFT Recent Labs    05/18/18 0656  PROT 5.7*  ALBUMIN 2.6*  AST 20  ALT 30  ALKPHOS 433*  BILITOT 1.0   PT/INR No results for input(s): LABPROT, INR in the last 72 hours. Hepatitis Panel No results for input(s): HEPBSAG, HCVAB, HEPAIGM, HEPBIGM in the last 72 hours. C-Diff No results for input(s): CDIFFTOX in the last 72 hours.  Studies/Results: Ct Abdomen Pelvis W Contrast  Result Date: 05/17/2018 CLINICAL DATA:  Interscapular pain, nausea and vomiting. History of pancreatic cancer with biliary stenting last year. Suspected gastric outlet obstruction. EXAM: CT ABDOMEN AND PELVIS WITH CONTRAST TECHNIQUE: Multidetector CT imaging of the abdomen and pelvis was performed using the standard protocol following bolus administration of  intravenous contrast. CONTRAST:  2mL OMNIPAQUE IOHEXOL 300 MG/ML  SOLN COMPARISON:  Abdominopelvic CT 02/21/2018. Abdominal MRI 11/11/2016. FINDINGS: Lower chest: Large hiatal hernia is incompletely visualized. Nasogastric tube tip is within the intrathoracic portion of the stomach. There are new small bilateral pleural effusions and mild left lower lobe atelectasis. The right lung base is clear. The heart is enlarged. There is atherosclerosis of the aorta and coronary arteries. Hepatobiliary: There is a stable cyst in the left hepatic lobe and pneumobilia post biliary stenting. The stent is in good position. No suspicious hepatic lesions are identified. There are multiple calcified gallstones. No gallbladder wall thickening. Pancreas: Again demonstrated is marked pancreatic parenchymal atrophy and marked pancreatic ductal dilatation. There is a large mass involving the pancreatic head which is ill-defined and likely obstructing the mid duodenum. This measures up to 4.4 x 3.2 cm on image 30/2. Spleen: Normal in size without focal abnormality. Adrenals/Urinary Tract: Both adrenal glands appear normal. The right kidney appears stable small cyst. The left kidney is small with cortical thinning, poor enhancement and no demonstrated excretion. Possible tiny bladder calculi. Stomach/Bowel: As above, pancreatic head mass surrounding the biliary stent likely obstructs the 2nd and 3rd portions of the duodenum. No other small bowel abnormalities are identified. There are diverticular changes throughout the colon. Vascular/Lymphatic: Nodal assessment limited by the generalized soft tissue edema and paucity of intra-abdominal fat. No discrete enlarged lymph nodes identified. Diffuse aortic and  branch vessel atherosclerosis. No acute vascular findings are seen. The portal and superior mesenteric veins appear patent. Reproductive: Uterine atrophy.  No evidence of adnexal mass. Other: Anasarca with generalized soft tissue edema  and a small amount of ascites. Musculoskeletal: No acute or significant osseous findings. Previous left total hip arthroplasty. Possible decubitus ulceration posterior to the left ischium without evidence of underlying fluid collection or bone destruction. There are degenerative changes throughout the spine associated with a scoliosis. IMPRESSION: 1. Enlarging mass involving the pancreatic head, adjacent to the biliary stent. This is likely obstructing the 2nd and 3rd portions of the duodenum. There is chronic obstruction of the pancreatic duct which is markedly enlarged. 2. No definite metastatic disease identified. 3. Large hiatal hernia. Nasogastric tube tip is within the intrathoracic portion of the stomach. 4. Anasarca with generalized soft tissue edema, bilateral pleural effusions and mild ascites. Electronically Signed   By: Richardean Sale M.D.   On: 05/17/2018 16:48   Impression:  Metastatic pancreatic cancer with partial gastric outlet obstruction.  Clinically, stomach is emptying fluids.  It remains to be seen whether or not she is a candidate for duodenal stenting.  She is tolerating the NG tube very well thus far.  I feel that we should go ahead and just leave it in today and see how she does with full liquids; reassess tomorrow morning.  Recommendations:  Continue full liquids.  Dr. Laural Golden will reassess tomorrow morning.  Hopefully, we will get feedback regarding feasibility of duodenal stent placement. I discussed my  impression and recommendations with patient and multiple family members at the bedside today.

## 2018-05-19 NOTE — Progress Notes (Signed)
Subjective: She says she feels well.  No complaints.  No abdominal pain.  Residual in nasogastric tube is 0.  Tolerating full liquids.  Objective: Vital signs in last 24 hours: Temp:  [97.9 F (36.6 C)-98.3 F (36.8 C)] 98.3 F (36.8 C) (12/15 0523) Pulse Rate:  [85-95] 85 (12/15 0523) Resp:  [18] 18 (12/15 0523) BP: (146-162)/(73-98) 146/82 (12/15 0523) SpO2:  [93 %-96 %] 94 % (12/15 0523) Weight change:  Last BM Date: 05/15/18  Intake/Output from previous day: 12/14 0701 - 12/15 0700 In: 1377.8 [P.O.:480; I.V.:797.8; IV Piggyback:100] Out: -   PHYSICAL EXAM General appearance: alert, cooperative and no distress Resp: clear to auscultation bilaterally Cardio: regular rate and rhythm, S1, S2 normal, no murmur, click, rub or gallop GI: soft, non-tender; bowel sounds normal; no masses,  no organomegaly Extremities: extremities normal, atraumatic, no cyanosis or edema  Lab Results:  Results for orders placed or performed during the hospital encounter of 05/16/18 (from the past 48 hour(s))  Comprehensive metabolic panel     Status: Abnormal   Collection Time: 05/18/18  6:56 AM  Result Value Ref Range   Sodium 140 135 - 145 mmol/L   Potassium 3.5 3.5 - 5.1 mmol/L   Chloride 104 98 - 111 mmol/L   CO2 30 22 - 32 mmol/L   Glucose, Bld 99 70 - 99 mg/dL   BUN 13 8 - 23 mg/dL   Creatinine, Ser 0.85 0.44 - 1.00 mg/dL   Calcium 7.7 (L) 8.9 - 10.3 mg/dL   Total Protein 5.7 (L) 6.5 - 8.1 g/dL   Albumin 2.6 (L) 3.5 - 5.0 g/dL   AST 20 15 - 41 U/L   ALT 30 0 - 44 U/L   Alkaline Phosphatase 433 (H) 38 - 126 U/L   Total Bilirubin 1.0 0.3 - 1.2 mg/dL   GFR calc non Af Amer 59 (L) >60 mL/min   GFR calc Af Amer >60 >60 mL/min   Anion gap 6 5 - 15    Comment: Performed at [facility], [address], [place], [place] [address]  CBC     Status: Abnormal   Collection Time: 05/18/18  6:56 AM  Result Value Ref Range   WBC 6.9 4.0 - 10.5 K/uL   RBC 3.43 (L) 3.87 - 5.11 MIL/uL    Hemoglobin 9.7 (L) 12.0 - 15.0 g/dL   HCT 32.0 (L) 36.0 - 46.0 %   MCV 93.3 80.0 - 100.0 fL   MCH 28.3 26.0 - 34.0 pg   MCHC 30.3 30.0 - 36.0 g/dL   RDW 14.7 11.5 - 15.5 %   Platelets 266 150 - 400 K/uL   nRBC 0.0 0.0 - 0.2 %    Comment: Performed at [facility], [address] [place], [place] [address]    ABGS No results for input(s): PHART, PO2ART, TCO2, HCO3 in the last 72 hours.  Invalid input(s): PCO2 CULTURES No results found for this or any previous visit (from the past 240 hour(s)). Studies/Results: Ct Abdomen Pelvis W Contrast  Result Date: 05/17/2018 CLINICAL DATA:  Interscapular pain, nausea and vomiting. History of pancreatic cancer with biliary stenting last year. Suspected gastric outlet obstruction. EXAM: CT ABDOMEN AND PELVIS WITH CONTRAST TECHNIQUE: Multidetector CT imaging of the abdomen and pelvis was performed using the standard protocol following bolus administration of intravenous contrast. CONTRAST:  42mL OMNIPAQUE IOHEXOL 300 MG/ML  SOLN COMPARISON:  Abdominopelvic CT 02/21/2018. Abdominal MRI 11/11/2016. FINDINGS: Lower chest: Large hiatal hernia is incompletely visualized. Nasogastric tube tip  is within the intrathoracic portion of the stomach. There are new small bilateral pleural effusions and mild left lower lobe atelectasis. The right lung base is clear. The heart is enlarged. There is atherosclerosis of the aorta and coronary arteries. Hepatobiliary: There is a stable cyst in the left hepatic lobe and pneumobilia post biliary stenting. The stent is in good position. No suspicious hepatic lesions are identified. There are multiple calcified gallstones. No gallbladder wall thickening. Pancreas: Again demonstrated is marked pancreatic parenchymal atrophy and marked pancreatic ductal dilatation. There is a large mass involving the pancreatic head which is ill-defined and likely obstructing the mid duodenum. This measures up to 4.4 x 3.2 cm on image 30/2. Spleen:  Normal in size without focal abnormality. Adrenals/Urinary Tract: Both adrenal glands appear normal. The right kidney appears stable small cyst. The left kidney is small with cortical thinning, poor enhancement and no demonstrated excretion. Possible tiny bladder calculi. Stomach/Bowel: As above, pancreatic head mass surrounding the biliary stent likely obstructs the 2nd and 3rd portions of the duodenum. No other small bowel abnormalities are identified. There are diverticular changes throughout the colon. Vascular/Lymphatic: Nodal assessment limited by the generalized soft tissue edema and paucity of intra-abdominal fat. No discrete enlarged lymph nodes identified. Diffuse aortic and branch vessel atherosclerosis. No acute vascular findings are seen. The portal and superior mesenteric veins appear patent. Reproductive: Uterine atrophy.  No evidence of adnexal mass. Other: Anasarca with generalized soft tissue edema and a small amount of ascites. Musculoskeletal: No acute or significant osseous findings. Previous left total hip arthroplasty. Possible decubitus ulceration posterior to the left ischium without evidence of underlying fluid collection or bone destruction. There are degenerative changes throughout the spine associated with a scoliosis. IMPRESSION: 1. Enlarging mass involving the pancreatic head, adjacent to the biliary stent. This is likely obstructing the 2nd and 3rd portions of the duodenum. There is chronic obstruction of the pancreatic duct which is markedly enlarged. 2. No definite metastatic disease identified. 3. Large hiatal hernia. Nasogastric tube tip is within the intrathoracic portion of the stomach. 4. Anasarca with generalized soft tissue edema, bilateral pleural effusions and mild ascites. Electronically Signed   By: Richardean Sale M.D.   On: 05/17/2018 16:48    Medications:  Prior to Admission:  Medications Prior to Admission  Medication Sig Dispense Refill Last Dose  . diltiazem  (CARDIZEM CD) 120 MG 24 hr capsule TAKE 1 CAPSULE BY MOUTH ONCE DAILY 90 capsule 3 VERIFY LAST DOSE  . levothyroxine (SYNTHROID, LEVOTHROID) 125 MCG tablet Take 125 mcg by mouth daily before breakfast.   12 VERIFY LAST DOSE  . lipase/protease/amylase (CREON) 12000 units CPEP capsule Take 2 capsules (24,000 Units total) by mouth 3 (three) times daily with meals. 1 capsule with each snack.  Can have 2 snacks per day. 240 capsule 5 VERIFY LAST DOSE  . acetaminophen (TYLENOL) 325 MG tablet Take 325 mg by mouth every 6 (six) hours as needed (pain/headache).    Taking  . apixaban (ELIQUIS) 2.5 MG TABS tablet Take 1 tablet (2.5 mg total) by mouth 2 (two) times daily. 60 tablet 6 Taking   Scheduled: . levothyroxine  50 mcg Intravenous Daily   Continuous: . 0.45 % NaCl with KCl 20 mEq / L 50 mL/hr at 05/18/18 1112  . cefTRIAXone (ROCEPHIN)  IV Stopped (05/18/18 1758)   HDQ:QIWLNLGXQ, menthol-cetylpyridinium, metoprolol tartrate, phenol, promethazine  Assesment: She was admitted with gastric outlet obstruction and it looks like this is in the duodenum.  She had  intractable nausea and vomiting and that improved after she had nasogastric tube placement.  She is better.  Nasogastric tube is still in place.  She has protein calorie malnutrition severe which is ongoing.  She is eating now.  She has chronic atrial fib stable  She had a previous stroke from atrial fib but nothing recently  She has pancreatic cancer and is looking more toward not aggressively pursuing treatment Active Problems:   Gastric outlet obstruction   Protein-calorie malnutrition, severe   Emesis, persistent    Plan: Continue treatments.  Discontinue NG when okay with GI.    LOS: 1 day   Tanveer Dobberstein L 05/19/2018, 10:10 AM

## 2018-05-19 NOTE — Progress Notes (Signed)
NGT placement verified via auscultation.  No residual.  No complaints of pain or nausea.

## 2018-05-20 ENCOUNTER — Inpatient Hospital Stay (HOSPITAL_COMMUNITY): Payer: Medicare Other

## 2018-05-20 DIAGNOSIS — Z7189 Other specified counseling: Secondary | ICD-10-CM

## 2018-05-20 DIAGNOSIS — Z515 Encounter for palliative care: Secondary | ICD-10-CM

## 2018-05-20 LAB — CBC WITH DIFFERENTIAL/PLATELET
Abs Immature Granulocytes: 0.04 10*3/uL (ref 0.00–0.07)
Basophils Absolute: 0 10*3/uL (ref 0.0–0.1)
Basophils Relative: 0 %
Eosinophils Absolute: 0.1 10*3/uL (ref 0.0–0.5)
Eosinophils Relative: 2 %
HCT: 34.1 % — ABNORMAL LOW (ref 36.0–46.0)
Hemoglobin: 10.6 g/dL — ABNORMAL LOW (ref 12.0–15.0)
IMMATURE GRANULOCYTES: 0 %
Lymphocytes Relative: 13 %
Lymphs Abs: 1.2 10*3/uL (ref 0.7–4.0)
MCH: 29 pg (ref 26.0–34.0)
MCHC: 31.1 g/dL (ref 30.0–36.0)
MCV: 93.4 fL (ref 80.0–100.0)
Monocytes Absolute: 0.6 10*3/uL (ref 0.1–1.0)
Monocytes Relative: 6 %
Neutro Abs: 7 10*3/uL (ref 1.7–7.7)
Neutrophils Relative %: 79 %
Platelets: 288 10*3/uL (ref 150–400)
RBC: 3.65 MIL/uL — ABNORMAL LOW (ref 3.87–5.11)
RDW: 14.3 % (ref 11.5–15.5)
WBC: 9 10*3/uL (ref 4.0–10.5)
nRBC: 0 % (ref 0.0–0.2)

## 2018-05-20 LAB — COMPREHENSIVE METABOLIC PANEL
ALT: 24 U/L (ref 0–44)
AST: 20 U/L (ref 15–41)
Albumin: 2.6 g/dL — ABNORMAL LOW (ref 3.5–5.0)
Alkaline Phosphatase: 414 U/L — ABNORMAL HIGH (ref 38–126)
Anion gap: 6 (ref 5–15)
BILIRUBIN TOTAL: 0.5 mg/dL (ref 0.3–1.2)
BUN: 9 mg/dL (ref 8–23)
CO2: 28 mmol/L (ref 22–32)
Calcium: 8.3 mg/dL — ABNORMAL LOW (ref 8.9–10.3)
Chloride: 102 mmol/L (ref 98–111)
Creatinine, Ser: 0.86 mg/dL (ref 0.44–1.00)
GFR calc Af Amer: >60 mL/min (ref >60)
GFR calc non Af Amer: 58 mL/min — ABNORMAL LOW (ref >60)
Glucose, Bld: 123 mg/dL — ABNORMAL HIGH (ref 70–99)
Potassium: 4.8 mmol/L (ref 3.5–5.1)
Sodium: 136 mmol/L (ref 135–145)
Total Protein: 5.9 g/dL — ABNORMAL LOW (ref 6.5–8.1)

## 2018-05-20 MED ORDER — IOPAMIDOL (ISOVUE-300) INJECTION 61%
INTRAVENOUS | Status: AC
  Administered 2018-05-20: 300 mL via NASOGASTRIC
  Filled 2018-05-20: qty 300

## 2018-05-20 NOTE — Progress Notes (Signed)
  Subjective:  Patient has no complaints.  She denies nausea vomiting abdominal pain..  She ate her breakfast and did not feel sick.  She had 5 bowel movements yesterday.  Objective: Blood pressure (!) 157/98, pulse (!) 103, temperature 97.6 F (36.4 C), temperature source Oral, resp. rate 18, height _0  (1.626 m), weight 38.7 kg, SpO2 98 %. Patient is alert and in no acute distress. NG tube is in place.  It is clamped. She is actually reading newspaper. Abdominal exam reveals normal bowel sounds.  On palpation abdomen is soft and nontender  Labs/studies Results:  CBC Latest Ref Rng & Units 05/20/2018 05/18/2018 05/17/2018  WBC 4.0 - 10.5 K/uL 9.0 6.9 10.6(H)  Hemoglobin 12.0 - 15.0 g/dL 10.6(L) 9.7(L) 10.8(L)  Hematocrit 36.0 - 46.0 % 34.1(L) 32.0(L) 34.8(L)  Platelets 150 - 400 K/uL 288 266 305    CMP Latest Ref Rng & Units 05/20/2018 05/18/2018 05/17/2018  Glucose 70 - 99 mg/dL 123(H) 99 170(H)  BUN 8 - 23 mg/dL _1 Creatinine 0.44 - 1.00 mg/dL 0.86 0.85 1.01(H)  Sodium 135 - 145 mmol/L 136 140 141  Potassium 3.5 - 5.1 mmol/L 4.8 3.5 3.7  Chloride 98 - 111 mmol/L 102 104 98  CO2 22 - 32 mmol/L 28 30 33(H)  Calcium 8.9 - 10.3 mg/dL 8.3(L) 7.7(L) 8.7(L)  Total Protein 6.5 - 8.1 g/dL 5.9(L) 5.7(L) 6.6  Total Bilirubin 0.3 - 1.2 mg/dL 0.5 1.0 0.9  Alkaline Phos 38 - 126 U/L 414(H) 433(H) 590(H)  AST 15 - 41 U/L _2 ALT 0 - 44 U/L 24 30 41    Hepatic Function Latest Ref Rng & Units 05/20/2018 05/18/2018 05/17/2018  Total Protein 6.5 - 8.1 g/dL 5.9(L) 5.7(L) 6.6  Albumin 3.5 - 5.0 g/dL 2.6(L) 2.6(L) 3.1(L)  AST 15 - 41 U/L _3 ALT 0 - 44 U/L 24 30 41  Alk Phosphatase 38 - 126 U/L 414(H) 433(H) 590(H)  Total Bilirubin 0.3 - 1.2 mg/dL 0.5 1.0 0.9  Bilirubin, Direct 0.0 - 0.2 mg/dL - - -     Assessment:  #1.  Gastric outlet obstruction secondary to duodenal stricture resulting from enlarging pancreatic adenocarcinoma.  NG tube has been clamped for 2 days  and she is tolerating full liquids.  Patient family leaning towards hospice but would appreciate to know if duodenal stenting feasible and prevent outlet symptoms.  #2.  Elevated serum alkaline phosphatase.  Transaminases and bilirubin are normal.  For she does not have a complete biliary stent exclusion.  She will definitely need anothor biliary stent before duodenal stent offered.  #3.  Anemia.  Mild anemia.  No evidence of GI bleed.  Recommendations:  Will proceed with tube study with Gastrografin and go from there. Discontinue ceftriaxone.

## 2018-05-20 NOTE — Care Management (Addendum)
Patient Information  SS - [id number]  Patient Name Regina [name], Regina [name] ([id number]) Sex Female DOB 06/07/1922  Room Bed  A314 A314-01  Patient Demographics   Address [place] [address] Phone [phone] (Home) *Preferred* E-mail Address [email or web address]  Patient Ethnicity & Race   Ethnic Group Patient Race  Not Hispanic or Latino White or Caucasian  Emergency Contact(s)   Name Relation Home Work Mobile  Abercrombie,James Son [phone]  [phone]  [facility] Daughter [phone]    Wynter, Isaacs Other [phone]  [phone]  Ronnie Doss Other   [phone]  Documents on File    Status Date Received Description  Documents for the Patient  EMR Patient Summary Not Received    [place] Not Received    [place] E-Signature HIPAA Notice of Privacy Received 86/76/72   Driver's License Not Received    Insurance Card Received 10/09/13 [facility]  Advance Directives/Living Will/HCPOA/POA Received 03/31/16 LIVING WILL  Insurance Card Received 08/16/15 MEDICARE  Insurance Card Not Received    [place] E-Signature HIPAA Notice of Privacy [place]     Advanced Beneficiary Notice (ABN) Not Received    Insurance Card Received 10/09/13 MEDICARE  Advance Directives/Living Will/HCPOA/POA  10/09/13   Other Photo ID Received 03/27/16   Release of Information Received 08/16/15 DPR Signed on 3.13.17 / Cancellation - No Show Policy RCGD  HIM ROI Authorization  04/03/16 RCGD--03/31/16 ERCP op note to PCP Luan Pulling)  HIM ROI Authorization  07/13/16 [facility]CGD--07/10/16 labs to PCP Luan Pulling)  Advance Directives/Living Will/HCPOA/POA  11/14/16   Advance Directives/Living Will/HCPOA/POA  11/14/16   Insurance Card Received 12/14/16 MEDICARE/RCGD/HHS  HIM ROI Authorization  12/14/16 RCGD--12/11/16 labs to PCP (hawkins)  AMB Correspondence  01/05/17 LETTER [facility] [place] Card Received 02/13/17  MEDICARE/RCGD/HHS  HIM ROI Authorization  02/19/17 RCGD--02/13/17 labs to PCP Luan Pulling)  HIM ROI Authorization  03/08/17 RCGD--03/05/17 preop labs to PCP Luan Pulling)  HIM ROI Authorization  05/10/17 RCGD--05/03/17 labs to PCP (hawkins)  AMB Correspondence Received 06/14/17 Stefano Gaul MD , [place] Received 04/01/18   HIM Release of Information Output (Deleted) 05/10/17 Orders/Results Individual  Documents for the Encounter  AOB (Assignment of Insurance Benefits) Not Received    E-signature AOB Signed 05/16/18   MEDICARE RIGHTS Not Received    E-signature Medicare Rights Signed 05/16/18   ED Patient Billing Extract   ED PB Billing Extract  Cardiac Monitoring Strip Received 05/17/18   EMS Run Sheet Received 05/17/18   Cardiac Monitoring Strip Shift Summary Received 05/17/18   Medicare Observation  05/17/18   Medicare Observation Received 05/20/18   After Visit Summary   IP After Visit Summary  Admission Information   Current Information   Attending Provider Admitting Provider Admission Type Admission Status  Sinda Du, MD Oswald Hillock, MD Emergency Admission (Confirmed)       Admission Date/Time Discharge Date Hospital Service Auth/Cert Status  09/47/09 10:05 PM  Internal Medicine [place] Unit Room/Bed   [facility] AP-DEPT 300 A314/A314-01        Admission   Complaint  [place]ough  Hospital Account   Name Acct ID Class Status Primary Coverage  Regina [name], Regina [name] [id number] Inpatient Open MEDICARE - MEDICARE PART A AND B      Guarantor Account (for [place] [id number])   Name Relation to Pt Service Area Active? Acct Type  Regina [name] Self [facility]SA Yes Personal/Family  Address Phone  [place], [place] [address] [phone](V)        Coverage Information (for Hospital Account [id number])   F/O Payor/Plan Precert #  MEDICARE/MEDICARE PART A AND B   Subscriber Subscriber #  Regina [name], Regina [name]  [id number]  Address Phone  [address] [place], [place] [address]        Care Everywhere ID:  [id number]

## 2018-05-20 NOTE — Progress Notes (Signed)
Subjective: She says she feels okay.  She tolerated full liquids but had multiple bowel movements last night.  She is leaning toward hospice care but wants to know about potential duodenal stent  Objective: Vital signs in last 24 hours: Temp:  [97.6 F (36.4 C)-98.2 F (36.8 C)] 97.6 F (36.4 C) (12/15 2224) Pulse Rate:  [84-103] 103 (12/15 2224) Resp:  [17-18] 18 (12/15 2224) BP: (157-165)/(92-98) 157/98 (12/15 2224) SpO2:  [92 %-100 %] 99 % (12/15 2224) Weight change:  Last BM Date: 05/19/18  Intake/Output from previous day: 12/15 0701 - 12/16 0700 In: 960 [P.O.:960] Out: -   PHYSICAL EXAM General appearance: alert, cooperative and Very thin and weak Resp: clear to auscultation bilaterally Cardio: regular rate and rhythm, S1, S2 normal, no murmur, click, rub or gallop GI: soft, non-tender; bowel sounds normal; no masses,  no organomegaly Extremities: extremities normal, atraumatic, no cyanosis or edema  Lab Results:  Results for orders placed or performed during the hospital encounter of 05/16/18 (from the past 48 hour(s))  CBC with Differential/Platelet     Status: Abnormal   Collection Time: 05/20/18  5:12 AM  Result Value Ref Range   WBC 9.0 4.0 - 10.5 K/uL   RBC 3.65 (L) 3.87 - 5.11 MIL/uL   Hemoglobin 10.6 (L) 12.0 - 15.0 g/dL   HCT 34.1 (L) 36.0 - 46.0 %   MCV 93.4 80.0 - 100.0 fL   MCH 29.0 26.0 - 34.0 pg   MCHC 31.1 30.0 - 36.0 g/dL   RDW 14.3 11.5 - 15.5 %   Platelets 288 150 - 400 K/uL   nRBC 0.0 0.0 - 0.2 %   Neutrophils Relative % 79 %   Neutro Abs 7.0 1.7 - 7.7 K/uL   Lymphocytes Relative 13 %   Lymphs Abs 1.2 0.7 - 4.0 K/uL   Monocytes Relative 6 %   Monocytes Absolute 0.6 0.1 - 1.0 K/uL   Eosinophils Relative 2 %   Eosinophils Absolute 0.1 0.0 - 0.5 K/uL   Basophils Relative 0 %   Basophils Absolute 0.0 0.0 - 0.1 K/uL   Immature Granulocytes 0 %   Abs Immature Granulocytes 0.04 0.00 - 0.07 K/uL    Comment: Performed at [facility], [address] [place], [place] [address]  Comprehensive metabolic panel     Status: Abnormal   Collection Time: 05/20/18  5:12 AM  Result Value Ref Range   Sodium 136 135 - 145 mmol/L   Potassium 4.8 3.5 - 5.1 mmol/L    Comment: DELTA CHECK NOTED   Chloride 102 98 - 111 mmol/L   CO2 28 22 - 32 mmol/L   Glucose, Bld 123 (H) 70 - 99 mg/dL   BUN 9 8 - 23 mg/dL   Creatinine, Ser 0.86 0.44 - 1.00 mg/dL   Calcium 8.3 (L) 8.9 - 10.3 mg/dL   Total Protein 5.9 (L) 6.5 - 8.1 g/dL   Albumin 2.6 (L) 3.5 - 5.0 g/dL   AST 20 15 - 41 U/L   ALT 24 0 - 44 U/L   Alkaline Phosphatase 414 (H) 38 - 126 U/L   Total Bilirubin 0.5 0.3 - 1.2 mg/dL   GFR calc non Af Amer 58 (L) >60 mL/min   GFR calc Af Amer >60 >60 mL/min   Anion gap 6 5 - 15    Comment: Performed at [facility], [address] [place], [place] [address]    ABGS No results for input(s): PHART, PO2ART, TCO2, HCO3 in the  last 72 hours.  Invalid input(s): PCO2 CULTURES No results found for this or any previous visit (from the past 240 hour(s)). Studies/Results: No results found.  Medications:  Prior to Admission:  Medications Prior to Admission  Medication Sig Dispense Refill Last Dose  . diltiazem (CARDIZEM CD) 120 MG 24 hr capsule TAKE 1 CAPSULE BY MOUTH ONCE DAILY 90 capsule 3 VERIFY LAST DOSE  . levothyroxine (SYNTHROID, LEVOTHROID) 125 MCG tablet Take 125 mcg by mouth daily before breakfast.   12 VERIFY LAST DOSE  . lipase/protease/amylase (CREON) 12000 units CPEP capsule Take 2 capsules (24,000 Units total) by mouth 3 (three) times daily with meals. 1 capsule with each snack.  Can have 2 snacks per day. 240 capsule 5 VERIFY LAST DOSE  . acetaminophen (TYLENOL) 325 MG tablet Take 325 mg by mouth every 6 (six) hours as needed (pain/headache).    Taking  . apixaban (ELIQUIS) 2.5 MG TABS tablet Take 1 tablet (2.5 mg total) by mouth 2 (two) times daily. 60 tablet 6 Taking   Scheduled: . levothyroxine  50 mcg Intravenous Daily    Continuous: . 0.45 % NaCl with KCl 20 mEq / L 50 mL/hr at 05/19/18 1319  . cefTRIAXone (ROCEPHIN)  IV 1 g (05/19/18 1756)   ZJI:RCVELFYBO, menthol-cetylpyridinium, metoprolol tartrate, phenol, promethazine  Assesment: She was admitted with gastric outlet obstruction which actually seems to be in the duodenum.  She had intractable nausea and vomiting from that.  This appears to be related to pancreatic cancer.  She is tolerating liquids so far.  She does want to know about duodenal stenting  She is very weak and tells me today that she is not much good for anything and that she is thinking about hospice care.  She has protein calorie malnutrition which is severe. Active Problems:   Gastric outlet obstruction   Protein-calorie malnutrition, severe   Emesis, persistent    Plan: I think palliative care is here today and I think it would be helped of the patient and the family to have discussion with them.  She may decide to go home with hospice but does want to hear about the duodenal stent    LOS: 2 days   Gabrielle Mester L 05/20/2018, 8:28 AM

## 2018-05-20 NOTE — Progress Notes (Signed)
Palliative:   Called back to room by family.   Patient is being discharged- she would like to go home with Hospice services- care manager order entered to arrange home Hospice services.   Mariana Kaufman, AGNP-C Palliative Medicine  Please call Palliative Medicine team phone with any questions [phone]. For individual providers please see AMION.  No charge

## 2018-05-20 NOTE — Progress Notes (Signed)
GI study images reviewed with the patient and her family members. Patient has very large hiatal hernia with organoaxial rotation and poor flow of contrast from herniated part of the stomach into distal stomach and duodenum. Duodenum is displaced somewhat but is not narrowed.  Thickened folds noted to the third part of the duodenum.  Contrast did pass into small bowel. She seemed to empty better when propped up and on the right side. Question of duodenal compression from SMA.  Therefore patient's acute symptoms may be due to large hiatal hernia with organoaxial rotation.  Patient advised to eat multiple small meals and she should be on low-fat and low fiber diet. Patient advised to take her GERD pill daily. Findings discussed with Dr. Luan Pulling.

## 2018-05-20 NOTE — Consult Note (Signed)
Consultation Note Date: 05/20/2018   Patient Name: Regina [name]  DOB: [birth year]  MRN: [id number]  Age / Sex: 84 y.o., female  PCP: Sinda Du, MD Referring Physician: Sinda Du, MD  Reason for Consultation: Establishing goals of care  HPI/Patient Profile: 84 y.o. female  with past medical history of malignant bile duct stricture s/p stent in 2018, hiatal hernia, HTN, CVA, recent diagnosis of pancreatic cancer with significant weight loss starting in February- PET 11/12 showed pancreatic head mass- patient was offered hospice vs SBRT and she had not yet decided when she was admitted on 05/16/2018 with intractable nausea and vomiting. CT scan revealed duodenal obstruction due to pancreatic mass. Symptoms are now resolved with NG tube in place- has been clamped for two days and she is tolerating liquid diet. GI has been consulted and patient considering duodenal stent. Palliative medicine consulted for assistance in decision making.    Clinical Assessment and Goals of Care:  I have reviewed medical records including EPIC notes, labs and imaging, assessed the patient and then met at the bedside along with patient, her son Jeneen Rinks, daughter in law [place], and daughter Gwyndolyn Saxon-   to discuss diagnosis prognosis, [place], EOL wishes, disposition and options.  I introduced Palliative Medicine as specialized medical care for people living with serious illness. It focuses on providing relief from the symptoms and stress of a serious illness. The goal is to improve quality of life for both the patient and the family.  We discussed a brief life review of the patient. She studied history. She lives at home, her son and daughter in law live next door and check on her nightly, cook her dinner.  As far as functional and nutritional status- she ambulates in her home with her walker- is able to complete most ADL's  independently- requires some assistance with meal preparation and home maintenance- provided by her daughter in law and son. She has a life alert and monitors.   We discussed her current illness and what it means in the larger context of her on-going co-morbidities.  Natural disease trajectory and expectations at EOL were discussed. We discussed her pancreatic cancer- she tells me that she has decided against aggressive treatment- her son in law had it and he and other providers have indicated to her that treatment would not give her a good quality of life.  I attempted to elicit values and goals of care important to the patient.  Maintaining her independence, living at home, and having her "alone time" for as long as possible are very important to her. Her goals are to live long enough to see the conclusion of the current presidency- she hopes to have the current president replaced. She hopes for good symptom management.  The difference between aggressive medical intervention and comfort care was explained considered in light of the patient's goals of care.   Advanced directives, concepts specific to code status, artifical feeding and hydration, and rehospitalization were considered and discussed. She has a DNR in place. She would prefer  not to be rehospitalized.  Hospice services outpatient were explained and offered.  Questions and concerns were addressed.   Primary Decision Maker PATIENT    SUMMARY OF RECOMMENDATIONS -Patient is considering Hospice care at home upon discharge -Duodenal stent may be helpful for symptoms if she can tolerate procedure- could also consider venting PEG -PMT will follow up with patient tomorrow -Discussed with family anticipatory care needs- will likely need increased care in the home as patient will continue to get weaker, decline over time- family states they have plans to move in with her as she declines    Code Status/Advance Care  Planning:  DNR  Palliative Prophylaxis:   Frequent Pain Assessment  Additional Recommendations (Limitations, Scope, Preferences):  No Chemotherapy  Psycho-social/Spiritual:   Desire for further Chaplaincy support:yes  Prognosis:    < 6 months due to aggressive cancer without aggressive treatment options being pursued  Discharge Planning: To Be Determined  Primary Diagnoses: Present on Admission: . Gastric outlet obstruction . Emesis, persistent   I have reviewed the medical record, interviewed the patient and family, and examined the patient. The following aspects are pertinent.  Past Medical History:  Diagnosis Date  . Anemia   . Arthritis   . Avascular necrosis of bone of left hip ([place])   . Dysrhythmia 4/14   atrial fib post op  . H/O hiatal hernia   . History of blood transfusion   . Hypertension   . Hypothyroidism   . Stroke [facility]) 2003   No deficits   Social History   Socioeconomic History  . Marital status: Widowed    Spouse name: Not on file  . Number of children: Not on file  . Years of education: Not on file  . Highest education level: Not on file  Occupational History  . Not on file  Social Needs  . Financial resource strain: Not hard at all  . Food insecurity:    Worry: Never true    Inability: Never true  . Transportation needs:    Medical: Yes    Non-medical: Yes  Tobacco Use  . Smoking status: Never Smoker  . Smokeless tobacco: Never Used  . Tobacco comment: Smoked a few times in college (socially)  Substance and Sexual Activity  . Alcohol use: No    Alcohol/week: 0.0 standard drinks    Comment: None since Christmas 2013  . Drug use: No  . Sexual activity: Never    Birth control/protection: None  Lifestyle  . Physical activity:    Days per week: 0 days    Minutes per session: 0 min  . Stress: Only a little  Relationships  . Social connections:    Talks on phone: More than three times a week    Gets together: More than three  times a week    Attends religious service: Never    Active member of club or organization: Yes    Attends meetings of clubs or organizations: Never    Relationship status: Widowed  Other Topics Concern  . Not on file  Social History Narrative  . Not on file   Family History  Problem Relation Age of Onset  . Heart attack Mother   . Heart attack Father   . Cancer Brother    Scheduled Meds: . levothyroxine  50 mcg Intravenous Daily   Continuous Infusions: . 0.45 % NaCl with KCl 20 mEq / L 50 mL/hr at 05/19/18 1319   PRN Meds:.iopamidol, menthol-cetylpyridinium, metoprolol tartrate, phenol, promethazine Medications Prior  to Admission:  Prior to Admission medications   Medication Sig Start Date End Date Taking? Authorizing Provider  diltiazem (CARDIZEM CD) 120 MG 24 hr capsule TAKE 1 CAPSULE BY MOUTH ONCE DAILY 11/19/17  Yes Herminio Commons, MD  levothyroxine (SYNTHROID, LEVOTHROID) 125 MCG tablet Take 125 mcg by mouth daily before breakfast.  01/12/18  Yes [provider]  lipase/protease/amylase (CREON) 12000 units CPEP capsule Take 2 capsules (24,000 Units total) by mouth 3 (three) times daily with meals. 1 capsule with each snack.  Can have 2 snacks per day. 03/07/18  Yes Rehman, Mechele Dawley, MD  acetaminophen (TYLENOL) 325 MG tablet Take 325 mg by mouth every 6 (six) hours as needed (pain/headache).     [provider]  apixaban (ELIQUIS) 2.5 MG TABS tablet Take 1 tablet (2.5 mg total) by mouth 2 (two) times daily. 03/11/17   Rehman, Mechele Dawley, MD  HYDROcodone-acetaminophen (NORCO/VICODIN) 5-325 MG tablet Take 1 tablet by mouth every 4 (four) hours as needed. 05/16/18   Evalee Jefferson, PA-C  HYDROcodone-acetaminophen (NORCO/VICODIN) 5-325 MG tablet Take 1 tablet by mouth every 4 (four) hours as needed for moderate pain. 05/16/18   Evalee Jefferson, PA-C  promethazine (PHENERGAN) 12.5 MG tablet Take 1 tablet (12.5 mg total) by mouth every 6 (six) hours as needed for nausea or  vomiting. 05/16/18   Evalee Jefferson, PA-C   No Known Allergies Review of Systems  Constitutional: Positive for activity change, appetite change and unexpected weight change.  Psychiatric/Behavioral: Negative for sleep disturbance.    Physical Exam Vitals signs and nursing note reviewed.  Constitutional:      Comments: frail  HENT:     Head: Normocephalic and atraumatic.  Cardiovascular:     Rate and Rhythm: Normal rate.  Pulmonary:     Effort: Pulmonary effort is normal.  Skin:    General: Skin is warm and dry.  Neurological:     Mental Status: She is alert and oriented to person, place, and time.  Psychiatric:        Mood and Affect: Mood normal.        Thought Content: Thought content normal.        Judgment: Judgment normal.     Vital Signs: BP (!) 157/98 (BP Location: Right Arm)   Pulse (!) 103   Temp 97.6 F (36.4 C) (Oral)   Resp 18   Ht '5\' 4"'  (1.626 m)   Wt 38.7 kg   SpO2 98%   BMI 14.64 kg/m  Pain Scale: 0-10   Pain Score: 0-No pain   SpO2: SpO2: 98 % O2 Device:SpO2: 98 % O2 Flow Rate: .   IO: Intake/output summary:   Intake/Output Summary (Last 24 hours) at 05/20/2018 1155 Last data filed at 05/20/2018 0900 Gross per 24 hour  Intake 960 ml  Output -  Net 960 ml    LBM: Last BM Date: 05/19/18 Baseline Weight: Weight: 40.1 kg Most recent weight: Weight: 38.7 kg     Palliative Assessment/Data: PPS: 50%   Flowsheet Rows     Most Recent Value  Intake Tab  Referral Department  Hospitalist  Unit at Time of Referral  ER  Palliative Care Primary Diagnosis  Cancer  Date Notified  05/17/18  Palliative Care Type  New Palliative care  Reason for referral  Clarify Goals of Care  Date of Admission  05/17/18  # of days IP prior to Palliative referral  0  Clinical Assessment  Psychosocial & Spiritual Assessment  Palliative Care Outcomes  Thank you for this consult. Palliative medicine will continue to follow and assist as needed.   Time In:  1030 Time Out: 1145 Time Total: 75 minutes Greater than 50%  of this time was spent counseling and coordinating care related to the above assessment and plan.  Signed by: Mariana Kaufman, AGNP-C Palliative Medicine    Please contact Palliative Medicine Team phone at [phone] for questions and concerns.  For individual provider: See Shea Evans

## 2018-05-20 NOTE — Care Management Important Message (Signed)
Important Message  Patient Details  Name: Regina [name] MRN: [id number] Date of Birth: November 28, 1922   Medicare Important Message Given:  Yes    Shelda Altes 05/20/2018, 11:17 AM

## 2018-05-20 NOTE — Progress Notes (Signed)
Patient and family state understanding of discharge instructions 

## 2018-05-21 DIAGNOSIS — R531 Weakness: Secondary | ICD-10-CM | POA: Diagnosis not present

## 2018-05-21 DIAGNOSIS — R630 Anorexia: Secondary | ICD-10-CM | POA: Diagnosis not present

## 2018-05-21 DIAGNOSIS — I10 Essential (primary) hypertension: Secondary | ICD-10-CM | POA: Diagnosis not present

## 2018-05-21 DIAGNOSIS — I679 Cerebrovascular disease, unspecified: Secondary | ICD-10-CM | POA: Diagnosis not present

## 2018-05-21 DIAGNOSIS — E43 Unspecified severe protein-calorie malnutrition: Secondary | ICD-10-CM | POA: Diagnosis not present

## 2018-05-21 DIAGNOSIS — E039 Hypothyroidism, unspecified: Secondary | ICD-10-CM | POA: Diagnosis not present

## 2018-05-21 NOTE — Discharge Summary (Signed)
Physician Discharge Summary  Patient ID: Regina [name] MRN: [id number] DOB/AGE: 1923-03-19 84 y.o. Primary Care Physician:Naida Escalante, Percell Miller, MD Admit date: 05/16/2018 Discharge date: 05/21/2018    Discharge Diagnoses:   Active Problems:   Gastric outlet obstruction   Protein-calorie malnutrition, severe   Emesis, persistent   Goals of care, counseling/discussion   Palliative care by specialist   Advanced care planning/counseling discussion Pancreatic cancer Large hiatal hernia Chronic atrial fib  Allergies as of 05/20/2018   No Known Allergies     Medication List    TAKE these medications   acetaminophen 325 MG tablet Commonly known as:  TYLENOL Take 325 mg by mouth every 6 (six) hours as needed (pain/headache).   apixaban 2.5 MG Tabs tablet Commonly known as:  ELIQUIS Take 1 tablet (2.5 mg total) by mouth 2 (two) times daily.   diltiazem 120 MG 24 hr capsule Commonly known as:  CARDIZEM CD TAKE 1 CAPSULE BY MOUTH ONCE DAILY   levothyroxine 125 MCG tablet Commonly known as:  SYNTHROID, LEVOTHROID Take 125 mcg by mouth daily before breakfast.   lipase/protease/amylase 12000 units Cpep capsule Commonly known as:  CREON Take 2 capsules (24,000 Units total) by mouth 3 (three) times daily with meals. 1 capsule with each snack.  Can have 2 snacks per day.   promethazine 12.5 MG tablet Commonly known as:  PHENERGAN Take 1 tablet (12.5 mg total) by mouth every 6 (six) hours as needed for nausea or vomiting.       Discharged Condition: Improved    Consults: Gastroenterology  Significant Diagnostic Studies: Ct Abdomen Pelvis W Contrast  Result Date: 05/17/2018 CLINICAL DATA:  Interscapular pain, nausea and vomiting. History of pancreatic cancer with biliary stenting last year. Suspected gastric outlet obstruction. EXAM: CT ABDOMEN AND PELVIS WITH CONTRAST TECHNIQUE: Multidetector CT imaging of the abdomen and pelvis was performed using the standard protocol  following bolus administration of intravenous contrast. CONTRAST:  80mL OMNIPAQUE IOHEXOL 300 MG/ML  SOLN COMPARISON:  Abdominopelvic CT 02/21/2018. Abdominal MRI 11/11/2016. FINDINGS: Lower chest: Large hiatal hernia is incompletely visualized. Nasogastric tube tip is within the intrathoracic portion of the stomach. There are new small bilateral pleural effusions and mild left lower lobe atelectasis. The right lung base is clear. The heart is enlarged. There is atherosclerosis of the aorta and coronary arteries. Hepatobiliary: There is a stable cyst in the left hepatic lobe and pneumobilia post biliary stenting. The stent is in good position. No suspicious hepatic lesions are identified. There are multiple calcified gallstones. No gallbladder wall thickening. Pancreas: Again demonstrated is marked pancreatic parenchymal atrophy and marked pancreatic ductal dilatation. There is a large mass involving the pancreatic head which is ill-defined and likely obstructing the mid duodenum. This measures up to 4.4 x 3.2 cm on image 30/2. Spleen: Normal in size without focal abnormality. Adrenals/Urinary Tract: Both adrenal glands appear normal. The right kidney appears stable small cyst. The left kidney is small with cortical thinning, poor enhancement and no demonstrated excretion. Possible tiny bladder calculi. Stomach/Bowel: As above, pancreatic head mass surrounding the biliary stent likely obstructs the 2nd and 3rd portions of the duodenum. No other small bowel abnormalities are identified. There are diverticular changes throughout the colon. Vascular/Lymphatic: Nodal assessment limited by the generalized soft tissue edema and paucity of intra-abdominal fat. No discrete enlarged lymph nodes identified. Diffuse aortic and branch vessel atherosclerosis. No acute vascular findings are seen. The portal and superior mesenteric veins appear patent. Reproductive: Uterine atrophy.  No evidence of adnexal mass.  Other: Anasarca  with generalized soft tissue edema and a small amount of ascites. Musculoskeletal: No acute or significant osseous findings. Previous left total hip arthroplasty. Possible decubitus ulceration posterior to the left ischium without evidence of underlying fluid collection or bone destruction. There are degenerative changes throughout the spine associated with a scoliosis. IMPRESSION: 1. Enlarging mass involving the pancreatic head, adjacent to the biliary stent. This is likely obstructing the 2nd and 3rd portions of the duodenum. There is chronic obstruction of the pancreatic duct which is markedly enlarged. 2. No definite metastatic disease identified. 3. Large hiatal hernia. Nasogastric tube tip is within the intrathoracic portion of the stomach. 4. Anasarca with generalized soft tissue edema, bilateral pleural effusions and mild ascites. Electronically Signed   By: Richardean Sale M.D.   On: 05/17/2018 16:48   Dg Chest Portable 1 View  Result Date: 05/17/2018 CLINICAL DATA:  84 year old female with NG tube placement. EXAM: PORTABLE CHEST 1 VIEW COMPARISON:  Earlier radiograph dated 05/16/2018 FINDINGS: Interval placement of an enteric tube. The side port of the tube is over the mediastinum, above the diaphragm, likely at the level of the gastroesophageal junction in the hiatal hernia and the tip is over the gastric air at the level of the right hemidiaphragm likely within the stomach in the hiatal hernia. Recommend further advancing of the tube by approximately 3-4 cm. Overall slight interval decrease in the gastric air within the hernia compared to the earlier radiograph. No other interval change since the prior radiograph. IMPRESSION: Interval placement of an enteric tube with side port likely at the level of the GE junction and tip in the stomach within the hiatal hernia. Recommend further advancing the tube by approximately 3-4 cm. Electronically Signed   By: Anner Crete M.D.   On: 05/17/2018 01:28    Dg Chest Portable 1 View  Result Date: 05/16/2018 CLINICAL DATA:  Vomiting. EXAM: PORTABLE CHEST 1 VIEW COMPARISON:  12/29/2016. FINDINGS: Increased gas within the previously demonstrated large hiatal hernia with an increase in size of the hernia. Stable enlarged cardiac silhouette and ill-defined opacity at the left lung base. Clear right lung. Small bilateral pleural effusions. Old, healed left humeral neck fracture. Diffuse osteopenia. IMPRESSION: 1. Increased size and increased amount of gas within the previously demonstrated large hiatal hernia. This could be an indication of gastric outlet obstruction or ileus. 2. Small bilateral pleural effusions. 3. Continued possible left basilar atelectasis or pneumonia. Electronically Signed   By: Claudie Revering M.D.   On: 05/16/2018 23:14   Dg Duanne Limerick  W/kub  Result Date: 05/20/2018 CLINICAL DATA:  Pancreatic cancer, biliary obstruction post stenting, known hiatal hernia, gastric outlet obstruction EXAM: WATER SOLUBLE UPPER GI SERIES TECHNIQUE: Single-column upper GI series was performed using water soluble contrast. CONTRAST:  300 cc of Isovue-300 COMPARISON:  CT abdomen and pelvis 05/17/2018 FLUOROSCOPY TIME:  Fluoroscopy Time:  3 minutes 24 seconds Radiation Exposure Index (if provided by the fluoroscopic device): 48.4 mGy Number of Acquired Spot Images: 1 plus multiple fluoroscopic screen captures FINDINGS: Nonobstructive bowel gas pattern. CBD wall stent RIGHT upper quadrant. Bones demineralized. Atherosclerotic calcifications aorta and iliac arteries. Contrast was administered via indwelling nasogastric tube. Large hiatal hernia with the majority of the stomach located in the inferior mediastinum, with greater curve located cranially consistent with organo-axial volvulus. Small amount of reflux of contrast into the distal esophagus was noted. No gross evidence of gastric mass or ulcer. Rugal fold of grossly normal thickness. Elongation of distal gastric  antrum  and pylorus across the diaphragm into the upper abdomen. First and proximal second portions of the duodenum appear elongated and effaced, likely related to mass effect and question involvement from known pancreatic tumor. Mild thickening of folds at the distal second portion of the duodenum is also seen. Third portion of the duodenum shows relative narrowing at the segment that traverses the spine but is otherwise nonobstructed. Contrast is present within small bowel loops in the LEFT upper quadrant LEFT mid abdomen. IMPRESSION: Mass effect upon the first and second portions of the duodenum with effacement of mucosal folds likely related to known pancreatic tumor. Fold thickening at the distal second portion of the duodenum as well. No evidence of duodenal obstruction or gastric outlet obstruction. Large hiatal hernia with majority of stomach in chest, flipped with greater curve located cranially, consistent with organoaxial volvulus. Electronically Signed   By: Lavonia Dana M.D.   On: 05/20/2018 13:41    Lab Results: Basic Metabolic Panel: Recent Labs    05/20/18 0512  NA 136  K 4.8  CL 102  CO2 28  GLUCOSE 123*  BUN 9  CREATININE 0.86  CALCIUM 8.3*   Liver Function Tests: Recent Labs    05/20/18 0512  AST 20  ALT 24  ALKPHOS 414*  BILITOT 0.5  PROT 5.9*  ALBUMIN 2.6*     CBC: Recent Labs    05/20/18 0512  WBC 9.0  NEUTROABS 7.0  HGB 10.6*  HCT 34.1*  MCV 93.4  PLT 288    No results found for this or any previous visit (from the past 240 hour(s)).   Hospital Course: This is a 84 year old whose had a fairly recent diagnosis of pancreatic cancer.  She came to the emergency department because of intractable nausea and vomiting.  Nasogastric tube was placed and she produced almost 2 L of gastric fluid.  She had what looked like duodenal obstruction on CT.  She improved with nasogastric suction then was able to tolerate clear liquids.  She had a Gastrografin study done  before discharge and that actually showed that the obstruction looked more like it was from her large hiatal hernia without obstruction from pancreatic cancer.  She has elected hospice care and was discharged home in improved condition with hospice.  Discharge Exam: Blood pressure 136/85, pulse 79, temperature 97.6 F (36.4 C), temperature source Oral, resp. rate 18, height 5\' 4"  (1.626 m), weight 38.7 kg, SpO2 97 %. She is very thin.  Chest is clear.  Heart is regular  Disposition: Home with hospice care    Follow-up Information    Derek Jack, MD.   Specialty:  Hematology Contact information: [place] [address] [phone]           Signed: Alonza Bogus   05/21/2018, 9:00 AM

## 2018-05-23 ENCOUNTER — Encounter (HOSPITAL_COMMUNITY): Payer: Self-pay | Admitting: *Deleted

## 2018-05-23 DIAGNOSIS — E43 Unspecified severe protein-calorie malnutrition: Secondary | ICD-10-CM | POA: Diagnosis not present

## 2018-05-23 DIAGNOSIS — E039 Hypothyroidism, unspecified: Secondary | ICD-10-CM | POA: Diagnosis not present

## 2018-05-23 DIAGNOSIS — I10 Essential (primary) hypertension: Secondary | ICD-10-CM | POA: Diagnosis not present

## 2018-05-23 DIAGNOSIS — R531 Weakness: Secondary | ICD-10-CM | POA: Diagnosis not present

## 2018-05-23 DIAGNOSIS — I679 Cerebrovascular disease, unspecified: Secondary | ICD-10-CM | POA: Diagnosis not present

## 2018-05-23 DIAGNOSIS — R630 Anorexia: Secondary | ICD-10-CM | POA: Diagnosis not present

## 2018-05-23 NOTE — Progress Notes (Signed)
Patient's daughter-in-law, Benjamine Mola, called and stated that after her most recent hospitalization Ms. Cuervo decided to pursue Hospice care with [facility]. They have already been to the home for initial assessment and are working on coordinating care for her at home.

## 2018-06-04 DIAGNOSIS — E039 Hypothyroidism, unspecified: Secondary | ICD-10-CM | POA: Diagnosis not present

## 2018-06-04 DIAGNOSIS — R531 Weakness: Secondary | ICD-10-CM | POA: Diagnosis not present

## 2018-06-04 DIAGNOSIS — E43 Unspecified severe protein-calorie malnutrition: Secondary | ICD-10-CM | POA: Diagnosis not present

## 2018-06-04 DIAGNOSIS — I10 Essential (primary) hypertension: Secondary | ICD-10-CM | POA: Diagnosis not present

## 2018-06-04 DIAGNOSIS — I679 Cerebrovascular disease, unspecified: Secondary | ICD-10-CM | POA: Diagnosis not present

## 2018-06-04 DIAGNOSIS — R630 Anorexia: Secondary | ICD-10-CM | POA: Diagnosis not present

## 2018-06-05 DIAGNOSIS — I10 Essential (primary) hypertension: Secondary | ICD-10-CM | POA: Diagnosis not present

## 2018-06-05 DIAGNOSIS — I679 Cerebrovascular disease, unspecified: Secondary | ICD-10-CM | POA: Diagnosis not present

## 2018-06-05 DIAGNOSIS — R531 Weakness: Secondary | ICD-10-CM | POA: Diagnosis not present

## 2018-06-05 DIAGNOSIS — R630 Anorexia: Secondary | ICD-10-CM | POA: Diagnosis not present

## 2018-06-05 DIAGNOSIS — E039 Hypothyroidism, unspecified: Secondary | ICD-10-CM | POA: Diagnosis not present

## 2018-06-05 DIAGNOSIS — E43 Unspecified severe protein-calorie malnutrition: Secondary | ICD-10-CM | POA: Diagnosis not present

## 2018-06-15 ENCOUNTER — Emergency Department (HOSPITAL_COMMUNITY)

## 2018-06-15 ENCOUNTER — Other Ambulatory Visit: Payer: Self-pay

## 2018-06-15 ENCOUNTER — Encounter (HOSPITAL_COMMUNITY): Payer: Self-pay | Admitting: *Deleted

## 2018-06-15 ENCOUNTER — Inpatient Hospital Stay (HOSPITAL_COMMUNITY)
Admission: EM | Admit: 2018-06-15 | Discharge: 2018-06-17 | DRG: 391 | Disposition: A | Discharge status: Hospice | Attending: Pulmonary Disease | Admitting: Pulmonary Disease

## 2018-06-15 DIAGNOSIS — E860 Dehydration: Secondary | ICD-10-CM | POA: Diagnosis not present

## 2018-06-15 DIAGNOSIS — E43 Unspecified severe protein-calorie malnutrition: Secondary | ICD-10-CM | POA: Diagnosis present

## 2018-06-15 DIAGNOSIS — I10 Essential (primary) hypertension: Secondary | ICD-10-CM | POA: Diagnosis present

## 2018-06-15 DIAGNOSIS — I4891 Unspecified atrial fibrillation: Secondary | ICD-10-CM | POA: Diagnosis not present

## 2018-06-15 DIAGNOSIS — Z681 Body mass index (BMI) 19 or less, adult: Secondary | ICD-10-CM

## 2018-06-15 DIAGNOSIS — R1110 Vomiting, unspecified: Secondary | ICD-10-CM | POA: Diagnosis present

## 2018-06-15 DIAGNOSIS — Z96642 Presence of left artificial hip joint: Secondary | ICD-10-CM | POA: Diagnosis present

## 2018-06-15 DIAGNOSIS — R1111 Vomiting without nausea: Secondary | ICD-10-CM | POA: Diagnosis not present

## 2018-06-15 DIAGNOSIS — Z79890 Hormone replacement therapy (postmenopausal): Secondary | ICD-10-CM

## 2018-06-15 DIAGNOSIS — Z7901 Long term (current) use of anticoagulants: Secondary | ICD-10-CM

## 2018-06-15 DIAGNOSIS — Z66 Do not resuscitate: Secondary | ICD-10-CM | POA: Diagnosis present

## 2018-06-15 DIAGNOSIS — I445 Left posterior fascicular block: Secondary | ICD-10-CM | POA: Diagnosis present

## 2018-06-15 DIAGNOSIS — R112 Nausea with vomiting, unspecified: Secondary | ICD-10-CM | POA: Diagnosis not present

## 2018-06-15 DIAGNOSIS — K449 Diaphragmatic hernia without obstruction or gangrene: Secondary | ICD-10-CM | POA: Diagnosis not present

## 2018-06-15 DIAGNOSIS — Z4682 Encounter for fitting and adjustment of non-vascular catheter: Secondary | ICD-10-CM | POA: Diagnosis not present

## 2018-06-15 DIAGNOSIS — Z8249 Family history of ischemic heart disease and other diseases of the circulatory system: Secondary | ICD-10-CM

## 2018-06-15 DIAGNOSIS — I482 Chronic atrial fibrillation, unspecified: Secondary | ICD-10-CM | POA: Diagnosis present

## 2018-06-15 DIAGNOSIS — E039 Hypothyroidism, unspecified: Secondary | ICD-10-CM | POA: Diagnosis present

## 2018-06-15 DIAGNOSIS — C259 Malignant neoplasm of pancreas, unspecified: Secondary | ICD-10-CM | POA: Diagnosis present

## 2018-06-15 DIAGNOSIS — I491 Atrial premature depolarization: Secondary | ICD-10-CM | POA: Diagnosis not present

## 2018-06-15 DIAGNOSIS — Z79899 Other long term (current) drug therapy: Secondary | ICD-10-CM

## 2018-06-15 DIAGNOSIS — Z8673 Personal history of transient ischemic attack (TIA), and cerebral infarction without residual deficits: Secondary | ICD-10-CM

## 2018-06-15 HISTORY — DX: Malignant neoplasm of pancreas, unspecified: C25.9

## 2018-06-15 LAB — CBC WITH DIFFERENTIAL/PLATELET
Abs Immature Granulocytes: 0.04 10*3/uL (ref 0.00–0.07)
Basophils Absolute: 0 10*3/uL (ref 0.0–0.1)
Basophils Relative: 0 %
Eosinophils Absolute: 0 10*3/uL (ref 0.0–0.5)
Eosinophils Relative: 0 %
HCT: 35.7 % — ABNORMAL LOW (ref 36.0–46.0)
HEMOGLOBIN: 11.2 g/dL — AB (ref 12.0–15.0)
Immature Granulocytes: 1 %
LYMPHS ABS: 0.6 10*3/uL — AB (ref 0.7–4.0)
Lymphocytes Relative: 7 %
MCH: 29.3 pg (ref 26.0–34.0)
MCHC: 31.4 g/dL (ref 30.0–36.0)
MCV: 93.5 fL (ref 80.0–100.0)
Monocytes Absolute: 0.5 10*3/uL (ref 0.1–1.0)
Monocytes Relative: 6 %
NRBC: 0 % (ref 0.0–0.2)
Neutro Abs: 7.6 10*3/uL (ref 1.7–7.7)
Neutrophils Relative %: 86 %
Platelets: 284 10*3/uL (ref 150–400)
RBC: 3.82 MIL/uL — ABNORMAL LOW (ref 3.87–5.11)
RDW: 14.4 % (ref 11.5–15.5)
WBC: 8.8 10*3/uL (ref 4.0–10.5)

## 2018-06-15 LAB — COMPREHENSIVE METABOLIC PANEL
ALBUMIN: 3.5 g/dL (ref 3.5–5.0)
ALT: 44 U/L (ref 0–44)
ANION GAP: 12 (ref 5–15)
AST: 51 U/L — ABNORMAL HIGH (ref 15–41)
Alkaline Phosphatase: 870 U/L — ABNORMAL HIGH (ref 38–126)
BUN: 21 mg/dL (ref 8–23)
CO2: 26 mmol/L (ref 22–32)
Calcium: 8.8 mg/dL — ABNORMAL LOW (ref 8.9–10.3)
Chloride: 97 mmol/L — ABNORMAL LOW (ref 98–111)
Creatinine, Ser: 1.06 mg/dL — ABNORMAL HIGH (ref 0.44–1.00)
GFR calc Af Amer: 52 mL/min — ABNORMAL LOW (ref >60)
GFR calc non Af Amer: 45 mL/min — ABNORMAL LOW (ref >60)
Glucose, Bld: 173 mg/dL — ABNORMAL HIGH (ref 70–99)
Potassium: 3.8 mmol/L (ref 3.5–5.1)
Sodium: 135 mmol/L (ref 135–145)
Total Bilirubin: 0.8 mg/dL (ref 0.3–1.2)
Total Protein: 7.3 g/dL (ref 6.5–8.1)

## 2018-06-15 LAB — OCCULT BLOOD GASTRIC / DUODENUM (SPECIMEN CUP): Occult Blood, Gastric: NEGATIVE

## 2018-06-15 LAB — TROPONIN I: Troponin I: 0.03 ng/mL (ref <0.03)

## 2018-06-15 LAB — LIPASE, BLOOD: Lipase: 16 U/L (ref 11–51)

## 2018-06-15 MED ORDER — PROMETHAZINE HCL 25 MG/ML IJ SOLN
6.2500 mg | INTRAMUSCULAR | Status: DC | PRN
  Administered 2018-06-16: 6.25 mg via INTRAVENOUS
  Filled 2018-06-15: qty 1

## 2018-06-15 MED ORDER — SODIUM CHLORIDE 0.9 % IV SOLN
INTRAVENOUS | Status: DC
  Administered 2018-06-15: 22:00:00 via INTRAVENOUS

## 2018-06-15 NOTE — ED Notes (Signed)
Date and time results received: 06/15/18 11:09 PM  (use smartphrase ".now" to insert current time)  Test: trop Critical Value:0.03  Name of Provider Notified: [email or web address]  Orders Received? Or Actions Taken?: . none at this time.

## 2018-06-15 NOTE — ED Triage Notes (Signed)
Pt was given 4mg  zofran by ems with improvement of n/v symptoms,

## 2018-06-15 NOTE — ED Notes (Signed)
Occult card sent to lab

## 2018-06-15 NOTE — ED Provider Notes (Signed)
[facility] EMERGENCY DEPARTMENT Provider Note   CSN: [id number] Arrival date & time: 06/15/18  2058     History   Chief Complaint Chief Complaint  Patient presents with  . Emesis    HPI Regina [name] is a 85 y.o. female.  HPI  Pt was seen at 2120. Per pt's family and pt, c/o gradual onset and persistence of multiple intermittent episodes of N/V that began this afternoon PTA.   Describes the emesis as "dark." Endorses hx of similar symptoms, dx "bowel obstruction" and "needed an NGT to relieve it." Pt has significant hx of pancreatic CA and is on Hospice. Pt took her phenergan at home without improvement. Pt's Hospice RN suggested pt "crush up" her phenergan tabs, but pt was still unable to tolerate PO after this. EMS gave IV zofran en route with improvement.  Denies abd pain, no CP/SOB, no back pain, no fevers, no blood in emesis or stools.    Past Medical History:  Diagnosis Date  . Anemia   . Arthritis   . Avascular necrosis of bone of left hip ([place])   . Dysrhythmia 4/14   atrial fib post op  . H/O hiatal hernia   . History of blood transfusion   . Hypertension   . Hypothyroidism   . Pancreatic cancer ([place])   . Stroke [facility]) 2003   No deficits    Patient Active Problem List   Diagnosis Date Noted  . Goals of care, counseling/discussion   . Palliative care by specialist   . Advanced care planning/counseling discussion   . Protein-calorie malnutrition, severe 05/18/2018  . Emesis, persistent 05/18/2018  . Gastric outlet obstruction 05/17/2018  . Malignant neoplasm of pancreas ([place]) 04/01/2018  . Sepsis ([place]) 12/29/2016  . Elevated troponin 12/29/2016  . RUQ pain   . Choledochitis   . Calculus of common bile duct with obstruction   . Jaundice 11/10/2016  . Common bile duct stone 04/10/2016  . Choledocholithiasis 02/08/2016  . Surgical wound infection 10/17/2013  . Acute blood loss anemia 10/09/2013  . Constipation 10/09/2013  . Avascular necrosis of bone  of left hip ([place]) 10/03/2013  . Status post THR (total hip replacement) 10/03/2013  . Atrial fibrillation ([place]) 09/14/2012  . Hypokalemia 09/14/2012  . Fracture of femoral neck, left ([place]) 09/13/2012  . Essential hypertension, benign 09/13/2012  . Unspecified hypothyroidism 09/13/2012    Past Surgical History:  Procedure Laterality Date  . BALLOON DILATION N/A 05/05/2016   Procedure: BALLOON DILATION OF SPHINCTEROTOMY;  Surgeon: Rogene Houston, MD;  [facility];  Service: Endoscopy;  Laterality: N/A;  . ERCP N/A 03/31/2016   Procedure: ENDOSCOPIC RETROGRADE CHOLANGIOPANCREATOGRAPHY (ERCP);  Surgeon: Rogene Houston, MD;  [facility]: [facility];  Service: Endoscopy;  Laterality: N/A;  . ERCP N/A 05/05/2016   Procedure: ENDOSCOPIC RETROGRADE CHOLANGIOPANCREATOGRAPHY (ERCP);  Surgeon: Rogene Houston, MD;  [facility];  Service: Endoscopy;  Laterality: N/A;  do NOT move per [facility]  . ERCP N/A 11/11/2016   Procedure: ENDOSCOPIC RETROGRADE CHOLANGIOPANCREATOGRAPHY (ERCP);  Surgeon: Ronnette Juniper, MD;  Location: [facility];  Service: Gastroenterology;  Laterality: N/A;  PRONE POSITION  . ERCP N/A 12/29/2016   Procedure: ENDOSCOPIC RETROGRADE CHOLANGIOPANCREATOGRAPHY (ERCP) WITH STENT EXCHANGE;  Surgeon: Rogene Houston, MD;  [facility];  Service: Endoscopy;  Laterality: N/A;  . ERCP N/A 03/09/2017   Procedure: ENDOSCOPIC RETROGRADE CHOLANGIOPANCREATOGRAPHY (ERCP);  Surgeon: Rogene Houston, MD;  [facility];  Service: Endoscopy;  Laterality:  N/A;  . EYE SURGERY Right    cataract removal  . GASTROINTESTINAL STENT REMOVAL N/A 05/05/2016   Procedure: Biliary stent removal.;  Surgeon: Rogene Houston, MD;  [facility];  Service: Endoscopy;  Laterality: N/A;  . GASTROINTESTINAL STENT REMOVAL N/A 03/09/2017   Procedure: BILARY STENT REMOVAL, BILIARY STENT PLACEMENT;  Surgeon: Rogene Houston, MD;  [facility];  Service: Endoscopy;   Laterality: N/A;  . HIP PINNING,CANNULATED Left 09/13/2012   Procedure: ASNIS HIP PINNING LEFT;  Surgeon: Sanjuana Kava, MD;  Location: [facility];  Service: Orthopedics;  Laterality: Left;  . LITHOTRIPSY N/A 03/31/2016   Procedure: MECHANICAL LITHOTRIPSY;  Surgeon: Rogene Houston, MD;  [facility];  Service: Endoscopy;  Laterality: N/A;  . Mouth Sugery     Upper teeth impacted  . REMOVAL OF STONES N/A 03/31/2016   Procedure: PARTIAL RETRIEVAL OF STONE AND STENTING;  Surgeon: Rogene Houston, MD;  [facility];  Service: Endoscopy;  Laterality: N/A;  . REMOVAL OF STONES N/A 05/05/2016   Procedure: REMOVAL OF STONES;  Surgeon: Rogene Houston, MD;  [facility];  Service: Endoscopy;  Laterality: N/A;  . SPHINCTEROTOMY N/A 03/31/2016   Procedure: SPHINCTEROTOMY;  Surgeon: Rogene Houston, MD;  [facility];  Service: Endoscopy;  Laterality: N/A;  . SPYGLASS CHOLANGIOSCOPY N/A 05/05/2016   Procedure: FIEPPIRJ CHOLANGIOSCOPY;  Surgeon: Rogene Houston, MD;  [facility];  Service: Endoscopy;  Laterality: N/A;  . SPYGLASS CHOLANGIOSCOPY N/A 03/09/2017   Procedure: JOACZYSA CHOLANGIOSCOPY;  Surgeon: Rogene Houston, MD;  [facility];  Service: Endoscopy;  Laterality: N/A;  . TOTAL HIP ARTHROPLASTY Left 10/03/2013   Procedure: LEFT TOTAL HIP ARTHROPLASTY ANTERIOR APPROACH and REMOVAL OF SCREWS LEFT HIP;  Surgeon: Mcarthur Rossetti, MD;  Location: [facility];  Service: Orthopedics;  Laterality: Left;     OB History    Gravida      Para      Term      Preterm      AB      Living  2     SAB      TAB      Ectopic      Multiple      Live Births               Home Medications    Prior to Admission medications   Medication Sig Start Date End Date Taking? Authorizing Provider  acetaminophen (TYLENOL) 325 MG tablet Take 325 mg by mouth every 6 (six) hours as needed (pain/headache).     [provider]    apixaban (ELIQUIS) 2.5 MG TABS tablet Take 1 tablet (2.5 mg total) by mouth 2 (two) times daily. 03/11/17   Rogene Houston, MD  diltiazem (CARDIZEM CD) 120 MG 24 hr capsule TAKE 1 CAPSULE BY MOUTH ONCE DAILY 11/19/17   Herminio Commons, MD  levothyroxine (SYNTHROID, LEVOTHROID) 125 MCG tablet Take 125 mcg by mouth daily before breakfast.  01/12/18   [provider]  lipase/protease/amylase (CREON) 12000 units CPEP capsule Take 2 capsules (24,000 Units total) by mouth 3 (three) times daily with meals. 1 capsule with each snack.  Can have 2 snacks per day. 03/07/18   Rogene Houston, MD  promethazine (PHENERGAN) 12.5 MG tablet Take 1 tablet (12.5 mg total) by mouth every 6 (six) hours as needed for nausea or vomiting. 05/16/18   Evalee Jefferson, PA-C  Family History Family History  Problem Relation Age of Onset  . Heart attack Mother   . Heart attack Father   . Cancer Brother     Social History Social History   Tobacco Use  . Smoking status: Never Smoker  . Smokeless tobacco: Never Used  . Tobacco comment: Smoked a few times in college (socially)  Substance Use Topics  . Alcohol use: No    Alcohol/week: 0.0 standard drinks    Comment: None since Christmas 2013  . Drug use: No     Allergies   Patient has no known allergies.   Review of Systems Review of Systems ROS: Statement: All systems negative except as marked or noted in the HPI; Constitutional: Negative for fever and chills. ; ; Eyes: Negative for eye pain, redness and discharge. ; ; ENMT: Negative for ear pain, hoarseness, nasal congestion, sinus pressure and sore throat. ; ; Cardiovascular: Negative for chest pain, palpitations, diaphoresis, dyspnea and peripheral edema. ; ; Respiratory: Negative for cough, wheezing and stridor. ; ; Gastrointestinal: +N/V. Negative for diarrhea, abdominal pain, blood in stool, hematemesis, jaundice and rectal bleeding. . ; ; Genitourinary: Negative for dysuria, flank pain and  hematuria. ; ; Musculoskeletal: Negative for back pain and neck pain. Negative for swelling and trauma.; ; Skin: Negative for pruritus, rash, abrasions, blisters, bruising and skin lesion.; ; Neuro: Negative for headache, lightheadedness and neck stiffness. Negative for weakness, altered level of consciousness, altered mental status, extremity weakness, paresthesias, involuntary movement, seizure and syncope.       Physical Exam Updated Vital Signs BP (!) 157/92 (BP Location: Right Arm)   Pulse 98   Temp 98.2 F (36.8 C) (Oral)   Resp 20   Ht 5\' 4"  (1.626 m)   Wt 40.8 kg   SpO2 96%   BMI 15.45 kg/m   Physical Exam 2125: Physical examination:  Nursing notes reviewed; Vital signs and O2 SAT reviewed;  Constitutional: Thin, frail. In no acute distress; Head:  Normocephalic, atraumatic; Eyes: EOMI, PERRL, No scleral icterus; ENMT: Mouth and pharynx normal, Mucous membranes dry; Neck: Supple, Full range of motion, No lymphadenopathy; Cardiovascular: Irregular rate and rhythm, No gallop; Respiratory: Breath sounds clear & equal bilaterally, No wheezes.  Speaking full sentences with ease, Normal respiratory effort/excursion; Chest: Nontender, Movement normal; Abdomen: Soft, Nontender, Nondistended, Normal bowel sounds; Genitourinary: No CVA tenderness; Extremities: Peripheral pulses normal, No tenderness, No edema, No calf edema or asymmetry.; Neuro: AA&Ox3, Major CN grossly intact.  Speech clear. No gross focal motor or sensory deficits in extremities.; Skin: Color normal, Warm, Dry.   ED Treatments / Results  Labs (all labs ordered are listed, but only abnormal results are displayed)   EKG EKG Interpretation  Date/Time:  Saturday June 15 2018 21:35:52 EST Ventricular Rate:  94 PR Interval:    QRS Duration: 85 QT Interval:  334 QTC Calculation: 418 R Axis:   122 Text Interpretation:  Atrial fibrillation Left posterior fascicular block Anteroseptal infarct, old Borderline  repolarization abnormality Baseline wander Artifact When compared with ECG of 05/16/2018 No significant change was found Confirmed by Francine Graven [phone]) on 06/15/2018 9:54:10 PM   Radiology   Procedures Procedures (including critical care time)  Medications Ordered in ED Medications  0.9 %  sodium chloride infusion ( Intravenous New Bag/Given 06/15/18 2158)     Initial Impression / Assessment and Plan / ED Course  I have reviewed the triage vital signs and the nursing notes.  Pertinent labs & imaging results that were  available during my care of the patient were reviewed by me and considered in my medical decision making (see chart for details).  MDM Reviewed: previous chart, nursing note and vitals Reviewed previous: labs and ECG Interpretation: labs, ECG and x-ray    Results for orders placed or performed during the hospital encounter of 06/15/18  Comprehensive metabolic panel  Result Value Ref Range   Sodium 135 135 - 145 mmol/L   Potassium 3.8 3.5 - 5.1 mmol/L   Chloride 97 (L) 98 - 111 mmol/L   CO2 26 22 - 32 mmol/L   Glucose, Bld 173 (H) 70 - 99 mg/dL   BUN 21 8 - 23 mg/dL   Creatinine, Ser 1.06 (H) 0.44 - 1.00 mg/dL   Calcium 8.8 (L) 8.9 - 10.3 mg/dL   Total Protein 7.3 6.5 - 8.1 g/dL   Albumin 3.5 3.5 - 5.0 g/dL   AST 51 (H) 15 - 41 U/L   ALT 44 0 - 44 U/L   Alkaline Phosphatase 870 (H) 38 - 126 U/L   Total Bilirubin 0.8 0.3 - 1.2 mg/dL   GFR calc non Af Amer 45 (L) >60 mL/min   GFR calc Af Amer 52 (L) >60 mL/min   Anion gap 12 5 - 15  Lipase, blood  Result Value Ref Range   Lipase 16 11 - 51 U/L  Troponin I - Once  Result Value Ref Range   Troponin I 0.03 (HH) <0.03 ng/mL  CBC with Differential  Result Value Ref Range   WBC 8.8 4.0 - 10.5 K/uL   RBC 3.82 (L) 3.87 - 5.11 MIL/uL   Hemoglobin 11.2 (L) 12.0 - 15.0 g/dL   HCT 35.7 (L) 36.0 - 46.0 %   MCV 93.5 80.0 - 100.0 fL   MCH 29.3 26.0 - 34.0 pg   MCHC 31.4 30.0 - 36.0 g/dL   RDW 14.4 11.5 -  15.5 %   Platelets 284 150 - 400 K/uL   nRBC 0.0 0.0 - 0.2 %   Neutrophils Relative % 86 %   Neutro Abs 7.6 1.7 - 7.7 K/uL   Lymphocytes Relative 7 %   Lymphs Abs 0.6 (L) 0.7 - 4.0 K/uL   Monocytes Relative 6 %   Monocytes Absolute 0.5 0.1 - 1.0 K/uL   Eosinophils Relative 0 %   Eosinophils Absolute 0.0 0.0 - 0.5 K/uL   Basophils Relative 0 %   Basophils Absolute 0.0 0.0 - 0.1 K/uL   Immature Granulocytes 1 %   Abs Immature Granulocytes 0.04 0.00 - 0.07 K/uL  Occult bld gastric/duodenum (cup to lab)  Result Value Ref Range   Occult Blood, Gastric NEGATIVE NEGATIVE     Dg Abd Acute W/chest Result Date: 06/15/2018 CLINICAL DATA:  Vomiting dark emesis EXAM: DG ABDOMEN ACUTE W/ 1V CHEST COMPARISON:  CT 05/17/2018, radiographs 05/17/2018, 05/16/2018 FINDINGS: Single view chest demonstrates marked cardiomegaly. Increased retrocardiac opacity, likely due to the large hiatal hernia demonstrated on prior imaging. There may be retrocardiac consolidation as well. Aortic atherosclerosis. Supine and upright views of the abdomen demonstrate a nonobstructed gas pattern. No free air. Biliary stent in the right upper quadrant. Multiple calcified gallstones. Status post left hip replacement. Dense aortic atherosclerosis. IMPRESSION: 1. Nonobstructed gas pattern.  Gallstones. 2. Moderate cardiomegaly. Retrocardiac opacity, likely due to known hiatal hernia though left lung base atelectasis or pneumonia is not excluded. Electronically Signed   By: Donavan Foil M.D.   On: 06/15/2018 23:43    0010:  Pt continues to  c/o nausea; has had several episodes of vomiting in the ED. Gastroccult negative. H/H stable. Cr mildly elevated from baseline; judicious IVF given.  Family demanding NGT be placed "because no medications work for her nausea," "she needs the fluid suctioned out of her hiatal hernia," as well as admission.  IV zofran already given, and pt took several doses of phenergan before coming to the ED. BP  currently stable. Will re-dose phenergan. Goals of care revisited with family: pt is DNR/DNI and family requesting to "keep her comfortable."  T/C returned from [facility] Dr. Darrick Meigs, case discussed, including:  HPI, pertinent PM/SHx, VS/PE, dx testing, ED course and treatment:  Agreeable to admit.       Final Clinical Impressions(s) / ED Diagnoses   Final diagnoses:  None    ED Discharge Orders    None       Francine Graven, DO 06/20/18 1105

## 2018-06-15 NOTE — ED Triage Notes (Signed)
Pt family called ems out due to pt vomiting dark emesis that started today, has hx of pancreatic cancer,

## 2018-06-16 ENCOUNTER — Encounter (HOSPITAL_COMMUNITY): Payer: Self-pay

## 2018-06-16 ENCOUNTER — Emergency Department (HOSPITAL_COMMUNITY)

## 2018-06-16 DIAGNOSIS — R112 Nausea with vomiting, unspecified: Secondary | ICD-10-CM

## 2018-06-16 DIAGNOSIS — R1110 Vomiting, unspecified: Secondary | ICD-10-CM | POA: Diagnosis present

## 2018-06-16 DIAGNOSIS — E43 Unspecified severe protein-calorie malnutrition: Secondary | ICD-10-CM | POA: Diagnosis not present

## 2018-06-16 DIAGNOSIS — C259 Malignant neoplasm of pancreas, unspecified: Secondary | ICD-10-CM

## 2018-06-16 DIAGNOSIS — R110 Nausea: Secondary | ICD-10-CM | POA: Diagnosis not present

## 2018-06-16 DIAGNOSIS — Z4682 Encounter for fitting and adjustment of non-vascular catheter: Secondary | ICD-10-CM | POA: Diagnosis not present

## 2018-06-16 LAB — URINALYSIS, ROUTINE W REFLEX MICROSCOPIC
Bilirubin Urine: NEGATIVE
GLUCOSE, UA: NEGATIVE mg/dL
Hgb urine dipstick: NEGATIVE
Ketones, ur: 5 mg/dL — AB
Leukocytes, UA: NEGATIVE
Nitrite: NEGATIVE
PH: 5 (ref 5.0–8.0)
Protein, ur: 100 mg/dL — AB
Specific Gravity, Urine: 1.02 (ref 1.005–1.030)

## 2018-06-16 LAB — CBC
HCT: 33 % — ABNORMAL LOW (ref 36.0–46.0)
Hemoglobin: 10.2 g/dL — ABNORMAL LOW (ref 12.0–15.0)
MCH: 28.5 pg (ref 26.0–34.0)
MCHC: 30.9 g/dL (ref 30.0–36.0)
MCV: 92.2 fL (ref 80.0–100.0)
NRBC: 0 % (ref 0.0–0.2)
Platelets: 221 10*3/uL (ref 150–400)
RBC: 3.58 MIL/uL — ABNORMAL LOW (ref 3.87–5.11)
RDW: 14.4 % (ref 11.5–15.5)
WBC: 7.9 10*3/uL (ref 4.0–10.5)

## 2018-06-16 LAB — COMPREHENSIVE METABOLIC PANEL
ALBUMIN: 3.2 g/dL — AB (ref 3.5–5.0)
ALT: 35 U/L (ref 0–44)
AST: 38 U/L (ref 15–41)
Alkaline Phosphatase: 759 U/L — ABNORMAL HIGH (ref 38–126)
Anion gap: 9 (ref 5–15)
BUN: 20 mg/dL (ref 8–23)
CHLORIDE: 98 mmol/L (ref 98–111)
CO2: 31 mmol/L (ref 22–32)
Calcium: 8.5 mg/dL — ABNORMAL LOW (ref 8.9–10.3)
Creatinine, Ser: 1.11 mg/dL — ABNORMAL HIGH (ref 0.44–1.00)
GFR calc Af Amer: 50 mL/min — ABNORMAL LOW (ref >60)
GFR calc non Af Amer: 43 mL/min — ABNORMAL LOW (ref >60)
Glucose, Bld: 160 mg/dL — ABNORMAL HIGH (ref 70–99)
Potassium: 4.6 mmol/L (ref 3.5–5.1)
Sodium: 138 mmol/L (ref 135–145)
Total Bilirubin: 0.9 mg/dL (ref 0.3–1.2)
Total Protein: 6.7 g/dL (ref 6.5–8.1)

## 2018-06-16 MED ORDER — ONDANSETRON HCL 4 MG/2ML IJ SOLN: 4.0000 mg | Freq: Four times a day (QID) | INTRAMUSCULAR | Status: DC | PRN

## 2018-06-16 MED ORDER — ACETAMINOPHEN 650 MG RE SUPP: 650.0000 mg | Freq: Four times a day (QID) | RECTAL | Status: DC | PRN

## 2018-06-16 MED ORDER — ONDANSETRON HCL 4 MG PO TABS: 4.0000 mg | ORAL_TABLET | Freq: Four times a day (QID) | ORAL | Status: DC | PRN

## 2018-06-16 MED ORDER — PHENOL 1.4 % MT LIQD
1.0000 | OROMUCOSAL | Status: DC | PRN
  Filled 2018-06-16: qty 177

## 2018-06-16 MED ORDER — METOPROLOL TARTRATE 5 MG/5ML IV SOLN: 2.5000 mg | Freq: Four times a day (QID) | INTRAVENOUS | Status: DC | PRN

## 2018-06-16 MED ORDER — LEVOTHYROXINE SODIUM 100 MCG/5ML IV SOLN
50.0000 ug | Freq: Every day | INTRAVENOUS | Status: DC
  Administered 2018-06-16 – 2018-06-17 (×2): 50 ug via INTRAVENOUS
  Filled 2018-06-16 (×3): qty 5

## 2018-06-16 MED ORDER — ACETAMINOPHEN 325 MG PO TABS: 650.0000 mg | ORAL_TABLET | Freq: Four times a day (QID) | ORAL | Status: DC | PRN

## 2018-06-16 NOTE — Progress Notes (Signed)
Patient is with [facility] and the family is wondering about NG tube and low to intermittent suction at home as needed. They are wanting options to see if the hospice service can do this or a private duty nurse. Patient family is wanting as few hospital admissions as possible especially during flu season.

## 2018-06-16 NOTE — Progress Notes (Signed)
This is an assumption of care note.  She is a 85 year old with known pancreatic cancer who was in the hospital about a month ago with intractable nausea and vomiting.  She was discharged home with hospice care.  She started having trouble with nausea and vomiting again took what she had but it was not effective and eventually ended up at the emergency department and had nasogastric tube placed.  She feels better.  She is still somewhat nauseated.  She is not complaining of pain.  There was concern that she had duodenal obstruction but later evaluation looked more like her problem was from her very large hiatal hernia.  Physical exam: She is very thin.  She is frail.  Her abdomen is actually fairly soft.  She has been draining dark material from her nasogastric tube but it is negative for blood.  Her abdomen is nontender  She is had another episode of intractable nausea and vomiting.  She does not have suppositories at home and that might keep this from happening although as bad as she gets I am not sure that a suppository will be adequate to treat this.  Will discuss with hospice care.  Plan: Try clamping the nasogastric tube at around noon.  If she does well with that she can have clear liquids after.

## 2018-06-16 NOTE — ED Notes (Signed)
Placed on hospital bed

## 2018-06-16 NOTE — ED Notes (Signed)
NG placed. Pt tolerated well. Waiting on XR to verify placement

## 2018-06-16 NOTE — H&P (Signed)
TRH H&P    Patient Demographics:    Regina [name], is a 85 y.o. female  MRN: [id number]  DOB - 1923/04/06  Admit Date - 06/15/2018  Referring MD/NP/PA: Dr. Thurnell Garbe  Outpatient Primary MD for the patient is Regina Du, MD  Patient coming from: Home  Chief complaint-vomiting   HPI:    Regina [name]  is a 85 y.o. female, with a history of pancreatic cancer currently under home hospice came to hospital with intermittent episodes of nausea vomiting which began around 6 PM.  Patient described emesis as dark.  Gastric occult blood was negative patient had a similar presentation in December at that time she required NG tube placement for duodenal obstruction.  Family requested NG tube placement so NG tube was placed in the ED. Patient denies any chest pain. Today when she started having vomiting, hospice nurse was consulted and he recommended patient to be taken to the ED for further evaluation.  Apparently she did not get better with Phenergan.  She improved with Zofran which was given by EMS en route. Denies diarrhea. No abdominal pain    Review of systems:    In addition to the HPI above,    All other systems reviewed and are negative.    Past History of the following :    Past Medical History:  Diagnosis Date  . Anemia   . Arthritis   . Avascular necrosis of bone of left hip ([place])   . Dysrhythmia 4/14   atrial fib post op  . H/O hiatal hernia   . History of blood transfusion   . Hypertension   . Hypothyroidism   . Pancreatic cancer ([place])   . Stroke [facility]) 2003   No deficits      Past Surgical History:  Procedure Laterality Date  . BALLOON DILATION N/A 05/05/2016   Procedure: BALLOON DILATION OF SPHINCTEROTOMY;  Surgeon: Rogene Houston, MD;  [facility];  Service: Endoscopy;  Laterality: N/A;  . ERCP N/A 03/31/2016   Procedure: ENDOSCOPIC RETROGRADE  CHOLANGIOPANCREATOGRAPHY (ERCP);  Surgeon: Rogene Houston, MD;  [facility];  Service: Endoscopy;  Laterality: N/A;  . ERCP N/A 05/05/2016   Procedure: ENDOSCOPIC RETROGRADE CHOLANGIOPANCREATOGRAPHY (ERCP);  Surgeon: Rogene Houston, MD;  [facility];  Service: Endoscopy;  Laterality: N/A;  do NOT move per [facility]  . ERCP N/A 11/11/2016   Procedure: ENDOSCOPIC RETROGRADE CHOLANGIOPANCREATOGRAPHY (ERCP);  Surgeon: Ronnette Juniper, MD;  Location: [place];  Service: Gastroenterology;  Laterality: N/A;  PRONE POSITION  . ERCP N/A 12/29/2016   Procedure: ENDOSCOPIC RETROGRADE CHOLANGIOPANCREATOGRAPHY (ERCP) WITH STENT EXCHANGE;  Surgeon: Rogene Houston, MD;  [facility];  Service: Endoscopy;  Laterality: N/A;  . ERCP N/A 03/09/2017   Procedure: ENDOSCOPIC RETROGRADE CHOLANGIOPANCREATOGRAPHY (ERCP);  Surgeon: Rogene Houston, MD;  [facility];  Service: Endoscopy;  Laterality: N/A;  . EYE SURGERY Right    cataract removal  . GASTROINTESTINAL STENT REMOVAL N/A 05/05/2016   Procedure: Biliary stent removal.;  Surgeon: Rogene Houston, MD;  [facility];  Service: Endoscopy;  Laterality: N/A;  . GASTROINTESTINAL STENT REMOVAL N/A 03/09/2017   Procedure: BILARY STENT REMOVAL, BILIARY STENT PLACEMENT;  Surgeon: Rogene Houston, MD;  [facility];  Service: Endoscopy;  Laterality: N/A;  . HIP PINNING,CANNULATED Left 09/13/2012   Procedure: ASNIS HIP PINNING LEFT;  Surgeon: Sanjuana Kava, MD;  Location: [facility];  Service: Orthopedics;  Laterality: Left;  . LITHOTRIPSY N/A 03/31/2016   Procedure: MECHANICAL LITHOTRIPSY;  Surgeon: Rogene Houston, MD;  [facility];  Service: Endoscopy;  Laterality: N/A;  . Mouth Sugery     Upper teeth impacted  . REMOVAL OF STONES N/A 03/31/2016   Procedure: PARTIAL RETRIEVAL OF STONE AND STENTING;  Surgeon: Rogene Houston, MD;  [facility];  Service: Endoscopy;  Laterality: N/A;  .  REMOVAL OF STONES N/A 05/05/2016   Procedure: REMOVAL OF STONES;  Surgeon: Rogene Houston, MD;  [facility];  Service: Endoscopy;  Laterality: N/A;  . SPHINCTEROTOMY N/A 03/31/2016   Procedure: SPHINCTEROTOMY;  Surgeon: Rogene Houston, MD;  [facility];  Service: Endoscopy;  Laterality: N/A;  . SPYGLASS CHOLANGIOSCOPY N/A 05/05/2016   Procedure: BSJGGEZM CHOLANGIOSCOPY;  Surgeon: Rogene Houston, MD;  [facility];  Service: Endoscopy;  Laterality: N/A;  . SPYGLASS CHOLANGIOSCOPY N/A 03/09/2017   Procedure: OQHUTMLY CHOLANGIOSCOPY;  Surgeon: Rogene Houston, MD;  [facility];  Service: Endoscopy;  Laterality: N/A;  . TOTAL HIP ARTHROPLASTY Left 10/03/2013   Procedure: LEFT TOTAL HIP ARTHROPLASTY ANTERIOR APPROACH and REMOVAL OF SCREWS LEFT HIP;  Surgeon: Mcarthur Rossetti, MD;  Location[facility];  Service: Orthopedics;  Laterality: Left;      Social History:      Social History   Tobacco Use  . Smoking status: Never Smoker  . Smokeless tobacco: Never Used  . Tobacco comment: Smoked a few times in college (socially)  Substance Use Topics  . Alcohol use: No    Alcohol/week: 0.0 standard drinks    Comment: None since Christmas 2013       Family History :     Family History  Problem Relation Age of Onset  . Heart attack Mother   . Heart attack Father   . Cancer Brother       Home Medications:   Prior to Admission medications   Medication Sig Start Date End Date Taking? Authorizing Provider  acetaminophen (TYLENOL) 325 MG tablet Take 325 mg by mouth every 6 (six) hours as needed (pain/headache).     [provider]  apixaban (ELIQUIS) 2.5 MG TABS tablet Take 1 tablet (2.5 mg total) by mouth 2 (two) times daily. 03/11/17   Rogene Houston, MD  diltiazem (CARDIZEM CD) 120 MG 24 hr capsule TAKE 1 CAPSULE BY MOUTH ONCE DAILY 11/19/17   Herminio Commons, MD  levothyroxine (SYNTHROID, LEVOTHROID) 125 MCG tablet Take 125  mcg by mouth daily before breakfast.  01/12/18   [provider]  lipase/protease/amylase (CREON) 12000 units CPEP capsule Take 2 capsules (24,000 Units total) by mouth 3 (three) times daily with meals. 1 capsule with each snack.  Can have 2 snacks per day. 03/07/18   Rogene Houston, MD  promethazine (PHENERGAN) 12.5 MG tablet Take 1 tablet (12.5 mg total) by mouth every 6 (six) hours as needed for nausea or vomiting. 05/16/18   Evalee Jefferson, PA-C     Allergies:    No Known Allergies  Physical Exam:   Vitals  Blood pressure (!) 157/92, pulse 98, temperature 98.2 F (36.8 C), temperature source Oral, resp. rate 20, height 5\' 4"  (1.626 m), weight 40.8 kg, SpO2 96 %.  1.  General: Appears in no acute distress, NG tube in place  2. Psychiatric:  Intact judgement and  insight, awake alert, oriented x 3.  3. Neurologic: No focal neurological deficits, all cranial nerves intact.Strength 5/5 all 4 extremities, sensation intact all 4 extremities, plantars down going.  4. Eyes :  anicteric sclerae, moist conjunctivae with no lid lag. PERRLA.  5. ENMT:  Oropharynx clear with moist mucous membranes and good dentition  6. Neck:  supple, no cervical lymphadenopathy appriciated, No thyromegaly  7. Respiratory : Normal respiratory effort, good air movement bilaterally,clear to  auscultation bilaterally  8. Cardiovascular : RRR, no gallops, rubs or murmurs, no leg edema  9. Gastrointestinal:  Positive bowel sounds, abdomen soft, non-tender to palpation,no hepatosplenomegaly, no rigidity or guarding       10. Skin:  No cyanosis, normal texture and turgor, no rash, lesions or ulcers  11.Musculoskeletal:  Good muscle tone,  joints appear normal , no effusions,  normal range of motion    Data Review:    CBC Recent Labs  Lab 06/15/18 2201  WBC 8.8  HGB 11.2*  HCT 35.7*  PLT 284  MCV 93.5  MCH 29.3  MCHC 31.4  RDW 14.4  LYMPHSABS 0.6*  MONOABS 0.5  EOSABS 0.0    BASOSABS 0.0   ------------------------------------------------------------------------------------------------------------------  Results for orders placed or performed during the hospital encounter of 06/15/18 (from the past 48 hour(s))  Occult bld gastric/duodenum (cup to lab)     Status: None   Collection Time: 06/15/18 10:00 PM  Result Value Ref Range   Occult Blood, Gastric NEGATIVE NEGATIVE    Comment: Performed at [facility], [address], [place], [place] [address]  Comprehensive metabolic panel     Status: Abnormal   Collection Time: 06/15/18 10:01 PM  Result Value Ref Range   Sodium 135 135 - 145 mmol/L   Potassium 3.8 3.5 - 5.1 mmol/L   Chloride 97 (L) 98 - 111 mmol/L   CO2 26 22 - 32 mmol/L   Glucose, Bld 173 (H) 70 - 99 mg/dL   BUN 21 8 - 23 mg/dL   Creatinine, Ser 1.06 (H) 0.44 - 1.00 mg/dL   Calcium 8.8 (L) 8.9 - 10.3 mg/dL   Total Protein 7.3 6.5 - 8.1 g/dL   Albumin 3.5 3.5 - 5.0 g/dL   AST 51 (H) 15 - 41 U/L   ALT 44 0 - 44 U/L   Alkaline Phosphatase 870 (H) 38 - 126 U/L   Total Bilirubin 0.8 0.3 - 1.2 mg/dL   GFR calc non Af Amer 45 (L) >60 mL/min   GFR calc Af Amer 52 (L) >60 mL/min   Anion gap 12 5 - 15    Comment: Performed at [facility], [address] [place], [place] [address]  Lipase, blood     Status: None   Collection Time: 06/15/18 10:01 PM  Result Value Ref Range   Lipase 16 11 - 51 U/L    Comment: Performed at [facility], [address] [place], [place] [address]  Troponin I - Once     Status: Abnormal   Collection Time: 06/15/18 10:01 PM  Result Value Ref Range   Troponin I 0.03 (HH) <0.03 ng/mL    Comment: CRITICAL RESULT CALLED TO, READ BACK  BY AND VERIFIED WITH: NICKOLS,K @ 5885 ON 06/15/18 BY JUW Performed at [facility], [address] [place], [place] [address]   CBC with Differential     Status: Abnormal   Collection Time: 06/15/18 10:01 PM  Result Value Ref Range   WBC 8.8 4.0 - 10.5 K/uL   RBC 3.82 (L) 3.87 - 5.11  MIL/uL   Hemoglobin 11.2 (L) 12.0 - 15.0 g/dL   HCT 35.7 (L) 36.0 - 46.0 %   MCV 93.5 80.0 - 100.0 fL   MCH 29.3 26.0 - 34.0 pg   MCHC 31.4 30.0 - 36.0 g/dL   RDW 14.4 11.5 - 15.5 %   Platelets 284 150 - 400 K/uL   nRBC 0.0 0.0 - 0.2 %   Neutrophils Relative % 86 %   Neutro Abs 7.6 1.7 - 7.7 K/uL   Lymphocytes Relative 7 %   Lymphs Abs 0.6 (L) 0.7 - 4.0 K/uL   Monocytes Relative 6 %   Monocytes Absolute 0.5 0.1 - 1.0 K/uL   Eosinophils Relative 0 %   Eosinophils Absolute 0.0 0.0 - 0.5 K/uL   Basophils Relative 0 %   Basophils Absolute 0.0 0.0 - 0.1 K/uL   Immature Granulocytes 1 %   Abs Immature Granulocytes 0.04 0.00 - 0.07 K/uL    Comment: Performed at [facility], [address] [place], [place] [address]    Chemistries  Recent Labs  Lab 06/15/18 2201  NA 135  K 3.8  CL 97*  CO2 26  GLUCOSE 173*  BUN 21  CREATININE 1.06*  CALCIUM 8.8*  AST 51*  ALT 44  ALKPHOS 870*  BILITOT 0.8   ------------------------------------------------------------------------------------------------------------------  ------------------------------------------------------------------------------------------------------------------ GFR: Estimated Creatinine Clearance: 21.4 mL/min (A) (by C-G formula based on SCr of 1.06 mg/dL (H)). Liver Function Tests: Recent Labs  Lab 06/15/18 2201  AST 51*  ALT 44  ALKPHOS 870*  BILITOT 0.8  PROT 7.3  ALBUMIN 3.5   Recent Labs  Lab 06/15/18 2201  LIPASE 16   No results for input(s): AMMONIA in the last 168 hours. Coagulation Profile: No results for input(s): INR, PROTIME in the last 168 hours. Cardiac Enzymes: Recent Labs  Lab 06/15/18 2201  TROPONINI 0.03*   BNP (last 3 results) No results for input(s): PROBNP in the last 8760 hours. HbA1C: No results for input(s): HGBA1C in the last 72 hours. CBG: No results for input(s): GLUCAP in the last 168 hours. Lipid Profile: No results for input(s): CHOL, HDL, LDLCALC, TRIG,  CHOLHDL, LDLDIRECT in the last 72 hours. Thyroid Function Tests: No results for input(s): TSH, T4TOTAL, FREET4, T3FREE, THYROIDAB in the last 72 hours. Anemia Panel: No results for input(s): VITAMINB12, FOLATE, FERRITIN, TIBC, IRON, RETICCTPCT in the last 72 hours.  --------------------------------------------------------------------------------------------------------------- Urine analysis:    Component Value Date/Time   COLORURINE AMBER (A) 12/29/2016 1526   APPEARANCEUR HAZY (A) 12/29/2016 1526   LABSPEC 1.021 12/29/2016 1526   PHURINE 5.0 12/29/2016 1526   GLUCOSEU NEGATIVE 12/29/2016 1526   HGBUR SMALL (A) 12/29/2016 1526   BILIRUBINUR MODERATE (A) 12/29/2016 1526   KETONESUR NEGATIVE 12/29/2016 1526   PROTEINUR 100 (A) 12/29/2016 1526   UROBILINOGEN 0.2 09/25/2013 1402   NITRITE NEGATIVE 12/29/2016 1526   LEUKOCYTESUR NEGATIVE 12/29/2016 1526      Imaging Results:    Dg Chest Portable 1 View  Result Date: 06/16/2018 CLINICAL DATA:  Vomiting today. History of pancreatic cancer. NG tube placement EXAM: PORTABLE CHEST 1 VIEW COMPARISON:  06/15/2018 FINDINGS: Enteric tube has  been placed. Tip projects over the lower chest, likely within a large esophageal hiatal hernia. Cardiac enlargement. Lungs are clear. No blunting of costophrenic angles. No pneumothorax. Calcified and tortuous aorta. IMPRESSION: Enteric tube tip projects over the lower chest, likely within a large esophageal hiatal hernia. Electronically Signed   By: Lucienne Capers M.D.   On: 06/16/2018 00:34   Dg Abd Acute W/chest  Result Date: 06/15/2018 CLINICAL DATA:  Vomiting dark emesis EXAM: DG ABDOMEN ACUTE W/ 1V CHEST COMPARISON:  CT 05/17/2018, radiographs 05/17/2018, 05/16/2018 FINDINGS: Single view chest demonstrates marked cardiomegaly. Increased retrocardiac opacity, likely due to the large hiatal hernia demonstrated on prior imaging. There may be retrocardiac consolidation as well. Aortic atherosclerosis.  Supine and upright views of the abdomen demonstrate a nonobstructed gas pattern. No free air. Biliary stent in the right upper quadrant. Multiple calcified gallstones. Status post left hip replacement. Dense aortic atherosclerosis. IMPRESSION: 1. Nonobstructed gas pattern.  Gallstones. 2. Moderate cardiomegaly. Retrocardiac opacity, likely due to known hiatal hernia though left lung base atelectasis or pneumonia is not excluded. Electronically Signed   By: Donavan Foil M.D.   On: 06/15/2018 23:43    My personal review of EKG: Rhythm atrial fibrillation   Assessment & Plan:    Active Problems:   Vomiting   1. Intractable nausea vomiting-abdominal x-ray showed nonobstructive gas pattern.  NG tube has been inserted as per family request.  Continue Zofran PRN for nausea and vomiting.  2. Pancreatic cancer-patient is on comfort measures only on hospice.  3. Hypothyroidism-we will start with IV Synthroid 50 mcg daily.  4. Atrial fibrillation-we will hold Cardizem and Eliquis.  Start PRN IV metoprolol.    DVT Prophylaxis-   SCDs  AM Labs Ordered, also please review Full Orders  Family Communication: Admission, patients condition and plan of care including tests being ordered have been discussed with the patient and her family members at bedside* who indicate understanding and agree with the plan and Code Status.  Code Status: DNR  Admission status: Observation: Based on patients clinical presentation and evaluation of above clinical data, I have made determination that patient will need less than 2 midnight stay in the hospital  Time spent in minutes : 60 minutes   Oswald Hillock M.D on 06/16/2018 at 1:26 AM  Between 7am to 7pm - Pager - [phone]. After 7pm go to [email or web address] - password [facility]   [facility] - Office  [phone]

## 2018-06-16 NOTE — ED Notes (Signed)
Tube advanced per md. NG to LIMS, toleratin well

## 2018-06-16 NOTE — ED Notes (Signed)
Assisted patient to restroom with two assist. Tolerated well. Pt back in bed. Urine sample sent to lab

## 2018-06-16 NOTE — ED Notes (Addendum)
NG tube clamped per Dr Luan Pulling order

## 2018-06-17 DIAGNOSIS — Z8673 Personal history of transient ischemic attack (TIA), and cerebral infarction without residual deficits: Secondary | ICD-10-CM | POA: Diagnosis not present

## 2018-06-17 DIAGNOSIS — R531 Weakness: Secondary | ICD-10-CM | POA: Diagnosis not present

## 2018-06-17 DIAGNOSIS — R630 Anorexia: Secondary | ICD-10-CM | POA: Diagnosis not present

## 2018-06-17 DIAGNOSIS — E039 Hypothyroidism, unspecified: Secondary | ICD-10-CM | POA: Diagnosis not present

## 2018-06-17 DIAGNOSIS — I679 Cerebrovascular disease, unspecified: Secondary | ICD-10-CM | POA: Diagnosis not present

## 2018-06-17 DIAGNOSIS — R1110 Vomiting, unspecified: Secondary | ICD-10-CM | POA: Diagnosis present

## 2018-06-17 DIAGNOSIS — K449 Diaphragmatic hernia without obstruction or gangrene: Secondary | ICD-10-CM | POA: Diagnosis present

## 2018-06-17 DIAGNOSIS — I482 Chronic atrial fibrillation, unspecified: Secondary | ICD-10-CM | POA: Diagnosis present

## 2018-06-17 DIAGNOSIS — I445 Left posterior fascicular block: Secondary | ICD-10-CM | POA: Diagnosis present

## 2018-06-17 DIAGNOSIS — Z681 Body mass index (BMI) 19 or less, adult: Secondary | ICD-10-CM | POA: Diagnosis not present

## 2018-06-17 DIAGNOSIS — Z8249 Family history of ischemic heart disease and other diseases of the circulatory system: Secondary | ICD-10-CM | POA: Diagnosis not present

## 2018-06-17 DIAGNOSIS — Z79899 Other long term (current) drug therapy: Secondary | ICD-10-CM | POA: Diagnosis not present

## 2018-06-17 DIAGNOSIS — I10 Essential (primary) hypertension: Secondary | ICD-10-CM | POA: Diagnosis not present

## 2018-06-17 DIAGNOSIS — Z7901 Long term (current) use of anticoagulants: Secondary | ICD-10-CM | POA: Diagnosis not present

## 2018-06-17 DIAGNOSIS — C259 Malignant neoplasm of pancreas, unspecified: Secondary | ICD-10-CM | POA: Diagnosis present

## 2018-06-17 DIAGNOSIS — R110 Nausea: Secondary | ICD-10-CM | POA: Diagnosis not present

## 2018-06-17 DIAGNOSIS — Z79890 Hormone replacement therapy (postmenopausal): Secondary | ICD-10-CM | POA: Diagnosis not present

## 2018-06-17 DIAGNOSIS — R112 Nausea with vomiting, unspecified: Secondary | ICD-10-CM | POA: Diagnosis present

## 2018-06-17 DIAGNOSIS — E43 Unspecified severe protein-calorie malnutrition: Secondary | ICD-10-CM | POA: Diagnosis not present

## 2018-06-17 DIAGNOSIS — Z96642 Presence of left artificial hip joint: Secondary | ICD-10-CM | POA: Diagnosis present

## 2018-06-17 DIAGNOSIS — Z66 Do not resuscitate: Secondary | ICD-10-CM | POA: Diagnosis present

## 2018-06-17 MED ORDER — PROMETHAZINE HCL 12.5 MG RE SUPP: 12.5000 mg | Freq: Four times a day (QID) | RECTAL | 5 refills | Status: AC | PRN

## 2018-06-17 NOTE — Progress Notes (Signed)
Discharge instructions reviewed with patient and family.  #88fr NG tube sent with family per hospice request.  Patient and family verbalized understanding of instructions. Patient discharged home with family in stable condition.

## 2018-06-17 NOTE — Care Management (Signed)
Patient Information   Patient Name Regina [name], Regina [name] ([id number]) Sex Female DOB 09/12/1922  Room Bed  A311 A311-01  Patient Demographics   Address [place] [address] Phone [phone] (Home) *Preferred* E-mail Address [email or web address]  Patient Ethnicity & Race   Ethnic Group Patient Race  Not Hispanic or Latino White or Caucasian  Emergency Contact(s)   Name Relation Home Work Mobile  Regina [name] Son [phone]  [phone]  [facility] Daughter [phone]    Regina [name] Other [phone]  [phone]  Regina [name] Other   [id number]  Documents on File    Status Date Received Description  Documents for the Patient  EMR Patient Summary Not Received    [place] Not Received    [place] E-Signature HIPAA Notice of Privacy Received 70/26/37   Driver's License Not Received    Insurance Card Received 10/09/13 [facility]  Advance Directives/Living Will/HCPOA/POA Received 03/31/16 LIVING WILL  Insurance Card Received 08/16/15 MEDICARE  Insurance Card Not Received    [place] E-Signature HIPAA Notice of Privacy [place]     Advanced Beneficiary Notice (ABN) Not Received    Insurance Card Received 10/09/13 MEDICARE  Advance Directives/Living Will/HCPOA/POA  10/09/13   Other Photo ID Received 03/27/16   Release of Information Received 08/16/15 DPR Signed on 3.13.17 / Cancellation - No Show Policy [facility]  HIM ROI Authorization  04/03/16 [facility]--03/31/16 ERCP op note to PCP Regina [name])  HIM ROI Authorization  07/13/16 [facility]--07/10/16 labs to PCP Regina [name])  Advance Directives/Living Will/HCPOA/POA  11/14/16   Advance Directives/Living Will/HCPOA/POA  11/14/16   Insurance Card Received 12/14/16 MEDICARE/[facility]/HHS  HIM ROI Authorization  12/14/16 [facility]--12/11/16 labs to PCP (hawkins)  AMB Correspondence  01/05/17 LETTER [facility] Card Received 02/13/17 MEDICARE/[facility]CGD/HHS  HIM ROI  Authorization  02/19/17 [facility]--02/13/17 labs to PCP Regina [name])  HIM ROI Authorization  03/08/17 [facility]--03/05/17 preop labs to PCP Regina [name])  HIM ROI Authorization  05/10/17 [facility]--05/03/17 labs to PCP (hawkins)  AMB Correspondence Received 06/14/17 Regina Gaul MD , E  Insurance Card Received 04/01/18   [place] E-Signature HIPAA Notice of Privacy Signed 06/15/18   HIM Release of Information Output (Deleted) 05/10/17 Orders/Results Individual  Documents for the Encounter  AOB (Assignment of Insurance Benefits) Not Received    E-signature AOB Signed 06/15/18   MEDICARE RIGHTS Not Received    E-signature Medicare Rights Signed 06/15/18   Admission Information   Current Information   Attending Provider Admitting Provider Admission Type Admission Status  Regina Du, MD Regina Hillock, MD  Admission (Confirmed)       Admission Date/Time Discharge Date Hospital Service Auth/Cert Status  85/88/50 08:58 PM  Internal Medicine [place] Unit Room/Bed   [facility] AP-DEPT 300 A311/A311-01        Admission   Complaint  Emesis  Hospital Account   Name Acct ID Class Status Primary Coverage  Regina [name] [id number] [facility]      Guarantor Account (for [place] [id number])   Name Relation to [place]? Acct Type  Regina [name] Yes Personal/Family  Address Phone    [name], [name] [address] [phone])        Coverage Information (for Hospital Account [id number])   F/O Payor/Plan Precert #  Council #  Regina [name] [id number]  Address Phone  [place], [place] [address] [phone]

## 2018-06-17 NOTE — Progress Notes (Signed)
She feels much better.  No more nausea or vomiting.  Nasogastric tube is been clamped now for almost 24 hours and she is been able to tolerate liquids.  Family wants to know nasogastric tube could be inserted at home and I am not sure if hospice is able to provide that or not.  I will investigate.  In the meantime we will go ahead and give her suppositories but I think she is okay for discharge

## 2018-06-17 NOTE — Care Management (Addendum)
Call placed to [facility]) to see if they can provide NG tube at home.   ADDENDUM: Per Cassandra at [facility], they are willing to insert/manage NGT at home.  Updated family .  [facility] to call family to discuss and go over details.  Bedside nurse to send NGT home with patient until hospice can order stock.

## 2018-06-17 NOTE — Discharge Summary (Signed)
Physician Discharge Summary  Patient ID: Regina [name] MRN: [id number] DOB/AGE: [id number] y.o. Primary Care Physician:Krystena Reitter, Percell Miller, MD Admit date: 06/15/2018 Discharge date: 06/17/2018    Discharge Diagnoses:   Active Problems:   Vomiting Pancreatic cancer Chronic atrial fib Chronic anticoagulation Malnutrition Hypothyroidism Pancreatic insufficiency  Allergies as of 06/17/2018   No Known Allergies     Medication List    TAKE these medications   acetaminophen 325 MG tablet Commonly known as:  TYLENOL Take 325 mg by mouth every 6 (six) hours as needed (pain/headache).   apixaban 2.5 MG Tabs tablet Commonly known as:  ELIQUIS Take 1 tablet (2.5 mg total) by mouth 2 (two) times daily.   diltiazem 120 MG 24 hr capsule Commonly known as:  CARDIZEM CD TAKE 1 CAPSULE BY MOUTH ONCE DAILY   levothyroxine 125 MCG tablet Commonly known as:  SYNTHROID, LEVOTHROID Take 125 mcg by mouth daily before breakfast.   lipase/protease/amylase 12000 units Cpep capsule Commonly known as:  CREON Take 2 capsules (24,000 Units total) by mouth 3 (three) times daily with meals. 1 capsule with each snack.  Can have 2 snacks per day.   promethazine 12.5 MG tablet Commonly known as:  PHENERGAN Take 1 tablet (12.5 mg total) by mouth every 6 (six) hours as needed for nausea or vomiting. What changed:  Another medication with the same name was added. Make sure you understand how and when to take each.   promethazine 12.5 MG suppository Commonly known as:  PHENERGAN Place 1 suppository (12.5 mg total) rectally every 6 (six) hours as needed for nausea or vomiting. Use for nausea and vomiting unable to be controlled with oral meds What changed:  You were already taking a medication with the same name, and this prescription was added. Make sure you understand how and when to take each.       Discharged Condition: Improved    Consults: None  Significant Diagnostic Studies: Dg Chest  Portable 1 View  Result Date: 06/16/2018 CLINICAL DATA:  Vomiting today. History of pancreatic cancer. NG tube placement EXAM: PORTABLE CHEST 1 VIEW COMPARISON:  06/15/2018 FINDINGS: Enteric tube has been placed. Tip projects over the lower chest, likely within a large esophageal hiatal hernia. Cardiac enlargement. Lungs are clear. No blunting of costophrenic angles. No pneumothorax. Calcified and tortuous aorta. IMPRESSION: Enteric tube tip projects over the lower chest, likely within a large esophageal hiatal hernia. Electronically Signed   By: Lucienne Capers M.D.   On: 06/16/2018 00:34   Dg Abd Acute W/chest  Result Date: 06/15/2018 CLINICAL DATA:  Vomiting dark emesis EXAM: DG ABDOMEN ACUTE W/ 1V CHEST COMPARISON:  CT 05/17/2018, radiographs 05/17/2018, 05/16/2018 FINDINGS: Single view chest demonstrates marked cardiomegaly. Increased retrocardiac opacity, likely due to the large hiatal hernia demonstrated on prior imaging. There may be retrocardiac consolidation as well. Aortic atherosclerosis. Supine and upright views of the abdomen demonstrate a nonobstructed gas pattern. No free air. Biliary stent in the right upper quadrant. Multiple calcified gallstones. Status post left hip replacement. Dense aortic atherosclerosis. IMPRESSION: 1. Nonobstructed gas pattern.  Gallstones. 2. Moderate cardiomegaly. Retrocardiac opacity, likely due to known hiatal hernia though left lung base atelectasis or pneumonia is not excluded. Electronically Signed   By: Donavan Foil M.D.   On: 06/15/2018 23:43   Dg Duanne Limerick  W/kub  Result Date: 05/20/2018 CLINICAL DATA:  Pancreatic cancer, biliary obstruction post stenting, known hiatal hernia, gastric outlet obstruction EXAM: WATER SOLUBLE UPPER GI SERIES TECHNIQUE: Single-column upper GI series was  performed using water soluble contrast. CONTRAST:  300 cc of Isovue-300 COMPARISON:  CT abdomen and pelvis 05/17/2018 FLUOROSCOPY TIME:  Fluoroscopy Time:  3 minutes 24 seconds  Radiation Exposure Index (if provided by the fluoroscopic device): 48.4 mGy Number of Acquired Spot Images: 1 plus multiple fluoroscopic screen captures FINDINGS: Nonobstructive bowel gas pattern. CBD wall stent RIGHT upper quadrant. Bones demineralized. Atherosclerotic calcifications aorta and iliac arteries. Contrast was administered via indwelling nasogastric tube. Large hiatal hernia with the majority of the stomach located in the inferior mediastinum, with greater curve located cranially consistent with organo-axial volvulus. Small amount of reflux of contrast into the distal esophagus was noted. No gross evidence of gastric mass or ulcer. Rugal fold of grossly normal thickness. Elongation of distal gastric antrum and pylorus across the diaphragm into the upper abdomen. First and proximal second portions of the duodenum appear elongated and effaced, likely related to mass effect and question involvement from known pancreatic tumor. Mild thickening of folds at the distal second portion of the duodenum is also seen. Third portion of the duodenum shows relative narrowing at the segment that traverses the spine but is otherwise nonobstructed. Contrast is present within small bowel loops in the LEFT upper quadrant LEFT mid abdomen. IMPRESSION: Mass effect upon the first and second portions of the duodenum with effacement of mucosal folds likely related to known pancreatic tumor. Fold thickening at the distal second portion of the duodenum as well. No evidence of duodenal obstruction or gastric outlet obstruction. Large hiatal hernia with majority of stomach in chest, flipped with greater curve located cranially, consistent with organoaxial volvulus. Electronically Signed   By: Lavonia Dana M.D.   On: 05/20/2018 13:41    Lab Results: Basic Metabolic Panel: Recent Labs    06/15/18 2201 06/16/18 0500  NA 135 138  K 3.8 4.6  CL 97* 98  CO2 26 31  GLUCOSE 173* 160*  BUN 21 20  CREATININE 1.06* 1.11*   CALCIUM 8.8* 8.5*   Liver Function Tests: Recent Labs    06/15/18 2201 06/16/18 0500  AST 51* 38  ALT 44 35  ALKPHOS 870* 759*  BILITOT 0.8 0.9  PROT 7.3 6.7  ALBUMIN 3.5 3.2*     CBC: Recent Labs    06/15/18 2201 06/16/18 0500  WBC 8.8 7.9  NEUTROABS 7.6  --   HGB 11.2* 10.2*  HCT 35.7* 33.0*  MCV 93.5 92.2  PLT 284 221    No results found for this or any previous visit (from the past 240 hour(s)).   Hospital Course: This is a 85 year old who has known pancreatic cancer and who is doing hospice care at home.  She developed increased nausea and vomiting which became intractable and was directed to come to the emergency department.  In the emergency department she had nasogastric tube placed with production of a large volume of gastric secretions.  She felt much better after the removal of secretions and her nasogastric tube was clamped and then she was able to manage clear liquids.  She is going to be discharged back home in care of hospice on suppositories and I am going to see if it is possible for them to place nasogastric tube if needed at home.  Discharge Exam: Blood pressure 127/72, pulse 87, temperature (!) 97.4 F (36.3 C), temperature source Oral, resp. rate 18, height 5\' 4"  (1.626 m), weight 36.1 kg, SpO2 98 %. She is very thin.  She is awake and alert.  Her abdomen is soft  with normal bowel sounds  Disposition: Home with hospice care      Signed: Alonza Bogus   06/17/2018, 8:31 AM

## 2018-06-17 NOTE — Care Management (Signed)
Care order/instruction: Hospice order : Insert NGT for intractable nausea/vomiting to low intermittent suction (Order [id number])  Nursing  Date: 06/17/2018 Department: [facility] Ordering/Authorizing: Sinda Du, MD   Sinda Du, MD NPI: [id number]    Patient Information   Patient Name Regina [name], Regina [name] Sex Female DOB 09-18-1922 SSN [id number]  Order Information   Order Date/Time Release Date/Time Start Date/Time End Date/Time  06/17/18 11:14 AM None 06/17/18 11:07 AM 06/17/18 11:07 AM  Order History  Inpatient  Date/Time Action Taken User Additional Information  06/17/18 1114 Sign Tayia Stonesifer, Sandy Salaam, RN Ordering Mode: Verbal with Read Back: Cosign Required  06/17/18 1114 Release Instance Issiah Huffaker, Sandy Salaam, RN (auto-released) Released Order: [id number]  06/17/18 1133 Acknowledge Nelson Chimes, RN New Order  06/17/18 1338 Verbal Camie Patience, MD   Comments   Hospice order : Insert NGT for intractable nausea/vomiting to low intermittent suction      Standing Order Information   Remaining Occurrences Interval Last Released     0/1 Once 06/17/2018         Collection Information    Encounter   View Encounter        Verbal Order Info   Action Created on Order Mode Entered by Comment Responsible Provider Signed by Signed on  Ordering 06/17/18 1114 Verbal with Read Back: Cosign Required Neftaly Swiss, Sandy Salaam, RN  Sinda Du, MD Sinda Du, MD 06/17/18 1338  Tracking Links    Cosign Tracking Order Transmittal Tracking

## 2018-06-17 NOTE — Plan of Care (Signed)
Patient discharged home with home health and hospice care.

## 2018-06-24 ENCOUNTER — Ambulatory Visit (HOSPITAL_COMMUNITY): Payer: Self-pay | Admitting: Hematology

## 2018-06-24 ENCOUNTER — Other Ambulatory Visit (HOSPITAL_COMMUNITY): Payer: Self-pay

## 2018-07-06 DEATH — deceased

## 2020-06-02 IMAGING — RF DG UGI W/ KUB
12 of 16 series · 14 of 24 positions shown · IV contrast (agent unspecified)
Comparison: CT abdomen and pelvis 05/17/2018

CLINICAL DATA: Pancreatic cancer, biliary obstruction post
stenting, known hiatal hernia, gastric outlet obstruction

EXAM:
WATER SOLUBLE UPPER GI SERIES
TECHNIQUE: Single-column upper GI series was performed using water soluble
contrast.
CONTRAST:  300 cc of 8sovue-255

[Series 2: cp_standard · 0.19mm/px · 1 of 1 slices shown (1 of 12)]
[im 1/1]
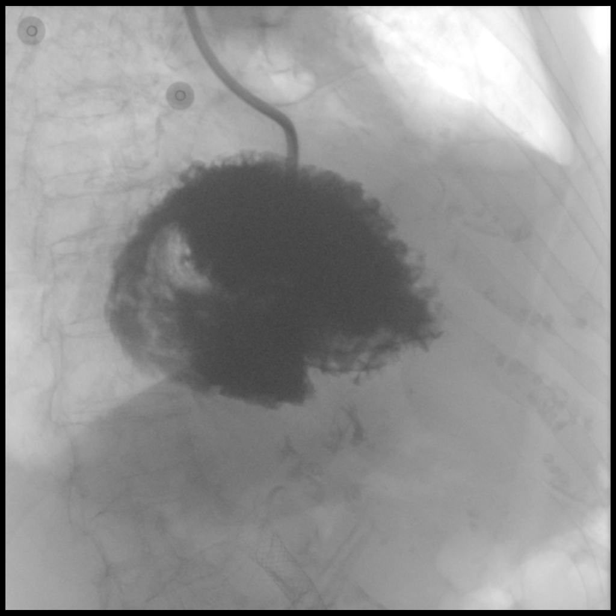

[Series 5: cp_standard · 0.19mm/px · 1 of 1 slices shown (2 of 12)]
[im 1/1]
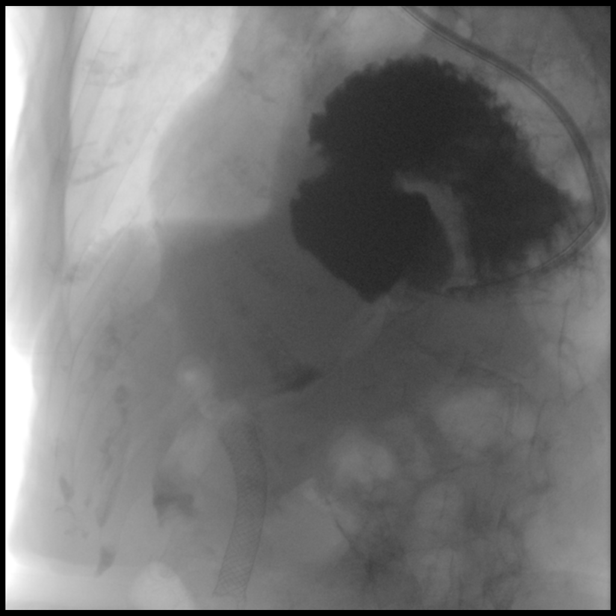

[Series 6: cp_standard · 0.19mm/px · 1 of 32 frames shown (3 of 12)]
[frame 17/32]
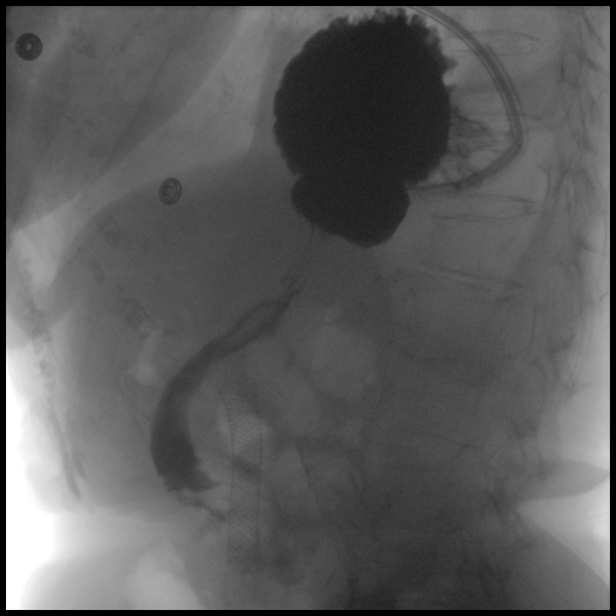

[Series 7: cp_standard · 0.19mm/px · 2 of 81 frames shown (4 of 12)]
[frame 32/81]
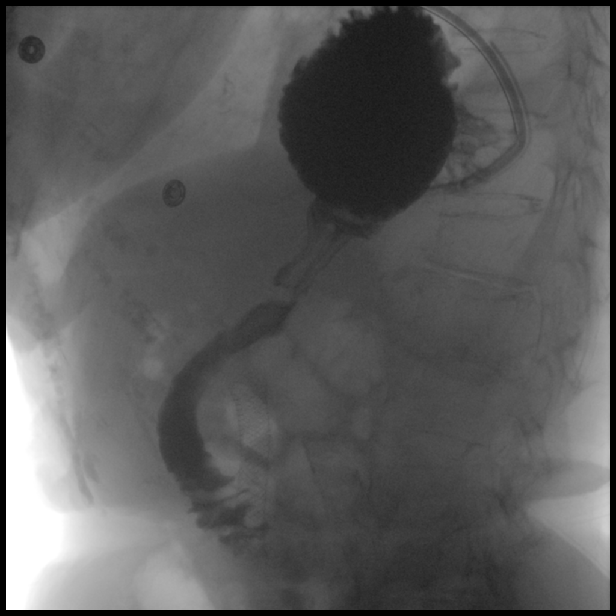
[frame 69/81]
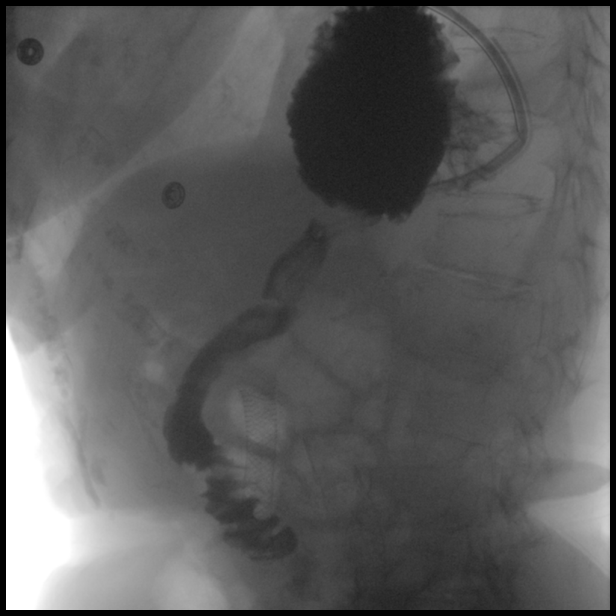

[Series 8: cp_standard · 0.19mm/px · 1 of 38 frames shown (5 of 12)]
[frame 20/38]
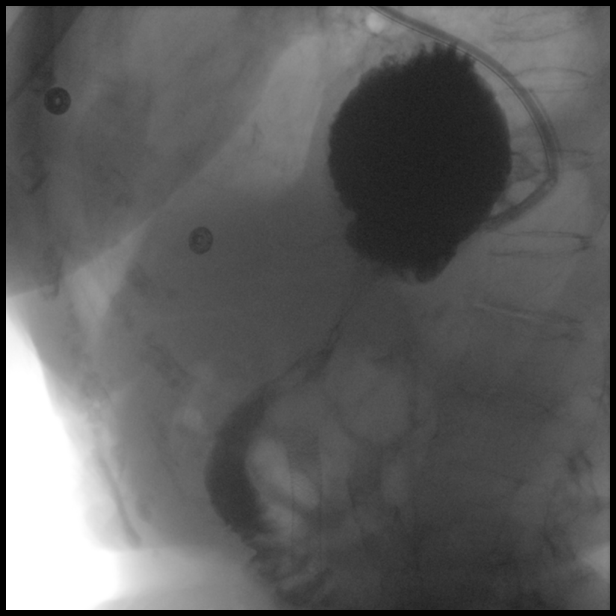

[Series 10: cp_standard · 0.20mm/px · 2 of 49 frames shown (6 of 12)]
[frame 1/49]
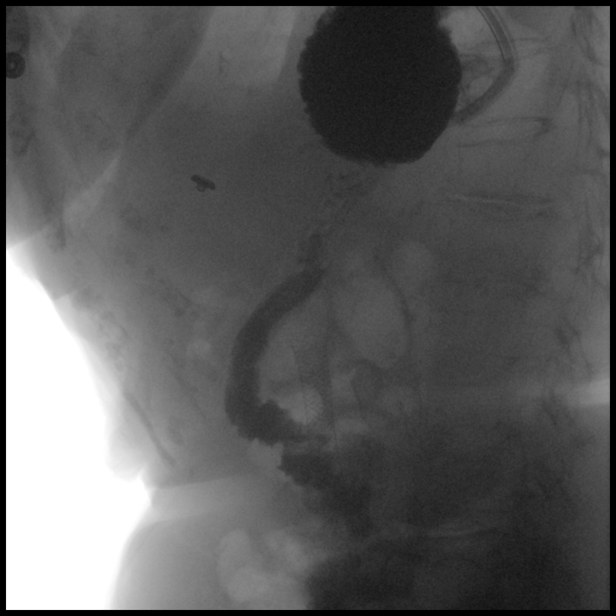
[frame 25/49]
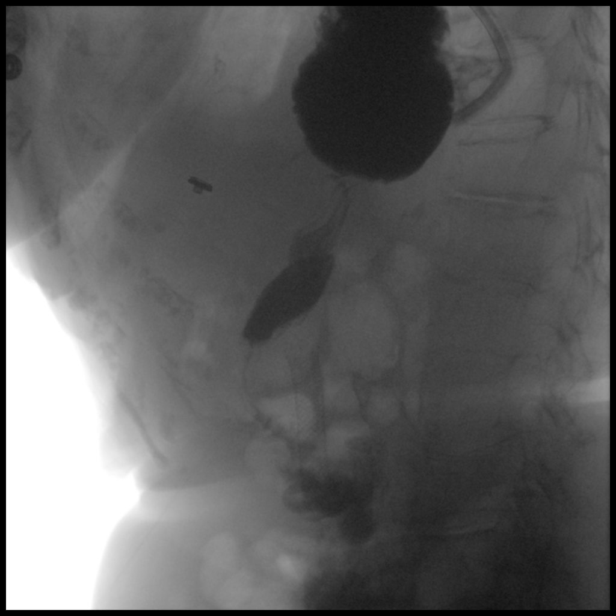

[Series 12: cp_standard · 0.20mm/px · 1 of 1 slices shown (7 of 12)]
[im 1/1]
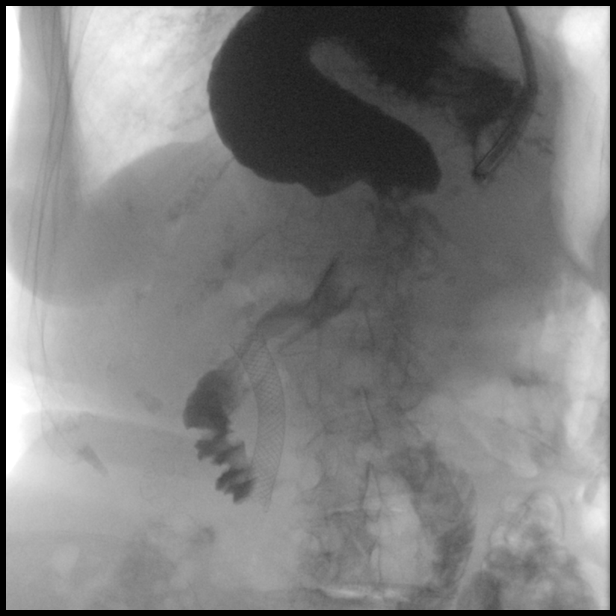

[Series 15: cp_standard · 0.20mm/px · 1 of 1 slices shown (8 of 12)]
[im 1/1]
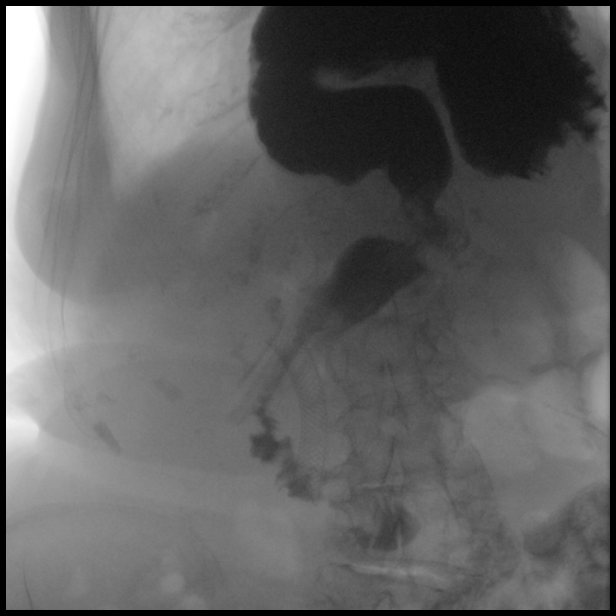

[Series 17: cp_standard · 0.20mm/px · 1 of 1 slices shown (9 of 12)]
[im 1/1]
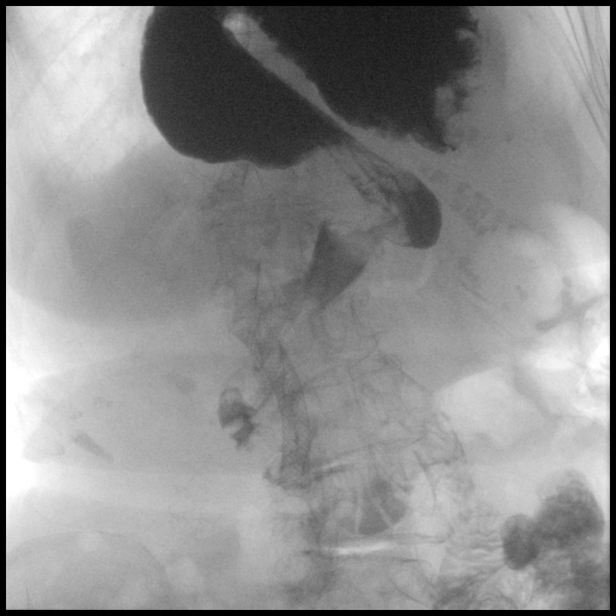

[Series 19: cp_standard · 0.20mm/px · 1 of 1 slices shown (10 of 12)]
[im 1/1]
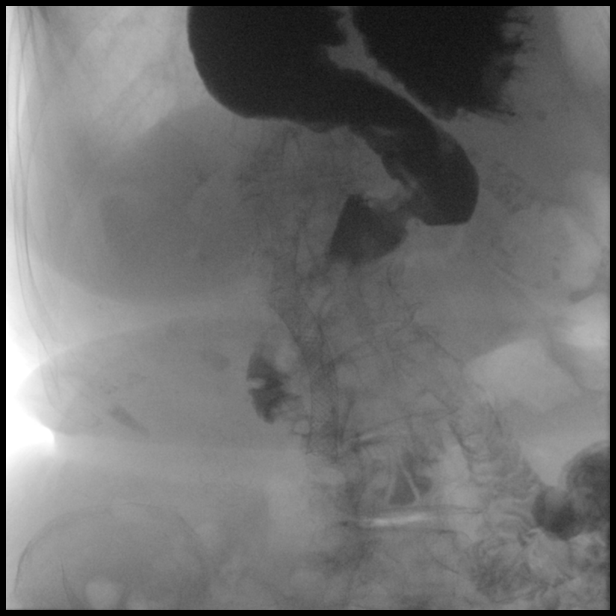

[Series 20: cp_standard · 0.20mm/px · 1 of 75 frames shown (11 of 12)]
[frame 38/75]
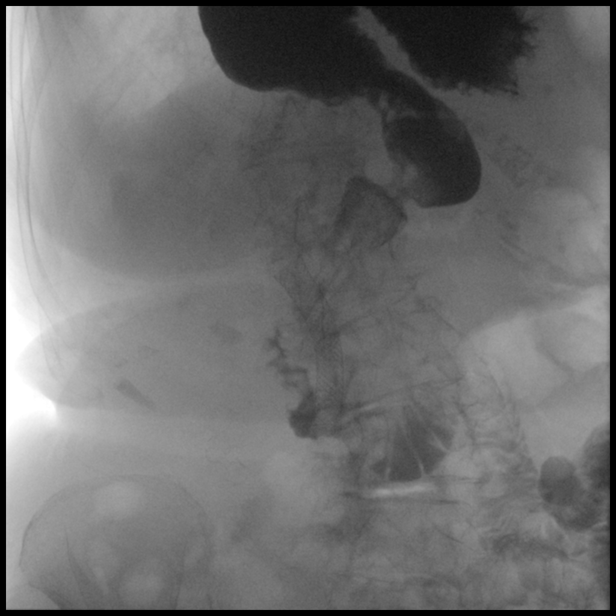

[Series 22: cp_standard · 0.30mm/px · 1 of 1 slices shown (12 of 12)]
[im 1/1]
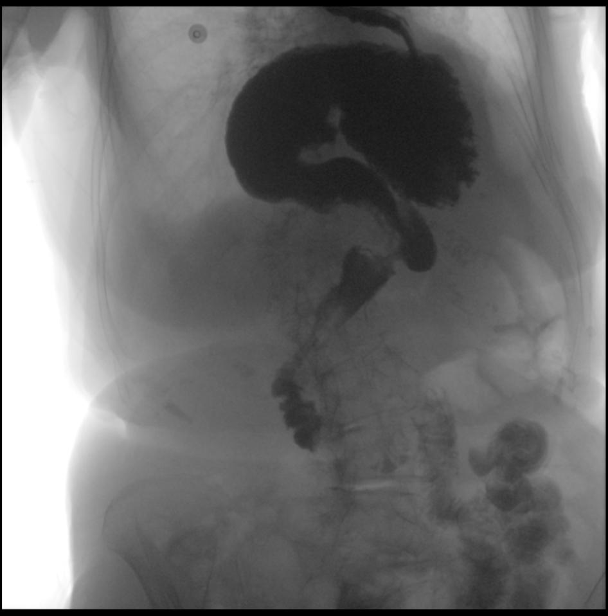

[14 of 24 positions shown; findings below may reference images not displayed]

FLUOROSCOPY TIME:  Fluoroscopy Time:  3 minutes 24 seconds

Radiation Exposure Index (if provided by the fluoroscopic device):
48.4 mGy

Number of Acquired Spot Images: 1 plus multiple fluoroscopic screen
captures
FINDINGS: Nonobstructive bowel gas pattern.

CBD wall stent RIGHT upper quadrant.

Bones demineralized.

Atherosclerotic calcifications aorta and iliac arteries.

Contrast was administered via indwelling nasogastric tube.

Large hiatal hernia with the majority of the stomach located in the
inferior mediastinum, with greater curve located cranially
consistent with organo-axial volvulus.

Small amount of reflux of contrast into the distal esophagus was
noted.

No gross evidence of gastric mass or ulcer.

Rugal fold of grossly normal thickness.

Elongation of distal gastric antrum and pylorus across the diaphragm
into the upper abdomen.

First and proximal second portions of the duodenum appear elongated
and effaced, likely related to mass effect and question involvement
from known pancreatic tumor.

Mild thickening of folds at the distal second portion of the
duodenum is also seen.

Third portion of the duodenum shows relative narrowing at the
segment that traverses the spine but is otherwise nonobstructed.

Contrast is present within small bowel loops in the LEFT upper
quadrant LEFT mid abdomen.
IMPRESSION: Mass effect upon the first and second portions of the duodenum with
effacement of mucosal folds likely related to known pancreatic
tumor.

Fold thickening at the distal second portion of the duodenum as
well.

No evidence of duodenal obstruction or gastric outlet obstruction.

Large hiatal hernia with majority of stomach in chest, flipped with
greater curve located cranially, consistent with organoaxial
volvulus.
